# Patient Record
Sex: Male | Born: 1965 | Race: White | Hispanic: No | Marital: Married | Smoking: Former smoker
Health system: Southern US, Community
[De-identification: ages and names within clinical notes are randomized; demographics above are authoritative.]

## PROBLEM LIST (undated history)

## (undated) DIAGNOSIS — F419 Anxiety disorder, unspecified: Secondary | ICD-10-CM

## (undated) DIAGNOSIS — M48062 Spinal stenosis, lumbar region with neurogenic claudication: Secondary | ICD-10-CM

## (undated) DIAGNOSIS — F32A Depression, unspecified: Secondary | ICD-10-CM

## (undated) DIAGNOSIS — F329 Major depressive disorder, single episode, unspecified: Secondary | ICD-10-CM

## (undated) DIAGNOSIS — M5416 Radiculopathy, lumbar region: Secondary | ICD-10-CM

## (undated) DIAGNOSIS — M545 Low back pain, unspecified: Secondary | ICD-10-CM

## (undated) DIAGNOSIS — R55 Syncope and collapse: Secondary | ICD-10-CM

## (undated) DIAGNOSIS — Z950 Presence of cardiac pacemaker: Secondary | ICD-10-CM

## (undated) DIAGNOSIS — M1990 Unspecified osteoarthritis, unspecified site: Secondary | ICD-10-CM

## (undated) DIAGNOSIS — M25512 Pain in left shoulder: Secondary | ICD-10-CM

## (undated) DIAGNOSIS — M25522 Pain in left elbow: Secondary | ICD-10-CM

## (undated) DIAGNOSIS — R001 Bradycardia, unspecified: Secondary | ICD-10-CM

## (undated) DIAGNOSIS — M51369 Other intervertebral disc degeneration, lumbar region without mention of lumbar back pain or lower extremity pain: Secondary | ICD-10-CM

## (undated) DIAGNOSIS — M25521 Pain in right elbow: Secondary | ICD-10-CM

## (undated) DIAGNOSIS — M5136 Other intervertebral disc degeneration, lumbar region: Secondary | ICD-10-CM

## (undated) HISTORY — PX: TONSILLECTOMY: SUR1361

## (undated) HISTORY — DX: Other intervertebral disc degeneration, lumbar region without mention of lumbar back pain or lower extremity pain: M51.369

## (undated) HISTORY — PX: COLONOSCOPY: SHX174

## (undated) HISTORY — PX: HAND SURGERY: SHX662

## (undated) HISTORY — DX: Syncope and collapse: R55

## (undated) HISTORY — DX: Pain in right elbow: M25.521

## (undated) HISTORY — DX: Radiculopathy, lumbar region: M54.16

## (undated) HISTORY — DX: Pain in left shoulder: M25.512

## (undated) HISTORY — DX: Pain in right elbow: M25.522

## (undated) HISTORY — DX: Low back pain, unspecified: M54.50

## (undated) HISTORY — DX: Other intervertebral disc degeneration, lumbar region: M51.36

---
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
## 1997-09-24 HISTORY — PX: SHOULDER SURGERY: SHX246

## 1998-01-29 ENCOUNTER — Emergency Department (HOSPITAL_COMMUNITY): Admission: EM | Admit: 1998-01-29 | Discharge: 1998-01-29 | Payer: Self-pay | Admitting: *Deleted

## 2003-01-28 ENCOUNTER — Emergency Department (HOSPITAL_COMMUNITY): Admission: EM | Admit: 2003-01-28 | Discharge: 2003-01-29 | Payer: Self-pay | Admitting: Emergency Medicine

## 2003-01-29 ENCOUNTER — Encounter: Payer: Self-pay | Admitting: Emergency Medicine

## 2003-02-06 ENCOUNTER — Emergency Department (HOSPITAL_COMMUNITY): Admission: EM | Admit: 2003-02-06 | Discharge: 2003-02-06 | Payer: Self-pay

## 2007-09-25 DIAGNOSIS — R55 Syncope and collapse: Secondary | ICD-10-CM

## 2007-09-25 HISTORY — DX: Syncope and collapse: R55

## 2007-10-17 ENCOUNTER — Encounter: Payer: Self-pay | Admitting: Emergency Medicine

## 2007-10-17 ENCOUNTER — Inpatient Hospital Stay (HOSPITAL_COMMUNITY): Admission: EM | Admit: 2007-10-17 | Discharge: 2007-10-22 | Payer: Self-pay | Admitting: Cardiology

## 2007-10-18 ENCOUNTER — Encounter (INDEPENDENT_AMBULATORY_CARE_PROVIDER_SITE_OTHER): Payer: Self-pay | Admitting: Cardiology

## 2007-10-20 ENCOUNTER — Encounter (INDEPENDENT_AMBULATORY_CARE_PROVIDER_SITE_OTHER): Payer: Self-pay | Admitting: Cardiology

## 2007-10-20 HISTORY — PX: CARDIAC CATHETERIZATION: SHX172

## 2007-10-21 HISTORY — PX: PACEMAKER INSERTION: SHX728

## 2008-02-04 ENCOUNTER — Emergency Department (HOSPITAL_COMMUNITY): Admission: EM | Admit: 2008-02-04 | Discharge: 2008-02-04 | Payer: Self-pay | Admitting: Emergency Medicine

## 2010-07-14 ENCOUNTER — Encounter: Admission: RE | Admit: 2010-07-14 | Discharge: 2010-07-14 | Payer: Self-pay | Admitting: Orthopedic Surgery

## 2011-02-06 NOTE — Cardiovascular Report (Signed)
Theodore, [name] NO.:  [id number]   MEDICAL RECORD NO.:  [id number]          PATIENT TYPE:  INP   LOCATION:  4738                         FACILITY:  [facility]   PHYSICIAN:  Madaline Savage, M.D.DATE OF BIRTH:  06/17/1966   DATE OF PROCEDURE:  10/20/2007  DATE OF DISCHARGE:                            CARDIAC CATHETERIZATION   PROCEDURES PERFORMED:  1. Selective coronary angiography by Judkins technique.  2. Retrograde left heart catheterization.  3. Left ventricular angiography.   COMPLICATIONS:  None.   ENTRY SITE:  Right femoral artery.   DYE USED:  Omnipaque.   PATIENT PROFILE:  [name] is a 45 year old gentleman whose mother has  had coronary artery bypass grafting.  The patient himself has had seven  to nine episodes of syncope over the last 8-10 years.  He entered the  [facility] Emergency Room with bradycardia at a rate  of 30 a minute, but we have no records of that.  He has had multiple  other syncopal episodes associated with a chest discomfort and now two  to three days after his initial presentation with syncope, he still  complains of a fleeting sharp pain.  The patient does appear anxious.  He has a history of tobacco use.  He is a Corporate investment banker.  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   RESULTS:  PRESSURES:  Left ventricular pressure was 120/2, end-diastolic  pressure 18.  Central aortic pressure 110/74 with a mean of 90.  There  was a 10 mm gradient across the aortic valve by pullback technique which  still is within normal range.   ANGIOGRAPHIC RESULTS:  The patient's coronary arteries were  angiographically patent throughout.  No lesions noted.  Left ventricular  ejection fraction was 60%.   FINAL IMPRESSION:  1. Multiple syncopal episodes.  2. Chest pain associated with syncopal episodes.  3. Family history of coronary artery disease.  4. Normal left ventricular systolic function.  5. Normal coronary  arteries.   PLAN:  The patient will be further assessed for his syncopal episode in  terms of deciding whether he needs a pacemaker.           ______________________________  Madaline Savage, M.D.     WHG/MEDQ  D:  10/20/2007  T:  10/20/2007  Job:  [id number]

## 2011-02-06 NOTE — Discharge Summary (Signed)
NAMEGUSTAVUS, HASKIN NO.:  [id number]   MEDICAL RECORD NO.:  [id number]          PATIENT TYPE:  INP   LOCATION:  4738                         FACILITY:  [facility]   PHYSICIAN:  Madaline Savage, M.D.DATE OF BIRTH:  11-27-1965   DATE OF ADMISSION:  10/17/2007  DATE OF DISCHARGE:  10/22/2007                               DISCHARGE SUMMARY   DISCHARGE DIAGNOSES:  1. Syncope secondary to bradycardia.      a.     History of frequent episodes of syncope beginning at 45       years of age, usually associated with some type of vagal issue but       he does completely lose consciousness.  2. Permanent pacemaker placement by Dr. Lenise Herald, 10/21/2007, a      Medtronic Adapta, serial number [id number].  3. Normal coronary arteries, normal LV function by cardiac      catheterization by Dr. Madaline Savage.  4. Tobacco abuse.  5. Family history of coronary artery disease.   DISCHARGE CONDITION:  Improved.   PROCEDURE:  Combined left heart cath by Dr. Elsie Lincoln, 10/20/2007, with  normal cords, normal LV function, no MR, 10/21/2007, permanent  transvenous pacemaker placed by Dr. Lenise Herald, DDDR mode.   DISCHARGE MEDICATIONS:  1. Chantix, take as directed. Starter pack was given and prescription      for starter pack.  2. Xanax 0.25 mg, 1 tablet every 8 hours if needed for anxiety related      to no tobacco.   DISCHARGE INSTRUCTIONS:  1. No work until he is seen by Dr. Elsie Lincoln.  2. Increase activity slowly Follow pacemaker discharge instruction      sheets on arm movements, showers, etc.  3. See Dr. Juleen China in [place] for pacer sight check on 10/29/2007      at 10:30 a.m.  4. Follow up with Dr. Elsie Lincoln in [place], 11/10/2007 at 11:45 a.m.   HISTORY OF PRESENT ILLNESS:  This is a 45 year old white male received  from [facility] on 10/17/2007 after experiencing 2 episodes of  syncope on that day. Initially, he was sitting at his computer and  suddenly felt like he was going to pass out. Everything started getting  black. He felt a pain and tightness in his epigastric region. He laid  down on the floor then proceeded to get up and walk outside. He leaned  on the rail of the porch and then passed out. His wife was with him. He  became pale and diaphoretic. She called 911 and then while in the  ambulance en route to [facility], he again had a syncopal episode. Heart  rate had dropped to 34. There were several more episodes during the  evening of admission when he felt like he might pass out but he never  lost consciousness at those times. He has had no chest pain at rest or  exertion, recent upper respiratory infection with tightness in his chest  on deep inspiration. He had been using Mucinex but stopped that 2 days  prior to admission. He denied any tachycardia.  The patient has had previous episodes of syncope, the first when he was  45 years old, had severe chest pain, thought it was a muscle spasm but  did experience the syncope with pain and another episode when he was  hearing about a hernia procedure, he again had syncope. Also, his wife  says he does snore and has episodes of apnea. In the ER he had no chest  discomfort, no weakness.   PAST MEDICAL HISTORY:  Syncope as described, history of tobacco use.   FAMILY HISTORY:  Mother had coronary disease in her early 45's, had  bypass, also diabetic. Sister is diabetic.   SOCIAL HISTORY:  Married with 2 children, smokes 1/2 pack to 1 pack per  day, no alcohol use. He was recently laid off from a job in  [employer].   PHYSICAL EXAM AT DISCHARGE:  Blood pressure ranged  108/64 to 120/64,  pulses in the 60's, respirations 20, temperature 97.9, saturation room  air 95% at discharge.  Chest x-ray prior to procedure no acute disease and no change in his  chest x-ray. Followup post pacemaker on 10/22/2007, no pneumothorax.  Pacing leads were in appropriate places.    LABORATORY DATA:  Hemoglobin 15.6, hematocrit 45.3, WBC 8.3, platelets  133, MCV 90. Chemistries- sodium 139, potassium 3.9, chloride 109, C02,  25, BUN 12, creatinine 1.03 glucose 108. Coag's- pro time 14, INR 1.1,  PTT 39. Liver enzymes were normal. Cardiac enzymes: CK's ranged 49-56,  MB's 0.7 to 0.8, troponin 0.02 to 0.01, all negative for MI. Total  cholesterol 135, LDL 92, HDL 23, total triglycerides 148.   EKG is __________ sinus rhythm, normal EKG. On the rhythm strip heart  rate was in the 50's. Followup on the 24th, sinus rhythm, sinus  arrhythmia, heart rate was in the 70's. I do have rhythm strips on the  26th with a 2D echo. He was having junctional escape rhythm. Heart rate  drops down to 36 and up to 4.8 second pause just applying pressure to  the chest. His syncopal episodes are brought on by pain of any type,  lifting, laughter, applying Doppler ultrasound to chest wall and  descriptions of surgical procedures. He had been scheduled for a tilt  study. It was discontinued and pacemaker was applied.   Post pacemaker EKG,  sinus rhythm and no acute changes.   He does have atrial pacing and ventricular sensing on followup EKG.   HOSPITAL COURSE:  The patient was admitted by Dr. Elsie Lincoln on call on  10/17/2007 secondary to patient's syncope and bradycardia. He was  admitted to monitored floor. Cardiac enzymes were negative. The patient  underwent cardiac cath on the 26th with normal coronary arteries. He  then, on the 26th late in the day, just applying the probe to the  patient's chest, he went into a junctional rhythm and it was felt he  should undergo permanent pacemaker at that point, especially with  multiple year history of syncopal episodes. He did have the pacemaker  placed, did well and by 10/22/2007 chest x-ray showed no pneumothorax.  He was stable and ambulating the fall without difficulty. He was  discharged home and would follow up as an outpatient.       Darcella Gasman. Annie Paras, N.P.    ______________________________  Madaline Savage, M.D.   LRI/MEDQ  D:  10/22/2007  T:  10/23/2007  Job:  [id number]   cc:   Madaline Savage, M.D.  [facility]

## 2011-02-06 NOTE — Op Note (Signed)
NAMEFERLANDO, Theodore [name] NO.:  [id number]   MEDICAL RECORD NO.:  [id number]          PATIENT TYPE:  INP   LOCATION:  4738                         FACILITY:  [facility]   PHYSICIAN:  Darlin Priestly, MD  DATE OF BIRTH:  08-Apr-1966   DATE OF PROCEDURE:  10/17/2007  DATE OF DISCHARGE:                               OPERATIVE REPORT   PROCEDURE:  Insertion of a Medtronic Adapta-L generator, model ADDRL1,  serial number [id number], with active atrial and ventricular leads.   ATTENDING:  Darlin Priestly, M.D.   COMPLICATIONS:  None.   INDICATIONS:  Mr. Mahmood is a 45 year old male patient of Dr. Lavonne Chick and Dr. Greta Doom with a history of recurrent syncopal  episodes over the last 8-10 years.  He presented to [facility]  with bradycardia rates in the 30s.  He was subsequently transferred to  [facility] and underwent cardiac catheterization revealing  noncritical CAD with normal EF.  While here on telemetry, he did develop  a 4.8 second pause during a routine transthoracic echocardiogram.  He is  now referred for pacer implant secondary to recurrent syncopal episodes  with sinus node dysfunction.   DESCRIPTION OF OPERATION:  After giving informed written consent, the  patient was brought to the cardiac cath lab where the left chest was  shaved, prepped and draped in a sterile fashion.  A line was  established.  1% lidocaine was used to anesthetize the left mid  subclavicular area.  Next, a 3 cm horizontal mid infraclavicular  incision was carried out and hemostasis obtained with electrocautery.  Blunt dissection was used to carry this down to the left pectoral fascia  and a 3 by 4 cm pocket was then created over the left pectoral fascia.  Again, hemostasis was obtained with electrocautery.  The left subclavian  vein was then easily entered x2 with two retained guidewires.  Four silk  sutures were then placed at the base of the retained  guidewires  anchoring this to the pectoral fascia.  Over the first retained  guidewire, a 7 French dilating sheath was then easily passed in the left  subclavian vein and the dilator and guide were removed.  Through this, a  58 cm active Medtronic lead, model number 5076, serial number  [id number], was then placed in the right atrium and the peel away sheath  was removed.  Over the second retained guidewire, 52 cm active Medtronic  lead model 5076, serial [id number], was then positioned in the right  atrium and the peel away sheath was removed.  A preformed J-stylet was  then positioned in the ventricular lead and the ventricular lead allowed  to prolapse in the tricuspid valve.  This lead was then positioned in  the RV apex.  We were able to sense R waves at 13 mV and capture 2  volts.  The screw was then extended and thresholds determined.  R-waves  measured 17.9 volts, impedance 980 ohms, threshold was 0.6 volts at 0.5  milliseconds.  Current was 0.5 mA.  10 volts were negative for  diaphragmatic stimulation.  The preformed atrial stylet was then  positioned in the atrial lead and the atrial lead was then positioned in  the right atrial appendage.  We were able to sense P-waves in the 5 mV  range and capture 2 volts.  The screw was then extended and, again,  thresholds were determined.  P-waves were measured at 5.2 mV.  Impedance  893 ohms.  Threshold in the atrium was 0.7 volts at 0.5 milliseconds.  Current was 1.1 mA.  Again, 10 volts were negative for diaphragmatic  stimulation.  These leads were sutured in place with two silk sutures  per lead to anchor these to the pectoral fascia.  The pocket was then  copiously irrigated with 1% Kanamycin solution.  Again, hemostasis was  confirmed.  The leads were then connected in a serial fashion to a  Medtronic Adapta-L generator, model number ADDRL1, serial number  [id number].  Head screws were tightened and pacing was confirmed.  The   generator leads were then delivered to the pocket.  A single silk suture  was placed in the superior aspect of the pocket.  The header was then  secured with a silk suture.  The subcutaneous layer was closed using 2-0  Vicryl.  The skin was then closed using 4-0 Vicryl.  Steri-Strips were  applied.  The patient was transferred to the recovery room in stable  condition.   CONCLUSIONS:  Successful implant of a Medtronic Adapta L, model number  ADDRL1, serial number [id number], with active atrial and ventricular  leads.      Darlin Priestly, MD  Electronically Signed     RHM/MEDQ  D:  10/21/2007  T:  10/21/2007  Job:  [id number]   cc:   Madaline Savage, M.D.

## 2011-06-15 LAB — PROTIME-INR
INR: 1.1
INR: 1.1
Prothrombin Time: 14
Prothrombin Time: 14.2

## 2011-06-15 LAB — DIFFERENTIAL
Basophils Absolute: 0.1
Basophils Relative: 1
Eosinophils Absolute: 0.1
Eosinophils Relative: 2
Lymphocytes Relative: 29
Lymphs Abs: 2.3
Monocytes Absolute: 0.6
Monocytes Relative: 7
Neutro Abs: 4.9
Neutrophils Relative %: 61

## 2011-06-15 LAB — CBC
HCT: 45.3
HCT: 46.4
HCT: 46.9
Hemoglobin: 15.6
Hemoglobin: 15.9
Hemoglobin: 15.9
MCHC: 33.9
MCHC: 34.3
MCHC: 34.4
MCV: 88.3
MCV: 90
MCV: 90.4
Platelets: 133 — ABNORMAL LOW
Platelets: 161
Platelets: 165
RBC: 5.04
RBC: 5.18
RBC: 5.25
RDW: 12.5
RDW: 12.6
RDW: 12.6
WBC: 11.4 — ABNORMAL HIGH
WBC: 8.1
WBC: 8.3

## 2011-06-15 LAB — TSH
TSH: 2.379
TSH: 3.172

## 2011-06-15 LAB — APTT
aPTT: 34
aPTT: 39 — ABNORMAL HIGH

## 2011-06-15 LAB — COMPREHENSIVE METABOLIC PANEL
ALT: 24
AST: 20
Albumin: 3.6
Alkaline Phosphatase: 62
BUN: 13
CO2: 27
Calcium: 8.8
Chloride: 105
Creatinine, Ser: 1.21
GFR calc Af Amer: >60
GFR calc non Af Amer: >60
Glucose, Bld: 243 — ABNORMAL HIGH
Potassium: 3.7
Sodium: 138
Total Bilirubin: 0.4
Total Protein: 6.5

## 2011-06-15 LAB — BASIC METABOLIC PANEL
BUN: 12
BUN: 12
CO2: 25
CO2: 27
Calcium: 8.5
Calcium: 8.9
Chloride: 106
Chloride: 109
Creatinine, Ser: 0.96
Creatinine, Ser: 1.03
GFR calc Af Amer: >60
GFR calc Af Amer: >60
GFR calc non Af Amer: >60
GFR calc non Af Amer: >60
Glucose, Bld: 108 — ABNORMAL HIGH
Glucose, Bld: 120 — ABNORMAL HIGH
Potassium: 3.9
Potassium: 4.1
Sodium: 139
Sodium: 141

## 2011-06-15 LAB — CARDIAC PANEL(CRET KIN+CKTOT+MB+TROPI)
CK, MB: 0.7
CK, MB: 0.7
CK, MB: 0.8
Relative Index: INVALID
Relative Index: INVALID
Relative Index: INVALID
Total CK: 49
Total CK: 54
Total CK: 56
Troponin I: 0.01
Troponin I: 0.02
Troponin I: 0.02

## 2011-06-15 LAB — LIPID PANEL
Cholesterol: 145
HDL: 23 — ABNORMAL LOW
LDL Cholesterol: 92
Total CHOL/HDL Ratio: 6.3
Triglycerides: 148
VLDL: 30

## 2011-06-15 LAB — D-DIMER, QUANTITATIVE: D-Dimer, Quant: 0.25

## 2011-06-15 LAB — POCT CARDIAC MARKERS
CKMB, poc: <1 — ABNORMAL LOW
CKMB, poc: <1 — ABNORMAL LOW
Myoglobin, poc: 37.8
Myoglobin, poc: 82.8
Operator id: 132501
Operator id: 286941
Troponin i, poc: 0.07 — ABNORMAL HIGH
Troponin i, poc: <0.05

## 2011-06-15 LAB — HEMOGLOBIN A1C
Hgb A1c MFr Bld: 5.7
Hgb A1c MFr Bld: 5.7
Mean Plasma Glucose: 126
Mean Plasma Glucose: 126

## 2011-10-20 ENCOUNTER — Encounter (HOSPITAL_COMMUNITY): Payer: Self-pay

## 2011-10-20 ENCOUNTER — Inpatient Hospital Stay (HOSPITAL_COMMUNITY)
Admission: EM | Admit: 2011-10-20 | Discharge: 2011-10-22 | DRG: 294 | Disposition: A | Discharge status: Home / Self Care | Payer: BC Managed Care – PPO | Attending: Internal Medicine | Admitting: Internal Medicine

## 2011-10-20 ENCOUNTER — Emergency Department (HOSPITAL_COMMUNITY): Payer: BC Managed Care – PPO

## 2011-10-20 DIAGNOSIS — Z79899 Other long term (current) drug therapy: Secondary | ICD-10-CM

## 2011-10-20 DIAGNOSIS — IMO0001 Reserved for inherently not codable concepts without codable children: Principal | ICD-10-CM | POA: Diagnosis present

## 2011-10-20 DIAGNOSIS — R739 Hyperglycemia, unspecified: Secondary | ICD-10-CM

## 2011-10-20 DIAGNOSIS — F341 Dysthymic disorder: Secondary | ICD-10-CM | POA: Diagnosis present

## 2011-10-20 DIAGNOSIS — R001 Bradycardia, unspecified: Secondary | ICD-10-CM | POA: Diagnosis not present

## 2011-10-20 DIAGNOSIS — E781 Pure hyperglyceridemia: Secondary | ICD-10-CM | POA: Diagnosis present

## 2011-10-20 DIAGNOSIS — E1165 Type 2 diabetes mellitus with hyperglycemia: Secondary | ICD-10-CM | POA: Diagnosis present

## 2011-10-20 DIAGNOSIS — IMO0002 Reserved for concepts with insufficient information to code with codable children: Secondary | ICD-10-CM

## 2011-10-20 DIAGNOSIS — F418 Other specified anxiety disorders: Secondary | ICD-10-CM | POA: Diagnosis present

## 2011-10-20 DIAGNOSIS — Z950 Presence of cardiac pacemaker: Secondary | ICD-10-CM

## 2011-10-20 HISTORY — DX: Presence of cardiac pacemaker: Z95.0

## 2011-10-20 HISTORY — DX: Bradycardia, unspecified: R00.1

## 2011-10-20 HISTORY — DX: Major depressive disorder, single episode, unspecified: F32.9

## 2011-10-20 HISTORY — DX: Anxiety disorder, unspecified: F41.9

## 2011-10-20 HISTORY — DX: Depression, unspecified: F32.A

## 2011-10-20 LAB — CBC
HCT: 46.2 % (ref 39.0–52.0)
Hemoglobin: 16.9 g/dL (ref 13.0–17.0)
MCH: 30.7 pg (ref 26.0–34.0)
MCHC: 36.6 g/dL — ABNORMAL HIGH (ref 30.0–36.0)
MCV: 83.8 fL (ref 78.0–100.0)
Platelets: 148 10*3/uL — ABNORMAL LOW (ref 150–400)
RBC: 5.51 MIL/uL (ref 4.22–5.81)
RDW: 11.5 % (ref 11.5–15.5)
WBC: 10.6 10*3/uL — ABNORMAL HIGH (ref 4.0–10.5)

## 2011-10-20 LAB — BLOOD GAS, VENOUS
Acid-Base Excess: 0.2 mmol/L (ref 0.0–2.0)
Bicarbonate: 24.6 mEq/L — ABNORMAL HIGH (ref 20.0–24.0)
FIO2: 21 %
O2 Saturation: 75.1 %
Patient temperature: 37
TCO2: 20.6 mmol/L (ref 0–100)
pCO2, Ven: 41.5 mmHg — ABNORMAL LOW (ref 45.0–50.0)
pH, Ven: 7.389 — ABNORMAL HIGH (ref 7.250–7.300)
pO2, Ven: 39.5 mmHg (ref 30.0–45.0)

## 2011-10-20 LAB — GLUCOSE, CAPILLARY
Glucose-Capillary: 222 mg/dL — ABNORMAL HIGH (ref 70–99)
Glucose-Capillary: 227 mg/dL — ABNORMAL HIGH (ref 70–99)
Glucose-Capillary: 268 mg/dL — ABNORMAL HIGH (ref 70–99)
Glucose-Capillary: 279 mg/dL — ABNORMAL HIGH (ref 70–99)
Glucose-Capillary: 381 mg/dL — ABNORMAL HIGH (ref 70–99)
Glucose-Capillary: 454 mg/dL — ABNORMAL HIGH (ref 70–99)
Glucose-Capillary: >600 mg/dL (ref 70–99)

## 2011-10-20 LAB — URINALYSIS, ROUTINE W REFLEX MICROSCOPIC
Bilirubin Urine: NEGATIVE
Glucose, UA: >1000 mg/dL — AB
Hgb urine dipstick: NEGATIVE
Ketones, ur: 15 mg/dL — AB
Leukocytes, UA: NEGATIVE
Nitrite: NEGATIVE
Protein, ur: NEGATIVE mg/dL
Specific Gravity, Urine: <1.005 — ABNORMAL LOW (ref 1.005–1.030)
Urobilinogen, UA: 0.2 mg/dL (ref 0.0–1.0)
pH: 6 (ref 5.0–8.0)

## 2011-10-20 LAB — DIFFERENTIAL
Basophils Absolute: 0 10*3/uL (ref 0.0–0.1)
Basophils Relative: 0 % (ref 0–1)
Eosinophils Absolute: 0.2 10*3/uL (ref 0.0–0.7)
Eosinophils Relative: 2 % (ref 0–5)
Lymphocytes Relative: 20 % (ref 12–46)
Lymphs Abs: 2.1 10*3/uL (ref 0.7–4.0)
Monocytes Absolute: 0.6 10*3/uL (ref 0.1–1.0)
Monocytes Relative: 6 % (ref 3–12)
Neutro Abs: 7.7 10*3/uL (ref 1.7–7.7)
Neutrophils Relative %: 72 % (ref 43–77)

## 2011-10-20 LAB — URINE MICROSCOPIC-ADD ON

## 2011-10-20 MED ORDER — ACETAMINOPHEN 650 MG RE SUPP: 650.0000 mg | Freq: Four times a day (QID) | RECTAL | Status: DC | PRN

## 2011-10-20 MED ORDER — ENOXAPARIN SODIUM 40 MG/0.4ML ~~LOC~~ SOLN
40.0000 mg | SUBCUTANEOUS | Status: DC
  Administered 2011-10-20 – 2011-10-21 (×2): 40 mg via SUBCUTANEOUS
  Filled 2011-10-20 (×2): qty 0.4

## 2011-10-20 MED ORDER — SODIUM CHLORIDE 0.9 % IV SOLN
Freq: Once | INTRAVENOUS | Status: AC
  Administered 2011-10-20: 20:00:00 via INTRAVENOUS

## 2011-10-20 MED ORDER — PROPRANOLOL HCL ER 60 MG PO CP24
120.0000 mg | ORAL_CAPSULE | Freq: Every day | ORAL | Status: DC
  Administered 2011-10-21 – 2011-10-22 (×2): 120 mg via ORAL
  Filled 2011-10-20 (×2): qty 1

## 2011-10-20 MED ORDER — DEXTROSE 50 % IV SOLN: 25.0000 mL | INTRAVENOUS | Status: DC | PRN

## 2011-10-20 MED ORDER — SODIUM CHLORIDE 0.9 % IV SOLN
INTRAVENOUS | Status: DC
  Administered 2011-10-20: 18:00:00 via INTRAVENOUS
  Filled 2011-10-20: qty 1

## 2011-10-20 MED ORDER — POLYETHYLENE GLYCOL 3350 17 G PO PACK: 17.0000 g | PACK | Freq: Every day | ORAL | Status: DC | PRN

## 2011-10-20 MED ORDER — BISACODYL 10 MG RE SUPP: 10.0000 mg | Freq: Every day | RECTAL | Status: DC | PRN

## 2011-10-20 MED ORDER — SODIUM CHLORIDE 0.9 % IV SOLN
INTRAVENOUS | Status: DC
  Filled 2011-10-20: qty 1

## 2011-10-20 MED ORDER — ALPRAZOLAM 0.5 MG PO TABS: 0.5000 mg | ORAL_TABLET | Freq: Two times a day (BID) | ORAL | Status: DC | PRN

## 2011-10-20 MED ORDER — ASPIRIN EC 81 MG PO TBEC
81.0000 mg | DELAYED_RELEASE_TABLET | Freq: Every day | ORAL | Status: DC
  Administered 2011-10-20 – 2011-10-22 (×3): 81 mg via ORAL
  Filled 2011-10-20 (×3): qty 1

## 2011-10-20 MED ORDER — MAGNESIUM CITRATE PO SOLN: 1.0000 | Freq: Once | ORAL | Status: AC | PRN

## 2011-10-20 MED ORDER — TRAZODONE HCL 50 MG PO TABS: 25.0000 mg | ORAL_TABLET | Freq: Every evening | ORAL | Status: DC | PRN

## 2011-10-20 MED ORDER — ACETAMINOPHEN 325 MG PO TABS
650.0000 mg | ORAL_TABLET | Freq: Four times a day (QID) | ORAL | Status: DC | PRN
  Administered 2011-10-21: 650 mg via ORAL
  Filled 2011-10-20: qty 2

## 2011-10-20 MED ORDER — ONDANSETRON HCL 4 MG PO TABS: 4.0000 mg | ORAL_TABLET | Freq: Four times a day (QID) | ORAL | Status: DC | PRN

## 2011-10-20 MED ORDER — DEXTROSE-NACL 5-0.45 % IV SOLN
INTRAVENOUS | Status: DC
  Administered 2011-10-20: 23:00:00 via INTRAVENOUS

## 2011-10-20 MED ORDER — OXYCODONE HCL 5 MG PO TABS
5.0000 mg | ORAL_TABLET | ORAL | Status: DC | PRN
  Administered 2011-10-21: 5 mg via ORAL
  Filled 2011-10-20: qty 1

## 2011-10-20 MED ORDER — ONDANSETRON HCL 4 MG/2ML IJ SOLN: 4.0000 mg | Freq: Four times a day (QID) | INTRAMUSCULAR | Status: DC | PRN

## 2011-10-20 MED ORDER — SODIUM CHLORIDE 0.9 % IV BOLUS (SEPSIS)
2000.0000 mL | Freq: Once | INTRAVENOUS | Status: AC
  Administered 2011-10-20: 1000 mL via INTRAVENOUS

## 2011-10-20 MED ORDER — HYDROMORPHONE HCL PF 1 MG/ML IJ SOLN: 0.5000 mg | INTRAMUSCULAR | Status: DC | PRN

## 2011-10-20 MED ORDER — SODIUM CHLORIDE 0.9 % IV SOLN: INTRAVENOUS | Status: DC

## 2011-10-20 MED ORDER — ONDANSETRON HCL 4 MG/2ML IJ SOLN
4.0000 mg | Freq: Once | INTRAMUSCULAR | Status: AC
  Administered 2011-10-20: 4 mg via INTRAVENOUS
  Filled 2011-10-20: qty 2

## 2011-10-20 MED ORDER — INSULIN REGULAR HUMAN 100 UNIT/ML IJ SOLN
INTRAMUSCULAR | Status: AC
  Filled 2011-10-20: qty 3

## 2011-10-20 MED ORDER — ESCITALOPRAM OXALATE 10 MG PO TABS
10.0000 mg | ORAL_TABLET | Freq: Every day | ORAL | Status: DC
  Administered 2011-10-21 – 2011-10-22 (×2): 10 mg via ORAL
  Filled 2011-10-20 (×2): qty 1

## 2011-10-20 NOTE — ED Notes (Signed)
Pt presents with blurred vision, hyperglycemia, oliguria, increased thirst, and decreased apatite. Pt denies being diabetic. Pt also reports having fruity odor to breath.

## 2011-10-20 NOTE — ED Provider Notes (Signed)
Scribed for Dayton Bailiff, MD, the patient was seen in room APA04/APA04 . This chart was scribed by Ellie Lunch.   CSN: [id number]  Arrival date & time 10/20/11  1613   First MD Initiated Contact with Patient 10/20/11 1637      Chief Complaint  Patient presents with  . Blurred Vision    Increased thurst, increased urination  . Hyperglycemia    (Consider location/radiation/quality/duration/timing/severity/associated sxs/prior treatment) HPI Theodore [name] is a 46 y.o. male who presents to the Emergency Department complaining of 1 week of blurred vision with associated dry mouth, decreased appetite, oliguria, increased thirst. This problem is new. Symptoms have been constant since onset.  Pt denies vomiting. Has had slight nausea. No h/o diabetes. Family h/o of DM. There are no other associated symptoms and no other alleviating or aggravating factors.    PCP Dr. Derinda Sis  Past Medical History  Diagnosis Date  . Pacemaker   . Anxiety   . Depression   . Bradycardia     Past Surgical History  Procedure Date  . Pacemaker insertion   . Shoulder surgery   . Hand surgery     No family history on file.  History  Substance Use Topics  . Smoking status: Never Smoker   . Smokeless tobacco: Not on file  . Alcohol Use: No     Review of Systems  Constitutional: Positive for appetite change. Negative for fever, activity change and fatigue.  HENT: Negative for congestion, sore throat, rhinorrhea, neck pain and neck stiffness.   Eyes: Positive for visual disturbance.  Respiratory: Negative for cough and wheezing.   Cardiovascular: Negative for chest pain and palpitations.  Gastrointestinal: Negative for nausea, vomiting and abdominal pain.  Genitourinary: Positive for frequency. Negative for dysuria, urgency and flank pain.  Skin: Negative for rash.  Neurological: Negative for dizziness, weakness, light-headedness, numbness and headaches.  All other systems reviewed and are  negative.    Allergies  Keflex  Home Medications   Current Outpatient Rx  Name Route Sig Dispense Refill  . ALPRAZOLAM 0.5 MG PO TABS Oral Take 0.5 mg by mouth every 6 (six) hours as needed. anxiety    . ESCITALOPRAM OXALATE 10 MG PO TABS Oral Take 10 mg by mouth daily.    Marland Kitchen PROPRANOLOL HCL ER 120 MG PO CP24 Oral Take 120 mg by mouth daily.      BP 113/99  Pulse 79  Temp(Src) 97.5 F (36.4 C) (Oral)  Resp 20  Ht 5\' 9"  (1.753 m)  Wt 188 lb (85.276 kg)  BMI 27.76 kg/m2  SpO2 97%  Physical Exam  Nursing note and vitals reviewed. Constitutional: He is oriented to person, place, and time. He appears well-developed and well-nourished. No distress.  HENT:  Head: Normocephalic and atraumatic.       Mm dry  Eyes: Conjunctivae and EOM are normal.  Neck: Normal range of motion. Neck supple.  Cardiovascular: Normal rate, regular rhythm and normal heart sounds.  Exam reveals no gallop and no friction rub.   No murmur heard. Pulmonary/Chest: Effort normal and breath sounds normal. No respiratory distress. He has no wheezes.  Abdominal: Soft. There is no tenderness.  Musculoskeletal: Normal range of motion. He exhibits no edema.       Pedal pulse intact  Neurological: He is alert and oriented to person, place, and time.  Skin: Skin is warm and dry.  Psychiatric: He has a normal mood and affect.    ED Course  Procedures (including critical  care time)  CRITICAL CARE Performed by: Dayton Bailiff   Total critical care time: 30 min  Critical care time was exclusive of separately billable procedures and treating other patients.  Critical care was necessary to treat or prevent imminent or life-threatening deterioration.  Critical care was time spent personally by me on the following activities: development of treatment plan with patient and/or surrogate as well as nursing, discussions with consultants, evaluation of patient's response to treatment, examination of patient, obtaining  history from patient or surrogate, ordering and performing treatments and interventions, ordering and review of laboratory studies, ordering and review of radiographic studies, pulse oximetry and re-evaluation of patient's condition.  DIAGNOSTIC STUDIES: Oxygen Saturation is 97% on room air, normal by my interpretation.    COORDINATION OF CARE:   Labs Reviewed  GLUCOSE, CAPILLARY - Abnormal; Notable for the following:    Glucose-Capillary >600 (*)    All other components within normal limits  CBC - Abnormal; Notable for the following:    WBC 10.6 (*)    MCHC 36.6 (*)    Platelets 148 (*)    All other components within normal limits  COMPREHENSIVE METABOLIC PANEL - Abnormal; Notable for the following:    Sodium 127 (*)    Chloride 89 (*)    Alkaline Phosphatase 164 (*)    GFR calc non Af Amer 84 (*)    All other components within normal limits  URINALYSIS, ROUTINE W REFLEX MICROSCOPIC - Abnormal; Notable for the following:    Specific Gravity, Urine <1.005 (*)    Glucose, UA >1000 (*)    Ketones, ur 15 (*)    All other components within normal limits  BLOOD GAS, VENOUS - Abnormal; Notable for the following:    pH, Ven 7.389 (*)    pCO2, Ven 41.5 (*)    Bicarbonate 24.6 (*)    All other components within normal limits  DIFFERENTIAL  URINE MICROSCOPIC-ADD ON   Dg Chest 2 View  10/20/2011  *RADIOLOGY REPORT*  Clinical Data: Hyperglycemia  CHEST - 2 VIEW  Comparison: Chest radiograph 02/04/2008  Findings: Left-sided pacemaker with two continuous leads overlies stable cardiac silhouette.  No effusion, infiltrate, or pneumothorax .  IMPRESSION: No acute cardiopulmonary process.  Original Report Authenticated By: Genevive Bi, M.D.    ED MEDICATIONS Medications  ondansetron [facility]) injection 4 mg   sodium chloride 0.9 % bolus 2,000 mL   insulin regular (NOVOLIN R,HUMULIN R) 1 Units/mL in sodium chloride 0.9 % 100 mL infusion (not administered)   6:18 PM EDP consult with  hospitalist Dr. Gwyndolyn Saxon. Agrees to admit.   1. Hyperglycemia   2. Diabetes mellitus       MDM  New-onset diabetes without evidence of DKA. Son have ketones in his urine an elevated glucose of approximately 700. Displacement glucose stabilize her with insulin drip. No hyponatremia on correction for hyperglycemia. Meter of his workup was relatively unremarkable. Discussed with the hospitalist who accepted the patient for admission.  I personally performed the services described in this documentation, which was scribed in my presence. The recorded information has been reviewed and considered.         Dayton Bailiff, MD 10/20/11 Rickey Primus

## 2011-10-20 NOTE — H&P (Signed)
PCP:   Karleen Hampshire, MD,. [facility]  Cardiologist: Dr. Salena Saner. with [facility]  Chief Complaint:  Feeling sick for one week  HPI: Theodore [name] is an 46 y.o. Caucasian male.   In fair physical health until noted excessive thirst for the past few months, and severe polyuria polydipsia and blurred vision for the past one week. Eventually patient came to the emergency room for the latter symptoms and his blood sugar was found to be greater than 700.  Denies any fevers cough or sore throat; denies any skin rash or infection; denies numbness or burning of the feet;  Mother and sister are diabetic mother also had heart disease.  The past 3-4 years patient has suffered with anxiety and depression related to the economy and job-related issues.  Patient is status post a pacemaker as of recurrent episodes of stress induced autonomic bradycardia,syncope and collapse.   Rewiew of Systems:  The patient denies anorexia, fever, weight loss,, vision loss, decreased hearing, hoarseness, chest pain, syncope, dyspnea on exertion, peripheral edema, balance deficits, hemoptysis, abdominal pain, melena, hematochezia, severe indigestion/heartburn, hematuria, incontinence, genital sores, muscle weakness, suspicious skin lesions, transient blindness, difficulty walking, depression, unusual weight change, abnormal bleeding, enlarged lymph nodes, angioedema, and breast masses.   Past Medical History  Diagnosis Date  . Pacemaker   . Anxiety   . Depression   . Bradycardia     Past Surgical History  Procedure Date  . Pacemaker insertion   . Shoulder surgery   . Hand surgery     Medications:  HOME MEDS: Prior to Admission medications   Medication Sig Start Date End Date Taking? Authorizing Provider  ALPRAZolam Prudy Feeler) 0.5 MG tablet Take 0.5 mg by mouth every 6 (six) hours as needed. anxiety   Yes Historical Provider, MD  escitalopram (LEXAPRO) 10 MG tablet Take 10 mg by mouth  daily.   Yes Historical Provider, MD  propranolol (INDERAL LA) 120 MG 24 hr capsule Take 120 mg by mouth daily.   Yes Historical Provider, MD     Allergies:  Allergies  Allergen Reactions  . Keflex Hives    Social History:   reports that he has never smoked. He does not have any smokeless tobacco history on file. He reports that he does not drink alcohol or use illicit drugs.  Family History: Family History  Problem Relation Age of Onset  . Diabetes Sister   . Diabetes Mother   . Coronary artery disease Mother      Physical Exam: Filed Vitals:   10/20/11 1712 10/20/11 1833 10/20/11 1919 10/20/11 2024  BP:  88/67 102/67 114/68  Pulse:  65 74 73  Temp:    98.2 F (36.8 C)  TempSrc:    Oral  Resp:  14 18 20   Height:      Weight:      SpO2: 96% 92% 94% 97%   Blood pressure 114/68, pulse 73, temperature 98.2 F (36.8 C), temperature source Oral, resp. rate 20, height 5\' 9"  (1.753 m), weight 85.276 kg (188 lb), SpO2 97.00%.  GEN:  Pleasant somewhat anxious, somewhat obese Caucasian man,  lying in the stretcher.; cooperative with exam PSYCH:  alert and oriented x4;; affect is appropriate HEENT: Mucous membranes pink and dry and anicteric; PERRLA; EOM intact; no cervical lymphadenopathy nor thyromegaly or carotid bruit; no JVD; neck thick due to sagging skin. Breasts:: Not examined CHEST WALL: No tenderness; left upper chest t pacemaker CHEST: Normal respiration, clear to auscultation bilaterally HEART:  Regular rate and rhythm; no murmurs rubs or gallops BACK: No kyphosis or scoliosis; no CVA tenderness ABDOMEN: Obese, soft non-tender; no masses, no organomegaly, normal abdominal bowel sounds; ; no intertriginous candida. Rectal Exam: Not done EXTREMITIES: No bone or joint deformity;no edema; no ulcerations. Genitalia: not examined PULSES: 2+ and symmetric SKIN: Normal hydration no rash or ulceration CNS: Cranial nerves 2-12 grossly intact no focal neurologic deficit     Labs & Imaging Results for orders placed during the hospital encounter of 10/20/11 (from the past 48 hour(s))  GLUCOSE, CAPILLARY     Status: Abnormal   Collection Time   10/20/11  4:19 PM      Component Value Range Comment   Glucose-Capillary >600 (*) 70 - 99 (mg/dL)   CBC     Status: Abnormal   Collection Time   10/20/11  4:53 PM      Component Value Range Comment   WBC 10.6 (*) 4.0 - 10.5 (K/uL)    RBC 5.51  4.22 - 5.81 (MIL/uL)    Hemoglobin 16.9  13.0 - 17.0 (g/dL)    HCT 16.1  09.6 - 04.5 (%)    MCV 83.8  78.0 - 100.0 (fL)    MCH 30.7  26.0 - 34.0 (pg)    MCHC 36.6 (*) 30.0 - 36.0 (g/dL)    RDW 40.9  81.1 - 91.4 (%)    Platelets 148 (*) 150 - 400 (K/uL)   DIFFERENTIAL     Status: Normal   Collection Time   10/20/11  4:53 PM      Component Value Range Comment   Neutrophils Relative 72  43 - 77 (%)    Neutro Abs 7.7  1.7 - 7.7 (K/uL)    Lymphocytes Relative 20  12 - 46 (%)    Lymphs Abs 2.1  0.7 - 4.0 (K/uL)    Monocytes Relative 6  3 - 12 (%)    Monocytes Absolute 0.6  0.1 - 1.0 (K/uL)    Eosinophils Relative 2  0 - 5 (%)    Eosinophils Absolute 0.2  0.0 - 0.7 (K/uL)    Basophils Relative 0  0 - 1 (%)    Basophils Absolute 0.0  0.0 - 0.1 (K/uL)   COMPREHENSIVE METABOLIC PANEL     Status: Abnormal   Collection Time   10/20/11  4:53 PM      Component Value Range Comment   Sodium 127 (*) 135 - 145 (mEq/L)    Potassium 4.6  3.5 - 5.1 (mEq/L)    Chloride 89 (*) 96 - 112 (mEq/L)    CO2 24  19 - 32 (mEq/L)    Glucose, Bld 704 (*) 70 - 99 (mg/dL) CORRECTED ON 78/29 AT 1959: PREVIOUSLY REPORTED AS REPEATED TO VERIFY CRITICAL RESULT CALLED TO, READ BACK BY AND VERIFIED WITH: WHITFIELD, D. AT 17:34PM ON 10/20/11 BY PRUITT, C.   BUN 22  6 - 23 (mg/dL)    Creatinine, Ser 5.62  0.50 - 1.35 (mg/dL)    Calcium 9.9  8.4 - 10.5 (mg/dL)    Total Protein 7.8  6.0 - 8.3 (g/dL)    Albumin 4.1  3.5 - 5.2 (g/dL)    AST 12  0 - 37 (U/L)    ALT 14  0 - 53 (U/L)    Alkaline Phosphatase 164  (*) 39 - 117 (U/L)    Total Bilirubin 0.6  0.3 - 1.2 (mg/dL)    GFR calc non Af Amer 84 (*) >90 (mL/min)  GFR calc Af Amer >90  >90 (mL/min)   BLOOD GAS, VENOUS     Status: Abnormal   Collection Time   10/20/11  5:08 PM      Component Value Range Comment   FIO2 21.00      pH, Ven 7.389 (*) 7.250 - 7.300     pCO2, Ven 41.5 (*) 45.0 - 50.0 (mmHg)    pO2, Ven 39.5  30.0 - 45.0 (mmHg)    Bicarbonate 24.6 (*) 20.0 - 24.0 (mEq/L)    TCO2 20.6  0 - 100 (mmol/L)    Acid-Base Excess 0.2  0.0 - 2.0 (mmol/L)    O2 Saturation 75.1      Patient temperature 37.0      Collection site RIGHT AC      Drawn by COLLECTED BY KRISTY BELTON      Sample type VENOUS     URINALYSIS, ROUTINE W REFLEX MICROSCOPIC     Status: Abnormal   Collection Time   10/20/11  5:30 PM      Component Value Range Comment   Color, Urine YELLOW  YELLOW     APPearance CLEAR  CLEAR     Specific Gravity, Urine <1.005 (*) 1.005 - 1.030     pH 6.0  5.0 - 8.0     Glucose, UA >1000 (*) NEGATIVE (mg/dL)    Hgb urine dipstick NEGATIVE  NEGATIVE     Bilirubin Urine NEGATIVE  NEGATIVE     Ketones, ur 15 (*) NEGATIVE (mg/dL)    Protein, ur NEGATIVE  NEGATIVE (mg/dL)    Urobilinogen, UA 0.2  0.0 - 1.0 (mg/dL)    Nitrite NEGATIVE  NEGATIVE     Leukocytes, UA NEGATIVE  NEGATIVE    URINE MICROSCOPIC-ADD ON     Status: Normal   Collection Time   10/20/11  5:30 PM      Component Value Range Comment   WBC, UA 0-2  <3 (WBC/hpf)   GLUCOSE, CAPILLARY     Status: Abnormal   Collection Time   10/20/11  6:27 PM      Component Value Range Comment   Glucose-Capillary 454 (*) 70 - 99 (mg/dL)    Comment 1 Documented in Chart      Comment 2 Notify RN     GLUCOSE, CAPILLARY     Status: Abnormal   Collection Time   10/20/11  7:32 PM      Component Value Range Comment   Glucose-Capillary 381 (*) 70 - 99 (mg/dL)    Dg Chest 2 View  1/61/0960  *RADIOLOGY REPORT*  Clinical Data: Hyperglycemia  CHEST - 2 VIEW  Comparison: Chest radiograph  02/04/2008  Findings: Left-sided pacemaker with two continuous leads overlies stable cardiac silhouette.  No effusion, infiltrate, or pneumothorax .  IMPRESSION: No acute cardiopulmonary process.  Original Report Authenticated By: Genevive Bi, M.D.      Assessment Present on Admission:  .Type II or unspecified type diabetes mellitus with unspecified complication, uncontrolled .Depression with anxiety   PLAN: This gentleman to the step down unit for glucose normalization on the glucose stabilizer protocol; Vigorous hydration; check electrolytes in the morning and replete as needed; Will need to be started on insulin for a few months and may need to get an endocrinology referral;  Initial discussed some of diabetic diabetic home management, and self maintenance. Continue his home medications but counsell on benzodiazepine use  Other plans as per orders.    Elhadji Pecore 10/20/2011, 8:38 PM

## 2011-10-20 NOTE — ED Notes (Signed)
Attempted to call report. Was advised nurse receiving pt will call this nurse back for report. 

## 2011-10-21 LAB — GLUCOSE, CAPILLARY
Glucose-Capillary: 100 mg/dL — ABNORMAL HIGH (ref 70–99)
Glucose-Capillary: 105 mg/dL — ABNORMAL HIGH (ref 70–99)
Glucose-Capillary: 143 mg/dL — ABNORMAL HIGH (ref 70–99)
Glucose-Capillary: 144 mg/dL — ABNORMAL HIGH (ref 70–99)
Glucose-Capillary: 240 mg/dL — ABNORMAL HIGH (ref 70–99)
Glucose-Capillary: 290 mg/dL — ABNORMAL HIGH (ref 70–99)
Glucose-Capillary: 336 mg/dL — ABNORMAL HIGH (ref 70–99)
Glucose-Capillary: 91 mg/dL (ref 70–99)

## 2011-10-21 LAB — BASIC METABOLIC PANEL
BUN: 16 mg/dL (ref 6–23)
CO2: 24 mEq/L (ref 19–32)
Calcium: 8.5 mg/dL (ref 8.4–10.5)
Chloride: 103 mEq/L (ref 96–112)
Creatinine, Ser: 1.01 mg/dL (ref 0.50–1.35)
GFR calc Af Amer: >90 mL/min (ref >90)
GFR calc non Af Amer: 88 mL/min — ABNORMAL LOW (ref >90)
Glucose, Bld: 113 mg/dL — ABNORMAL HIGH (ref 70–99)
Potassium: 3.5 mEq/L (ref 3.5–5.1)
Sodium: 138 mEq/L (ref 135–145)

## 2011-10-21 LAB — COMPREHENSIVE METABOLIC PANEL
ALT: 14 U/L (ref 0–53)
AST: 12 U/L (ref 0–37)
Albumin: 4.1 g/dL (ref 3.5–5.2)
Alkaline Phosphatase: 164 U/L — ABNORMAL HIGH (ref 39–117)
BUN: 22 mg/dL (ref 6–23)
CO2: 24 mEq/L (ref 19–32)
Calcium: 9.9 mg/dL (ref 8.4–10.5)
Chloride: 89 mEq/L — ABNORMAL LOW (ref 96–112)
Creatinine, Ser: 1.05 mg/dL (ref 0.50–1.35)
GFR calc Af Amer: >90 mL/min (ref >90)
GFR calc non Af Amer: 84 mL/min — ABNORMAL LOW (ref >90)
Glucose, Bld: 704 mg/dL (ref 70–99)
Potassium: 4.6 mEq/L (ref 3.5–5.1)
Sodium: 127 mEq/L — ABNORMAL LOW (ref 135–145)
Total Bilirubin: 0.6 mg/dL (ref 0.3–1.2)
Total Protein: 7.8 g/dL (ref 6.0–8.3)

## 2011-10-21 LAB — CBC
HCT: 39.7 % (ref 39.0–52.0)
Hemoglobin: 14.5 g/dL (ref 13.0–17.0)
MCH: 30.9 pg (ref 26.0–34.0)
MCHC: 36.5 g/dL — ABNORMAL HIGH (ref 30.0–36.0)
MCV: 84.5 fL (ref 78.0–100.0)
Platelets: 131 10*3/uL — ABNORMAL LOW (ref 150–400)
RBC: 4.7 MIL/uL (ref 4.22–5.81)
RDW: 11.8 % (ref 11.5–15.5)
WBC: 8.4 10*3/uL (ref 4.0–10.5)

## 2011-10-21 LAB — MRSA PCR SCREENING: MRSA by PCR: POSITIVE — AB

## 2011-10-21 LAB — TSH: TSH: 4.891 u[IU]/mL — ABNORMAL HIGH (ref 0.350–4.500)

## 2011-10-21 LAB — MAGNESIUM: Magnesium: 1.9 mg/dL (ref 1.5–2.5)

## 2011-10-21 LAB — PHOSPHORUS: Phosphorus: 4.1 mg/dL (ref 2.3–4.6)

## 2011-10-21 MED ORDER — POTASSIUM CHLORIDE IN NACL 20-0.9 MEQ/L-% IV SOLN
INTRAVENOUS | Status: DC
  Administered 2011-10-21: 05:00:00 via INTRAVENOUS

## 2011-10-21 MED ORDER — METFORMIN HCL 500 MG PO TABS
500.0000 mg | ORAL_TABLET | Freq: Two times a day (BID) | ORAL | Status: DC
  Administered 2011-10-21 – 2011-10-22 (×3): 500 mg via ORAL
  Filled 2011-10-21 (×3): qty 1

## 2011-10-21 MED ORDER — INSULIN ASPART 100 UNIT/ML ~~LOC~~ SOLN
0.0000 [IU] | Freq: Three times a day (TID) | SUBCUTANEOUS | Status: DC
  Administered 2011-10-21: 2 [IU] via SUBCUTANEOUS
  Administered 2011-10-21: 8 [IU] via SUBCUTANEOUS
  Administered 2011-10-21: 11 [IU] via SUBCUTANEOUS
  Administered 2011-10-22: 8 [IU] via SUBCUTANEOUS
  Filled 2011-10-21: qty 3

## 2011-10-21 MED ORDER — CHLORHEXIDINE GLUCONATE CLOTH 2 % EX PADS
6.0000 | MEDICATED_PAD | Freq: Every day | CUTANEOUS | Status: DC
  Administered 2011-10-21 – 2011-10-22 (×2): 6 via TOPICAL

## 2011-10-21 MED ORDER — MUPIROCIN 2 % EX OINT
1.0000 "application " | TOPICAL_OINTMENT | Freq: Two times a day (BID) | CUTANEOUS | Status: DC
  Administered 2011-10-21 – 2011-10-22 (×3): 1 via NASAL
  Filled 2011-10-21: qty 22

## 2011-10-21 MED ORDER — INSULIN ASPART 100 UNIT/ML ~~LOC~~ SOLN
0.0000 [IU] | Freq: Every day | SUBCUTANEOUS | Status: DC
  Administered 2011-10-21: 2 [IU] via SUBCUTANEOUS

## 2011-10-21 NOTE — Progress Notes (Signed)
Subjective: This man was diagnosed with uncontrolled newly diagnosed diabetes. In fact he tells me that approximately 2 years ago he was diagnosed with diabetes, started on metformin and then he was told that he no longer had diabetes. He feels better since he has been on IV fluids and his sugars are in better control now. He is off the glucose stabilizer. He is on sliding scale of insulin. He is on carb modified diet.           Physical Exam: Blood pressure 104/55, pulse 69, temperature 97.7 F (36.5 C), temperature source Oral, resp. rate 17, height 5\' 9"  (1.753 m), weight 83.9 kg (184 lb 15.5 oz), SpO2 94.00%. Looks systemically well. Heart sounds are present and normal. Lung fields are clear there is alert and orientated without any focal neurological signs.   Investigations:  Recent Results (from the past 240 hour(s))  MRSA PCR SCREENING     Status: Abnormal   Collection Time   10/21/11 12:30 AM      Component Value Range Status Comment   MRSA by PCR POSITIVE (*) NEGATIVE  Final      Basic Metabolic Panel:  Basename 10/21/11 0448 10/21/11 0447 10/20/11 1653  NA -- 138 127*  K -- 3.5 4.6  CL -- 103 89*  CO2 -- 24 24  GLUCOSE -- 113* 704*  BUN -- 16 22  CREATININE -- 1.01 1.05  CALCIUM -- 8.5 9.9  MG -- 1.9 --  PHOS 4.1 -- --   Liver Function Tests:  [facility] 10/20/11 1653  AST 12  ALT 14  ALKPHOS 164*  BILITOT 0.6  PROT 7.8  ALBUMIN 4.1     CBC:  Basename 10/21/11 0447 10/20/11 1653  WBC 8.4 10.6*  NEUTROABS -- 7.7  HGB 14.5 16.9  HCT 39.7 46.2  MCV 84.5 83.8  PLT 131* 148*    Dg Chest 2 View  10/20/2011  *RADIOLOGY REPORT*  Clinical Data: Hyperglycemia  CHEST - 2 VIEW  Comparison: Chest radiograph 02/04/2008  Findings: Left-sided pacemaker with two continuous leads overlies stable cardiac silhouette.  No effusion, infiltrate, or pneumothorax .  IMPRESSION: No acute cardiopulmonary process.  Original Report Authenticated By: Genevive Bi, M.D.        Medications: I have reviewed the patient's current medications.  Impression: 1. Newly diagnosed uncontrolled type 2 diabetes mellitus. 2. Sinus node disease status post pacemaker. 3. Depression and anxiety.     Plan: 1. Start metformin. 2. Move to regular floor. 3. Hopefully discharge home tomorrow. I discussed with him strategies for nutrition and exercise.     LOS: 1 day   Wilson Singer Pager [phone]  10/21/2011, 8:48 AM

## 2011-10-21 NOTE — Progress Notes (Signed)
Talked to pt and his wife about diabetes.  Pt was provided with new onset diabetic information by previous nurse.  Instructed pt to watch videos on diabetes on pt education channel.  Pt reports that several of his family has diabetes and he is familiar with it and how to care for self.  Instructed pt and wife to call staff with any questions that may arise.  Also talked with pt and his wife about carbohydrate counting and normal blood glucose levels.  Both verbalize understanding and state that they will call staff with questions if needed.

## 2011-10-21 NOTE — Progress Notes (Signed)
Report given to Jean Rosenthal, LPN on Dept 213.  Pt will be transferred to room 311 via wheelchair.  Family with pt in room at bedside.  Pt is A&O, VSS.

## 2011-10-22 LAB — COMPREHENSIVE METABOLIC PANEL
ALT: 11 U/L (ref 0–53)
AST: 12 U/L (ref 0–37)
Albumin: 3.1 g/dL — ABNORMAL LOW (ref 3.5–5.2)
Alkaline Phosphatase: 76 U/L (ref 39–117)
BUN: 13 mg/dL (ref 6–23)
CO2: 23 mEq/L (ref 19–32)
Calcium: 8.6 mg/dL (ref 8.4–10.5)
Chloride: 100 mEq/L (ref 96–112)
Creatinine, Ser: 0.91 mg/dL (ref 0.50–1.35)
GFR calc Af Amer: >90 mL/min (ref >90)
GFR calc non Af Amer: >90 mL/min (ref >90)
Glucose, Bld: 258 mg/dL — ABNORMAL HIGH (ref 70–99)
Potassium: 3.7 mEq/L (ref 3.5–5.1)
Sodium: 132 mEq/L — ABNORMAL LOW (ref 135–145)
Total Bilirubin: 0.5 mg/dL (ref 0.3–1.2)
Total Protein: 6.1 g/dL (ref 6.0–8.3)

## 2011-10-22 LAB — LIPID PANEL
Cholesterol: 138 mg/dL (ref 0–200)
HDL: 26 mg/dL — ABNORMAL LOW (ref >39)
LDL Cholesterol: 40 mg/dL (ref 0–99)
Total CHOL/HDL Ratio: 5.3 RATIO
Triglycerides: 359 mg/dL — ABNORMAL HIGH (ref <150)
VLDL: 72 mg/dL — ABNORMAL HIGH (ref 0–40)

## 2011-10-22 LAB — CBC
HCT: 41.7 % (ref 39.0–52.0)
Hemoglobin: 15.2 g/dL (ref 13.0–17.0)
MCH: 31.1 pg (ref 26.0–34.0)
MCHC: 36.5 g/dL — ABNORMAL HIGH (ref 30.0–36.0)
MCV: 85.3 fL (ref 78.0–100.0)
Platelets: 116 10*3/uL — ABNORMAL LOW (ref 150–400)
RBC: 4.89 MIL/uL (ref 4.22–5.81)
RDW: 11.9 % (ref 11.5–15.5)
WBC: 7 10*3/uL (ref 4.0–10.5)

## 2011-10-22 LAB — GLUCOSE, CAPILLARY
Glucose-Capillary: 260 mg/dL — ABNORMAL HIGH (ref 70–99)
Glucose-Capillary: 289 mg/dL — ABNORMAL HIGH (ref 70–99)

## 2011-10-22 LAB — HEMOGLOBIN A1C
Hgb A1c MFr Bld: 9.7 % — ABNORMAL HIGH (ref <5.7)
Mean Plasma Glucose: 232 mg/dL — ABNORMAL HIGH (ref <117)

## 2011-10-22 LAB — TSH: TSH: 3.125 u[IU]/mL (ref 0.350–4.500)

## 2011-10-22 MED ORDER — FREESTYLE SYSTEM KIT: 1.0000 | PACK | Status: DC | PRN

## 2011-10-22 MED ORDER — GLUCOSE BLOOD VI STRP: ORAL_STRIP | Status: AC

## 2011-10-22 MED ORDER — METFORMIN HCL 1000 MG PO TABS: 1000.0000 mg | ORAL_TABLET | Freq: Two times a day (BID) | ORAL | Status: DC

## 2011-10-22 NOTE — Progress Notes (Signed)
10/22/11 1013 patient being discharged home with wife. Reviewed discharge instructions with patient and wife at bedside, given copy of instructions, med list, prescriptions, carenotes, and f/u appointment information. Diabetes and MRSA carenotes given and reviewed via teach back method. Patient able to state 3 signs of hyperglycemia, 3 signs of hypoglycemia, when and how to check blood sugars, when to call MD, when to take metformin. Verbalized understanding of diabetes education. IV site d/c'd and within normal limits per lisa walker, RN. Denies pain or discomfort at this time. Pt in stable condition awaiting discharge.

## 2011-10-22 NOTE — Progress Notes (Signed)
10/22/11 1024 patient left floor in stable condition via ambulation at patient request accompanied by nurse. Discharged home.

## 2011-10-22 NOTE — Progress Notes (Signed)
[facility] [address] [place] [address]  October 22, 2011  Patient: Theodore [name]  Date of Birth: Mar 17, 1966  Date of Visit: 10/20/2011    To Whom It May Concern:  Theodore [name] was seen and treated in our hospital on 10/20/2011 and was discharged on 10/22/2011.Marland Kitchen Theodore [name]  can return to work on 10/24/2011.  Sincerely,

## 2011-10-22 NOTE — Discharge Summary (Signed)
Physician Discharge Summary  Patient ID: Theodore [name] MRN: [id number] DOB/AGE: 04/02/1966 46 y.o.  Admit date: 10/20/2011 Discharge date: 10/22/2011    Discharge Diagnoses:  1. Uncontrolled type 2 diabetes mellitus, newly diagnosed. 2. Sinoatrial bradycardia, status post pacemaker. 3. Depression and anxiety. 4. Hypertriglyceridemia.   Medication List  As of 10/22/2011  8:59 AM   TAKE these medications         ALPRAZolam 0.5 MG tablet   Commonly known as: XANAX   Take 0.5 mg by mouth every 6 (six) hours as needed. anxiety      escitalopram 10 MG tablet   Commonly known as: LEXAPRO   Take 10 mg by mouth daily.      glucose blood test strip   Use as instructed      glucose monitoring kit monitoring kit   1 each by Does not apply route as needed for other.      metFORMIN 1000 MG tablet   Commonly known as: GLUCOPHAGE   Take 1 tablet (1,000 mg total) by mouth 2 (two) times daily with a meal.      propranolol 120 MG 24 hr capsule   Commonly known as: INDERAL LA   Take 120 mg by mouth daily.            Discharged Condition: Improved and stable.    Consults: None.  Significant Diagnostic Studies: Dg Chest 2 View  10/20/2011  *RADIOLOGY REPORT*  Clinical Data: Hyperglycemia  CHEST - 2 VIEW  Comparison: Chest radiograph 02/04/2008  Findings: Left-sided pacemaker with two continuous leads overlies stable cardiac silhouette.  No effusion, infiltrate, or pneumothorax .  IMPRESSION: No acute cardiopulmonary process.  Original Report Authenticated By: Genevive Bi, M.D.    Lab Results: Basic Metabolic Panel:  Basename 10/22/11 0506 10/21/11 0448 10/21/11 0447  NA 132* -- 138  K 3.7 -- 3.5  CL 100 -- 103  CO2 23 -- 24  GLUCOSE 258* -- 113*  BUN 13 -- 16  CREATININE 0.91 -- 1.01  CALCIUM 8.6 -- 8.5  MG -- -- 1.9  PHOS -- 4.1 --   hemoglobin A1c 9.7%. Liver Function Tests:  [facility] 10/22/11 0506 10/20/11 1653  AST 12 12  ALT 11 14  ALKPHOS 76 164*    BILITOT 0.5 0.6  PROT 6.1 7.8  ALBUMIN 3.1* 4.1     CBC:  Basename 10/22/11 0506 10/21/11 0447 10/20/11 1653  WBC 7.0 8.4 --  NEUTROABS -- -- 7.7  HGB 15.2 14.5 --  HCT 41.7 39.7 --  MCV 85.3 84.5 --  PLT 116* 131* --    Recent Results (from the past 240 hour(s))  MRSA PCR SCREENING     Status: Abnormal   Collection Time   10/21/11 12:30 AM      Component Value Range Status Comment   MRSA by PCR POSITIVE (*) NEGATIVE  Final      Hospital Course: This 46 year old man was admitted with symptoms of polydipsia, polyuria and weight loss. He was found to have a lot glucose on arrival of 704. Fortunately, he was not in diabetic ketoacidosis. He was treated appropriately with intravenous fluids, intravenous insulin. He cleaned improved his sugars to a level that is manageable with oral hypoglycemic agents. I've instructed him on strategies for diet and also exercise. He feels better today.  Discharge Exam: Blood pressure 110/65, pulse 73, temperature 97.6 F (36.4 C), temperature source Oral, resp. rate 18, height 5\' 9"  (1.753 m), weight 83.5 kg (184 lb 1.4  oz), SpO2 95.00%. He looks systemically well. Heart sounds are present and normal. Lung fields are clear. He is alert and oriented without any focal signs.  Disposition: Home. He will need close followup regarding his diabetes. Please note his hemoglobin A1c was 9.7%.  Discharge Orders    Future Orders Please Complete By Expires   Diet - low sodium heart healthy      Increase activity slowly         Follow-up Information    Follow up with Kirk Ruths, MD. Schedule an appointment as soon as possible for a visit in 1 week.   Contact information:   [address] [place] [address] [place] [address] [phone]          SignedWilson Singer Pager [phone]  10/22/2011, 8:59 AM

## 2011-11-22 ENCOUNTER — Other Ambulatory Visit: Payer: Self-pay | Admitting: Internal Medicine

## 2012-04-02 ENCOUNTER — Encounter (HOSPITAL_COMMUNITY): Payer: Self-pay | Admitting: *Deleted

## 2012-04-02 ENCOUNTER — Emergency Department (HOSPITAL_COMMUNITY)
Admission: EM | Admit: 2012-04-02 | Discharge: 2012-04-02 | Disposition: A | Discharge status: Home / Self Care | Payer: BC Managed Care – PPO | Attending: Emergency Medicine | Admitting: Emergency Medicine

## 2012-04-02 DIAGNOSIS — E119 Type 2 diabetes mellitus without complications: Secondary | ICD-10-CM | POA: Insufficient documentation

## 2012-04-02 DIAGNOSIS — E860 Dehydration: Secondary | ICD-10-CM

## 2012-04-02 DIAGNOSIS — F341 Dysthymic disorder: Secondary | ICD-10-CM | POA: Insufficient documentation

## 2012-04-02 LAB — CBC WITH DIFFERENTIAL/PLATELET
Basophils Absolute: 0 10*3/uL (ref 0.0–0.1)
Basophils Relative: 0 % (ref 0–1)
Eosinophils Absolute: 0.1 10*3/uL (ref 0.0–0.7)
Eosinophils Relative: 1 % (ref 0–5)
HCT: 48 % (ref 39.0–52.0)
Hemoglobin: 17.2 g/dL — ABNORMAL HIGH (ref 13.0–17.0)
Lymphocytes Relative: 13 % (ref 12–46)
Lymphs Abs: 2.2 10*3/uL (ref 0.7–4.0)
MCH: 31.6 pg (ref 26.0–34.0)
MCHC: 35.8 g/dL (ref 30.0–36.0)
MCV: 88.2 fL (ref 78.0–100.0)
Monocytes Absolute: 1.5 10*3/uL — ABNORMAL HIGH (ref 0.1–1.0)
Monocytes Relative: 9 % (ref 3–12)
Neutro Abs: 13.6 10*3/uL — ABNORMAL HIGH (ref 1.7–7.7)
Neutrophils Relative %: 78 % — ABNORMAL HIGH (ref 43–77)
Platelets: 182 10*3/uL (ref 150–400)
RBC: 5.44 MIL/uL (ref 4.22–5.81)
RDW: 12.8 % (ref 11.5–15.5)
WBC: 17.5 10*3/uL — ABNORMAL HIGH (ref 4.0–10.5)

## 2012-04-02 LAB — URINALYSIS, ROUTINE W REFLEX MICROSCOPIC
Glucose, UA: NEGATIVE mg/dL
Hgb urine dipstick: NEGATIVE
Leukocytes, UA: NEGATIVE
Nitrite: NEGATIVE
Protein, ur: 30 mg/dL — AB
Specific Gravity, Urine: >1.03 — ABNORMAL HIGH (ref 1.005–1.030)
Urobilinogen, UA: 1 mg/dL (ref 0.0–1.0)
pH: 5.5 (ref 5.0–8.0)

## 2012-04-02 LAB — URINE MICROSCOPIC-ADD ON

## 2012-04-02 LAB — COMPREHENSIVE METABOLIC PANEL
ALT: 23 U/L (ref 0–53)
AST: 26 U/L (ref 0–37)
Albumin: 4.9 g/dL (ref 3.5–5.2)
Alkaline Phosphatase: 74 U/L (ref 39–117)
BUN: 30 mg/dL — ABNORMAL HIGH (ref 6–23)
CO2: 26 mEq/L (ref 19–32)
Calcium: 10.5 mg/dL (ref 8.4–10.5)
Chloride: 102 mEq/L (ref 96–112)
Creatinine, Ser: 1.7 mg/dL — ABNORMAL HIGH (ref 0.50–1.35)
GFR calc Af Amer: 54 mL/min — ABNORMAL LOW (ref >90)
GFR calc non Af Amer: 47 mL/min — ABNORMAL LOW (ref >90)
Glucose, Bld: 98 mg/dL (ref 70–99)
Potassium: 4.7 mEq/L (ref 3.5–5.1)
Sodium: 140 mEq/L (ref 135–145)
Total Bilirubin: 0.7 mg/dL (ref 0.3–1.2)
Total Protein: 7.8 g/dL (ref 6.0–8.3)

## 2012-04-02 LAB — GLUCOSE, CAPILLARY: Glucose-Capillary: 98 mg/dL (ref 70–99)

## 2012-04-02 MED ORDER — SODIUM CHLORIDE 0.9 % IV BOLUS (SEPSIS)
1000.0000 mL | Freq: Once | INTRAVENOUS | Status: AC
  Administered 2012-04-02: 1000 mL via INTRAVENOUS

## 2012-04-02 NOTE — ED Notes (Signed)
Patient states he is feeling somewhat better, decreased amount of cramping in his legs.  Discussed keeping hydrated while working in hot conditions with patient.

## 2012-04-02 NOTE — ED Provider Notes (Cosign Needed)
History     CSN: [id number]  Arrival date & time 04/02/12  1757   First MD Initiated Contact with Patient 04/02/12 1810      Chief Complaint  Patient presents with  . Extremity Pain    (Consider location/radiation/quality/duration/timing/severity/associated sxs/prior treatment) Patient is a 46 y.o. male presenting with extremity pain. The history is provided by the patient (the pt comlains of leg pain after working all day outside). No language interpreter was used.  Extremity Pain This is a new problem. The current episode started 3 to 5 hours ago. The problem occurs constantly. The problem has not changed since onset.Pertinent negatives include no chest pain, no abdominal pain and no headaches. Nothing aggravates the symptoms. Nothing relieves the symptoms.    Past Medical History  Diagnosis Date  . Pacemaker   . Anxiety   . Depression   . Bradycardia   . Diabetes mellitus     Past Surgical History  Procedure Date  . Pacemaker insertion   . Shoulder surgery   . Hand surgery     Family History  Problem Relation Age of Onset  . Diabetes Sister   . Diabetes Mother   . Coronary artery disease Mother     History  Substance Use Topics  . Smoking status: Never Smoker   . Smokeless tobacco: Not on file  . Alcohol Use: No      Review of Systems  Constitutional: Negative for fatigue.  HENT: Negative for congestion, sinus pressure and ear discharge.   Eyes: Negative for discharge.  Respiratory: Negative for cough.   Cardiovascular: Negative for chest pain.  Gastrointestinal: Negative for abdominal pain and diarrhea.  Genitourinary: Negative for frequency and hematuria.  Musculoskeletal: Negative for back pain.       Bilateral leg pain  Skin: Negative for rash.  Neurological: Negative for seizures and headaches.  Hematological: Negative.   Psychiatric/Behavioral: Negative for hallucinations.    Allergies  Cephalexin  Home Medications   Current Outpatient  Rx  Name Route Sig Dispense Refill  . ALPRAZOLAM 0.5 MG PO TABS Oral Take 0.5 mg by mouth every 6 (six) hours as needed. anxiety    . ESCITALOPRAM OXALATE 10 MG PO TABS Oral Take 10 mg by mouth daily.    Marland Kitchen GLUCOSE BLOOD VI STRP  Use as instructed 100 each 0  . METFORMIN HCL 1000 MG PO TABS Oral Take 1,000 mg by mouth every evening.    Marland Kitchen NAPROXEN 500 MG PO TABS Oral Take 500 mg by mouth 2 (two) times daily as needed.    Marland Kitchen PROPRANOLOL HCL ER 120 MG PO CP24 Oral Take 120 mg by mouth every morning.       BP 155/87  Pulse 74  Temp 97.5 F (36.4 C) (Oral)  Resp 20  Ht 5\' 9"  (1.753 m)  Wt 186 lb (84.369 kg)  BMI 27.47 kg/m2  SpO2 96%  Physical Exam  Constitutional: He is oriented to person, place, and time. He appears well-developed.  HENT:  Head: Normocephalic and atraumatic.  Eyes: Conjunctivae and EOM are normal. No scleral icterus.  Neck: Neck supple. No thyromegaly present.  Cardiovascular: Normal rate and regular rhythm.  Exam reveals no gallop and no friction rub.   No murmur heard. Pulmonary/Chest: No stridor. He has no wheezes. He has no rales. He exhibits no tenderness.  Abdominal: He exhibits no distension. There is no tenderness. There is no rebound.  Musculoskeletal: Normal range of motion. He exhibits no edema.  Lymphadenopathy:  He has no cervical adenopathy.  Neurological: He is oriented to person, place, and time. Coordination normal.  Skin: No rash noted. No erythema.  Psychiatric: He has a normal mood and affect. His behavior is normal.    ED Course  Procedures (including critical care time)  Labs Reviewed  CBC WITH DIFFERENTIAL - Abnormal; Notable for the following:    WBC 17.5 (*)     Hemoglobin 17.2 (*)     Neutrophils Relative 78 (*)     Neutro Abs 13.6 (*)     Monocytes Absolute 1.5 (*)     All other components within normal limits  COMPREHENSIVE METABOLIC PANEL - Abnormal; Notable for the following:    BUN 30 (*)     Creatinine, Ser 1.70 (*)      GFR calc non Af Amer 47 (*)     GFR calc Af Amer 54 (*)     All other components within normal limits  URINALYSIS, ROUTINE W REFLEX MICROSCOPIC - Abnormal; Notable for the following:    Specific Gravity, Urine >1.030 (*)     Bilirubin Urine SMALL (*)     Ketones, ur TRACE (*)     Protein, ur 30 (*)     All other components within normal limits  URINE MICROSCOPIC-ADD ON - Abnormal; Notable for the following:    Bacteria, UA FEW (*)     Casts HYALINE CASTS (*)     Crystals CA OXALATE CRYSTALS (*)     All other components within normal limits  GLUCOSE, CAPILLARY   No results found.   1. Dehydration    Pt improved with tx  MDM          Benny Lennert, MD 04/02/12 2038

## 2012-04-02 NOTE — ED Notes (Signed)
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 Denies any n/v,  Thinks he is dehydrated.

## 2012-04-02 NOTE — ED Notes (Signed)
Patient unable to provide urine sample at present.

## 2012-04-02 NOTE — ED Notes (Signed)
Cramping of legs , arms , thinks he is dehydrated.  Has been working outside all day.  No vomiting or diarrhea.

## 2012-12-14 ENCOUNTER — Encounter (HOSPITAL_COMMUNITY): Payer: Self-pay

## 2012-12-14 ENCOUNTER — Emergency Department (HOSPITAL_COMMUNITY)
Admission: EM | Admit: 2012-12-14 | Discharge: 2012-12-14 | Disposition: A | Discharge status: Home / Self Care | Payer: BC Managed Care – PPO | Attending: Emergency Medicine | Admitting: Emergency Medicine

## 2012-12-14 ENCOUNTER — Emergency Department (HOSPITAL_COMMUNITY): Payer: BC Managed Care – PPO

## 2012-12-14 DIAGNOSIS — R112 Nausea with vomiting, unspecified: Secondary | ICD-10-CM | POA: Insufficient documentation

## 2012-12-14 DIAGNOSIS — Z8679 Personal history of other diseases of the circulatory system: Secondary | ICD-10-CM | POA: Insufficient documentation

## 2012-12-14 DIAGNOSIS — R739 Hyperglycemia, unspecified: Secondary | ICD-10-CM

## 2012-12-14 DIAGNOSIS — F329 Major depressive disorder, single episode, unspecified: Secondary | ICD-10-CM | POA: Insufficient documentation

## 2012-12-14 DIAGNOSIS — F411 Generalized anxiety disorder: Secondary | ICD-10-CM | POA: Insufficient documentation

## 2012-12-14 DIAGNOSIS — E1169 Type 2 diabetes mellitus with other specified complication: Secondary | ICD-10-CM | POA: Insufficient documentation

## 2012-12-14 DIAGNOSIS — Z950 Presence of cardiac pacemaker: Secondary | ICD-10-CM | POA: Insufficient documentation

## 2012-12-14 DIAGNOSIS — Z79899 Other long term (current) drug therapy: Secondary | ICD-10-CM | POA: Insufficient documentation

## 2012-12-14 DIAGNOSIS — N23 Unspecified renal colic: Secondary | ICD-10-CM

## 2012-12-14 DIAGNOSIS — F3289 Other specified depressive episodes: Secondary | ICD-10-CM | POA: Insufficient documentation

## 2012-12-14 LAB — CBC WITH DIFFERENTIAL/PLATELET
Basophils Absolute: 0 10*3/uL (ref 0.0–0.1)
Basophils Relative: 1 % (ref 0–1)
Eosinophils Absolute: 0.2 10*3/uL (ref 0.0–0.7)
Eosinophils Relative: 2 % (ref 0–5)
HCT: 44.2 % (ref 39.0–52.0)
Hemoglobin: 15.4 g/dL (ref 13.0–17.0)
Lymphocytes Relative: 18 % (ref 12–46)
Lymphs Abs: 1.6 10*3/uL (ref 0.7–4.0)
MCH: 30.7 pg (ref 26.0–34.0)
MCHC: 34.8 g/dL (ref 30.0–36.0)
MCV: 88 fL (ref 78.0–100.0)
Monocytes Absolute: 0.8 10*3/uL (ref 0.1–1.0)
Monocytes Relative: 9 % (ref 3–12)
Neutro Abs: 5.9 10*3/uL (ref 1.7–7.7)
Neutrophils Relative %: 70 % (ref 43–77)
Platelets: 154 10*3/uL (ref 150–400)
RBC: 5.02 MIL/uL (ref 4.22–5.81)
RDW: 12.9 % (ref 11.5–15.5)
WBC: 8.5 10*3/uL (ref 4.0–10.5)

## 2012-12-14 LAB — COMPREHENSIVE METABOLIC PANEL
ALT: 18 U/L (ref 0–53)
AST: 18 U/L (ref 0–37)
Albumin: 3.8 g/dL (ref 3.5–5.2)
Alkaline Phosphatase: 69 U/L (ref 39–117)
BUN: 17 mg/dL (ref 6–23)
CO2: 25 mEq/L (ref 19–32)
Calcium: 9.1 mg/dL (ref 8.4–10.5)
Chloride: 102 mEq/L (ref 96–112)
Creatinine, Ser: 1.3 mg/dL (ref 0.50–1.35)
GFR calc Af Amer: 74 mL/min — ABNORMAL LOW (ref >90)
GFR calc non Af Amer: 64 mL/min — ABNORMAL LOW (ref >90)
Glucose, Bld: 145 mg/dL — ABNORMAL HIGH (ref 70–99)
Potassium: 4.3 mEq/L (ref 3.5–5.1)
Sodium: 138 mEq/L (ref 135–145)
Total Bilirubin: 0.5 mg/dL (ref 0.3–1.2)
Total Protein: 6.7 g/dL (ref 6.0–8.3)

## 2012-12-14 LAB — URINALYSIS, ROUTINE W REFLEX MICROSCOPIC
Bilirubin Urine: NEGATIVE
Glucose, UA: NEGATIVE mg/dL
Hgb urine dipstick: NEGATIVE
Ketones, ur: NEGATIVE mg/dL
Leukocytes, UA: NEGATIVE
Nitrite: NEGATIVE
Protein, ur: NEGATIVE mg/dL
Specific Gravity, Urine: >1.03 — ABNORMAL HIGH (ref 1.005–1.030)
Urobilinogen, UA: 0.2 mg/dL (ref 0.0–1.0)
pH: 5.5 (ref 5.0–8.0)

## 2012-12-14 LAB — LIPASE, BLOOD: Lipase: 30 U/L (ref 11–59)

## 2012-12-14 MED ORDER — ONDANSETRON 8 MG PO TBDP: 8.0000 mg | ORAL_TABLET | Freq: Three times a day (TID) | ORAL | Status: DC | PRN

## 2012-12-14 MED ORDER — OXYCODONE-ACETAMINOPHEN 5-325 MG PO TABS: 1.0000 | ORAL_TABLET | ORAL | Status: DC | PRN

## 2012-12-14 MED ORDER — SODIUM CHLORIDE 0.9 % IV SOLN
INTRAVENOUS | Status: DC
  Administered 2012-12-14: 10:00:00 via INTRAVENOUS

## 2012-12-14 MED ORDER — KETOROLAC TROMETHAMINE 30 MG/ML IJ SOLN
30.0000 mg | Freq: Once | INTRAMUSCULAR | Status: AC
  Administered 2012-12-14: 30 mg via INTRAVENOUS
  Filled 2012-12-14: qty 1

## 2012-12-14 MED ORDER — HYDROMORPHONE HCL PF 1 MG/ML IJ SOLN
1.0000 mg | Freq: Once | INTRAMUSCULAR | Status: AC
  Administered 2012-12-14: 1 mg via INTRAVENOUS
  Filled 2012-12-14: qty 1

## 2012-12-14 MED ORDER — ONDANSETRON HCL 4 MG/2ML IJ SOLN
4.0000 mg | Freq: Once | INTRAMUSCULAR | Status: AC
  Administered 2012-12-14: 4 mg via INTRAVENOUS
  Filled 2012-12-14: qty 2

## 2012-12-14 NOTE — ED Provider Notes (Addendum)
History  This chart was scribed for Hilario Quarry, MD, by Candelaria Stagers, ED Scribe. This patient was seen in room APA08/APA08 and the patient's care was started at 9:08 AM   CSN: [id number]  Arrival date & time 12/14/12  4098   First MD Initiated Contact with Patient 12/14/12 [phone]      Chief Complaint  Patient presents with  . Flank Pain    The history is provided by the patient. No language interpreter was used.   Theodore [name] is a 47 y.o. male who presents to the Emergency Department complaining of sudden onset of right flank pain that woke him from sleep around 6 am this morning.  He is also experiencing associated nausea and vomiting.  Pt reports at worst the pain is 10/10 and now is 5/10.  Pt has h/o diabetes and did not check his blood sugar levels today.  He denies fever, diarrhea, hematuria, increased urination, or dysuria.  He has no h/o UTI, kidney stones, abdominal surgeries, or prostate problems.  Past Medical History  Diagnosis Date  . Pacemaker   . Anxiety   . Depression   . Bradycardia   . Diabetes mellitus     Past Surgical History  Procedure Laterality Date  . Pacemaker insertion    . Shoulder surgery    . Hand surgery      Family History  Problem Relation Age of Onset  . Diabetes Sister   . Diabetes Mother   . Coronary artery disease Mother     History  Substance Use Topics  . Smoking status: Never Smoker   . Smokeless tobacco: Not on file  . Alcohol Use: No      Review of Systems  Constitutional: Negative for fever and chills.  Respiratory: Negative for cough and shortness of breath.   Cardiovascular: Negative for chest pain and leg swelling.  Gastrointestinal: Positive for vomiting. Negative for diarrhea.  Genitourinary: Negative for dysuria and hematuria.    Allergies  Cephalexin  Home Medications   Current Outpatient Rx  Name  Route  Sig  Dispense  Refill  . ALPRAZolam (XANAX) 0.5 MG tablet   Oral   Take 0.5 mg by mouth  every 6 (six) hours as needed. anxiety         . escitalopram (LEXAPRO) 10 MG tablet   Oral   Take 10 mg by mouth daily.         Marland Kitchen EXPIRED: metFORMIN (GLUCOPHAGE) 1000 MG tablet   Oral   Take 1,000 mg by mouth every evening.         . naproxen (NAPROSYN) 500 MG tablet   Oral   Take 500 mg by mouth 2 (two) times daily as needed.         . propranolol (INDERAL LA) 120 MG 24 hr capsule   Oral   Take 120 mg by mouth every morning.            BP 126/75  Pulse 77  Temp(Src) 97.6 F (36.4 C) (Oral)  Resp 20  Ht 5\' 9"  (1.753 m)  Wt 190 lb (86.183 kg)  BMI 28.05 kg/m2  SpO2 95%  Physical Exam  Nursing note and vitals reviewed. Constitutional: He is oriented to person, place, and time. He appears well-developed and well-nourished. No distress.  HENT:  Head: Normocephalic and atraumatic.  Eyes: Conjunctivae are normal. Right eye exhibits no discharge. Left eye exhibits no discharge.  Neck: Normal range of motion.  Cardiovascular: Intact distal pulses.  Pulmonary/Chest: Effort normal. No respiratory distress.  Abdominal: Soft. Bowel sounds are normal. There is no tenderness.  Musculoskeletal: Normal range of motion.  Neurological: He is alert and oriented to person, place, and time.  Skin: Skin is warm and dry. He is not diaphoretic.  Psychiatric: He has a normal mood and affect. His behavior is normal.    ED Course  Procedures   DIAGNOSTIC STUDIES: Oxygen Saturation is 95% on room air, normal by my interpretation.    COORDINATION OF CARE:  9:15 AM Discussed course of care with pt which includes lab work and pain medication.  Pt understands and agrees.    Labs Reviewed  URINALYSIS, ROUTINE W REFLEX MICROSCOPIC - Abnormal; Notable for the following:    Specific Gravity, Urine >1.030 (*)    All other components within normal limits  COMPREHENSIVE METABOLIC PANEL - Abnormal; Notable for the following:    Glucose, Bld 145 (*)    GFR calc non Af Amer 64 (*)     GFR calc Af Amer 74 (*)    All other components within normal limits  CBC WITH DIFFERENTIAL  LIPASE, BLOOD   Ct Abdomen Pelvis Wo Contrast  12/14/2012  *RADIOLOGY REPORT*  Clinical Data: Right flank pain with  CT ABDOMEN AND PELVIS WITHOUT CONTRAST  Technique:  Multidetector CT imaging of the abdomen and pelvis was performed following the standard protocol without intravenous contrast.  Comparison: None  Findings: Lung bases are unremarkable.  Mild degenerative changes thoracolumbar spine.  Unenhanced liver is unremarkable.  No intrahepatic biliary ductal dilatation.  No calcified gallstones are noted within gallbladder. The pancreas, spleen and adrenals are unremarkable.  The small hiatal hernia.  The unenhanced kidneys shows no nephrolithiasis.  Mild right hydronephrosis and mild right hydroureter.  Mild right periureteral stranding.  In axial image 83 there is 3 mm calcified calculus in distal right ureter about to 1 cm from right UVJ.  Prostate gland and  seminal vesicles are unremarkable.  No aortic aneurysm.  No small bowel obstruction.  No ascites or free air.  No adenopathy.  There is no pericecal inflammation.  Normal appendix is clearly visualized in axial image 72.  No distal colonic obstruction.  The urinary bladder is partially collapsed limiting its assessment.  IMPRESSION:  1.  No nephrolithiasis.  Mild right hydronephrosis and right hydroureter.  Mild right periureteral stranding. 2.  There is 3 mm calcified calculus in distal right ureter about 1 cm from right UVJ. 3.  Normal appendix.  No pericecal inflammation.   Original Report Authenticated By: Natasha Mead, M.D.      No diagnosis found.    MDM   Results for orders placed during the hospital encounter of 12/14/12  URINALYSIS, ROUTINE W REFLEX MICROSCOPIC      Result Value Range   Color, Urine YELLOW  YELLOW   APPearance CLEAR  CLEAR   Specific Gravity, Urine >1.030 (*) 1.005 - 1.030   pH 5.5  5.0 - 8.0   Glucose, UA  NEGATIVE  NEGATIVE mg/dL   Hgb urine dipstick NEGATIVE  NEGATIVE   Bilirubin Urine NEGATIVE  NEGATIVE   Ketones, ur NEGATIVE  NEGATIVE mg/dL   Protein, ur NEGATIVE  NEGATIVE mg/dL   Urobilinogen, UA 0.2  0.0 - 1.0 mg/dL   Nitrite NEGATIVE  NEGATIVE   Leukocytes, UA NEGATIVE  NEGATIVE  CBC WITH DIFFERENTIAL      Result Value Range   WBC 8.5  4.0 - 10.5 K/uL   RBC 5.02  4.22 -  5.81 MIL/uL   Hemoglobin 15.4  13.0 - 17.0 g/dL   HCT 16.1  09.6 - 04.5 %   MCV 88.0  78.0 - 100.0 fL   MCH 30.7  26.0 - 34.0 pg   MCHC 34.8  30.0 - 36.0 g/dL   RDW 40.9  81.1 - 91.4 %   Platelets 154  150 - 400 K/uL   Neutrophils Relative 70  43 - 77 %   Neutro Abs 5.9  1.7 - 7.7 K/uL   Lymphocytes Relative 18  12 - 46 %   Lymphs Abs 1.6  0.7 - 4.0 K/uL   Monocytes Relative 9  3 - 12 %   Monocytes Absolute 0.8  0.1 - 1.0 K/uL   Eosinophils Relative 2  0 - 5 %   Eosinophils Absolute 0.2  0.0 - 0.7 K/uL   Basophils Relative 1  0 - 1 %   Basophils Absolute 0.0  0.0 - 0.1 K/uL  COMPREHENSIVE METABOLIC PANEL      Result Value Range   Sodium 138  135 - 145 mEq/L   Potassium 4.3  3.5 - 5.1 mEq/L   Chloride 102  96 - 112 mEq/L   CO2 25  19 - 32 mEq/L   Glucose, Bld 145 (*) 70 - 99 mg/dL   BUN 17  6 - 23 mg/dL   Creatinine, Ser 7.82  0.50 - 1.35 mg/dL   Calcium 9.1  8.4 - 95.6 mg/dL   Total Protein 6.7  6.0 - 8.3 g/dL   Albumin 3.8  3.5 - 5.2 g/dL   AST 18  0 - 37 U/L   ALT 18  0 - 53 U/L   Alkaline Phosphatase 69  39 - 117 U/L   Total Bilirubin 0.5  0.3 - 1.2 mg/dL   GFR calc non Af Amer 64 (*) >90 mL/min   GFR calc Af Amer 74 (*) >90 mL/min  LIPASE, BLOOD      Result Value Range   Lipase 30  11 - 59 U/L   No information on file. Patient with 3 mm distal ureteral calculus with pain control.  Here plan percocet and f/u with urology.  Patient educated and discharge instructions given and voices understanding.     1- ureteral  Calculus with ureteral colic 2- diabetes with hyperglycemia  I  personally performed the services described in this documentation, which was scribed in my presence. The recorded information has been reviewed and considered.  Hilario Quarry, MD 12/14/12 2130  Hilario Quarry, MD 12/14/12 1435

## 2012-12-14 NOTE — ED Notes (Addendum)
Pt reports woke up at 6 am with r flank pain and vomiting.  Says couldn't get comfortable.  Denies any urinary symptoms.  Denies history of kidney stones.  Denies injury

## 2012-12-14 NOTE — ED Notes (Signed)
Discharge instructions reviewed with pt, questions answered. Pt verbalized understanding.  

## 2012-12-14 NOTE — ED Notes (Signed)
Patient c/o increased pain in right flank again. Dr Rosalia Hammers made aware, verbal order given.

## 2013-01-07 ENCOUNTER — Encounter: Payer: Self-pay | Admitting: *Deleted

## 2013-01-12 ENCOUNTER — Encounter: Payer: Self-pay | Admitting: *Deleted

## 2013-01-28 ENCOUNTER — Encounter: Payer: Self-pay | Admitting: Cardiovascular Disease

## 2013-01-31 ENCOUNTER — Emergency Department (HOSPITAL_COMMUNITY)
Admission: EM | Admit: 2013-01-31 | Discharge: 2013-01-31 | Disposition: A | Discharge status: Home / Self Care | Payer: BC Managed Care – PPO | Attending: Emergency Medicine | Admitting: Emergency Medicine

## 2013-01-31 ENCOUNTER — Encounter (HOSPITAL_COMMUNITY): Payer: Self-pay | Admitting: *Deleted

## 2013-01-31 DIAGNOSIS — E119 Type 2 diabetes mellitus without complications: Secondary | ICD-10-CM | POA: Insufficient documentation

## 2013-01-31 DIAGNOSIS — Z8679 Personal history of other diseases of the circulatory system: Secondary | ICD-10-CM | POA: Insufficient documentation

## 2013-01-31 DIAGNOSIS — F329 Major depressive disorder, single episode, unspecified: Secondary | ICD-10-CM | POA: Insufficient documentation

## 2013-01-31 DIAGNOSIS — Z9861 Coronary angioplasty status: Secondary | ICD-10-CM | POA: Insufficient documentation

## 2013-01-31 DIAGNOSIS — Z87442 Personal history of urinary calculi: Secondary | ICD-10-CM | POA: Insufficient documentation

## 2013-01-31 DIAGNOSIS — N23 Unspecified renal colic: Secondary | ICD-10-CM | POA: Insufficient documentation

## 2013-01-31 DIAGNOSIS — Z79899 Other long term (current) drug therapy: Secondary | ICD-10-CM | POA: Insufficient documentation

## 2013-01-31 DIAGNOSIS — R3915 Urgency of urination: Secondary | ICD-10-CM | POA: Insufficient documentation

## 2013-01-31 DIAGNOSIS — Z950 Presence of cardiac pacemaker: Secondary | ICD-10-CM | POA: Insufficient documentation

## 2013-01-31 DIAGNOSIS — Z794 Long term (current) use of insulin: Secondary | ICD-10-CM | POA: Insufficient documentation

## 2013-01-31 DIAGNOSIS — F411 Generalized anxiety disorder: Secondary | ICD-10-CM | POA: Insufficient documentation

## 2013-01-31 DIAGNOSIS — Z87891 Personal history of nicotine dependence: Secondary | ICD-10-CM | POA: Insufficient documentation

## 2013-01-31 DIAGNOSIS — R112 Nausea with vomiting, unspecified: Secondary | ICD-10-CM | POA: Insufficient documentation

## 2013-01-31 DIAGNOSIS — I10 Essential (primary) hypertension: Secondary | ICD-10-CM | POA: Insufficient documentation

## 2013-01-31 DIAGNOSIS — F3289 Other specified depressive episodes: Secondary | ICD-10-CM | POA: Insufficient documentation

## 2013-01-31 LAB — URINE MICROSCOPIC-ADD ON

## 2013-01-31 LAB — URINALYSIS, ROUTINE W REFLEX MICROSCOPIC
Bilirubin Urine: NEGATIVE
Glucose, UA: NEGATIVE mg/dL
Leukocytes, UA: NEGATIVE
Nitrite: NEGATIVE
Protein, ur: NEGATIVE mg/dL
Specific Gravity, Urine: >1.03 — ABNORMAL HIGH (ref 1.005–1.030)
Urobilinogen, UA: 1 mg/dL (ref 0.0–1.0)
pH: 6 (ref 5.0–8.0)

## 2013-01-31 MED ORDER — KETOROLAC TROMETHAMINE 60 MG/2ML IM SOLN
60.0000 mg | Freq: Once | INTRAMUSCULAR | Status: AC
  Administered 2013-01-31: 60 mg via INTRAMUSCULAR
  Filled 2013-01-31: qty 2

## 2013-01-31 MED ORDER — OXYCODONE-ACETAMINOPHEN 5-325 MG PO TABS: 1.0000 | ORAL_TABLET | ORAL | Status: DC | PRN

## 2013-01-31 NOTE — ED Notes (Signed)
Pain to right flank, started an hour PTA. Nausea earlier but denies so now.

## 2013-01-31 NOTE — ED Provider Notes (Signed)
History  This chart was scribed for Theodore Crease, MD, by Jiles Prows, ED Scribe. The patient was seen in room APA08/APA08 and the patient's care was started at 3:12 PM.    CSN: [id number]  Arrival date & time 01/31/13  1459   Chief Complaint  Patient presents with  . Flank Pain    The history is provided by medical records and the patient. No language interpreter was used.    HPI Comments: Theodore [name] is a 47 y.o. male with DM, HTN, and a pacemaker who presents to the Emergency Department complaining of sudden moderate non-radiating right flank pain that began about an hour and a half ago. Pt reports the pain feels similar to kidney stone treated a month and a half ago at [facility]. He reports urinary urgency, nausea, and vomiting.  Pt denies hematuria, dysuria, headache, diaphoresis, fever, chills, diarrhea, weakness, cough, SOB and any other pain. Pt used hydrocodone about 45 minutes ago and feels some relief currently. Patient is a former smoker.  Past Medical History  Diagnosis Date  . Pacemaker   . Anxiety   . Depression   . Bradycardia   . Diabetes mellitus   . Syncope 09/2007    2D Echo    Past Surgical History  Procedure Laterality Date  . Pacemaker insertion  10/21/2007    Dr Jenne Campus  . Shoulder surgery  1999  . Hand surgery    . Cardiac catheterization Right 10/20/2007    Family History  Problem Relation Age of Onset  . Diabetes Sister   . Diabetes Mother   . Coronary artery disease Mother   . Hypertension Mother     History  Substance Use Topics  . Smoking status: Former Smoker    Quit date: 10/17/2007  . Smokeless tobacco: Not on file  . Alcohol Use: No      Review of Systems  Constitutional: Negative for fever and diaphoresis.  HENT: Negative for ear pain, congestion and rhinorrhea.   Respiratory: Negative for chest tightness and shortness of breath.   Endocrine: Negative for polyuria.  Genitourinary: Positive for urgency and  flank pain. Negative for dysuria, frequency and hematuria.  Neurological: Negative for dizziness and headaches.  All other systems reviewed and are negative.   Allergies  Cephalexin  Home Medications   Current Outpatient Rx  Name  Route  Sig  Dispense  Refill  . acetaminophen (TYLENOL) 500 MG tablet   Oral   Take 1,000 mg by mouth every 6 (six) hours as needed for pain.         Marland Kitchen ALPRAZolam (XANAX) 0.5 MG tablet   Oral   Take 0.5 mg by mouth every 6 (six) hours as needed. anxiety         . escitalopram (LEXAPRO) 10 MG tablet   Oral   Take 10 mg by mouth daily.         . fenofibrate (TRICOR) 145 MG tablet   Oral   Take 145 mg by mouth daily.         Marland Kitchen glimepiride (AMARYL) 2 MG tablet   Oral   Take 2 mg by mouth daily before breakfast.         . insulin glargine (LANTUS) 100 UNIT/ML injection   Subcutaneous   Inject 10 Units into the skin at bedtime.         Marland Kitchen lisinopril (PRINIVIL,ZESTRIL) 2.5 MG tablet   Oral   Take 2.5 mg by mouth daily.         Marland Kitchen  metFORMIN (GLUCOPHAGE) 1000 MG tablet   Oral   Take 1,000 mg by mouth 2 (two) times daily with a meal.         . ondansetron (ZOFRAN ODT) 8 MG disintegrating tablet   Oral   Take 1 tablet (8 mg total) by mouth every 8 (eight) hours as needed for nausea.   20 tablet   0   . oxyCODONE-acetaminophen (PERCOCET/ROXICET) 5-325 MG per tablet   Oral   Take 1 tablet by mouth every 4 (four) hours as needed for pain.   20 tablet   0   . pantoprazole (PROTONIX) 20 MG tablet   Oral   Take 20 mg by mouth daily.         . propranolol (INDERAL LA) 120 MG 24 hr capsule   Oral   Take 120 mg by mouth every morning.            BP 116/90  Pulse 68  Temp(Src) 96.8 F (36 C) (Oral)  Resp 22  Ht 5\' 9"  (1.753 m)  Wt 190 lb (86.183 kg)  BMI 28.05 kg/m2  SpO2 99%  Physical Exam  Constitutional: He is oriented to person, place, and time. He appears well-developed and well-nourished. No distress.  HENT:   Head: Normocephalic and atraumatic.  Right Ear: Hearing normal.  Left Ear: Hearing normal.  Nose: Nose normal.  Mouth/Throat: Oropharynx is clear and moist and mucous membranes are normal.  Eyes: Conjunctivae and EOM are normal. Pupils are equal, round, and reactive to light.  Neck: Normal range of motion. Neck supple.  Cardiovascular: Regular rhythm, S1 normal and S2 normal.  Exam reveals no gallop and no friction rub.   No murmur heard. Pulmonary/Chest: Effort normal and breath sounds normal. No respiratory distress. He exhibits no tenderness.  Abdominal: Soft. Normal appearance and bowel sounds are normal. There is no hepatosplenomegaly. There is no tenderness. There is no rebound, no guarding, no tenderness at McBurney's point and negative Murphy's sign. No hernia.  Musculoskeletal: Normal range of motion.  Neurological: He is alert and oriented to person, place, and time. He has normal strength. No cranial nerve deficit or sensory deficit. Coordination normal. GCS eye subscore is 4. GCS verbal subscore is 5. GCS motor subscore is 6.  Skin: Skin is warm, dry and intact. No rash noted. No cyanosis.  Psychiatric: He has a normal mood and affect. His speech is normal and behavior is normal. Thought content normal.    ED Course  Procedures (including critical care time) DIAGNOSTIC STUDIES: Oxygen Saturation is 99% on RA, normal by my interpretation.    COORDINATION OF CARE: 3:16 PM - Discussed ED treatment with pt at bedside including urinalysis and pt agrees.    Labs Reviewed  URINALYSIS, ROUTINE W REFLEX MICROSCOPIC - Abnormal; Notable for the following:    Specific Gravity, Urine >1.030 (*)    Hgb urine dipstick MODERATE (*)    Ketones, ur TRACE (*)    All other components within normal limits  URINE MICROSCOPIC-ADD ON - Abnormal; Notable for the following:    Crystals CA OXALATE CRYSTALS (*)    All other components within normal limits   No results found.   Diagnosis:  Renal colic    MDM  This presents to the ER with complaints of right flank pain that started suddenly an hour and half before coming to the ER. Patient has had one previous kidney stone in the symptoms currently to seem like recurrent renal colic. Patient's urinalysis does  show microscopic hematuria without signs of infection. Patient is currently well controlled after he took the oxycodone at home. We'll continue oxycodone as needed. Patient to be given an injection of Toradol IM here before discharge to help prevent recurrent pain.     I personally performed the services described in this documentation, which was scribed in my presence. The recorded information has been reviewed and is accurate.   Theodore Crease, MD 01/31/13 [phone]

## 2013-02-19 ENCOUNTER — Other Ambulatory Visit (HOSPITAL_COMMUNITY): Payer: Self-pay | Admitting: Urology

## 2013-02-19 DIAGNOSIS — N23 Unspecified renal colic: Secondary | ICD-10-CM

## 2013-02-24 ENCOUNTER — Ambulatory Visit (HOSPITAL_COMMUNITY): Payer: BC Managed Care – PPO

## 2013-05-29 ENCOUNTER — Other Ambulatory Visit: Payer: Self-pay | Admitting: Cardiovascular Disease

## 2013-05-29 DIAGNOSIS — I498 Other specified cardiac arrhythmias: Secondary | ICD-10-CM

## 2013-05-29 LAB — PACEMAKER DEVICE OBSERVATION

## 2013-06-09 ENCOUNTER — Encounter: Payer: Self-pay | Admitting: *Deleted

## 2013-06-09 LAB — REMOTE PACEMAKER DEVICE
AL AMPLITUDE: 2.8 mv
AL IMPEDENCE PM: 460 Ohm
AL THRESHOLD: 0.5 V
ATRIAL PACING PM: 17.6
BAMS-0001: 165 {beats}/min
BATTERY VOLTAGE: 2.79 V
RV LEAD AMPLITUDE: 16 mv
RV LEAD IMPEDENCE PM: 569 Ohm
RV LEAD THRESHOLD: 0.625 V
VENTRICULAR PACING PM: 0.1

## 2013-06-22 ENCOUNTER — Telehealth: Payer: Self-pay | Admitting: Cardiovascular Disease

## 2013-06-22 NOTE — Telephone Encounter (Signed)
Call not generated from triage.  Forwarded to B. Lassiter, CMA and Tollie Pizza, CMA w/ devices.

## 2013-06-22 NOTE — Telephone Encounter (Signed)
Per Mrs. Faucett, someone called and left a message w/his mother and told him to make an appt. ASAP.  He has had no recent testing other than a remote pacemaker check.  Which Levora Angel has called and told him everything looked stable.  He does need to make an appt 12/14 for recheck of pacer and DR. C.  Voiced understanding.

## 2013-06-22 NOTE — Telephone Encounter (Signed)
Patient was called and told he needs to schedule an appointment ASAP with the physician.  Patient would like to know "what is going on).

## 2013-10-01 ENCOUNTER — Encounter: Payer: Self-pay | Admitting: *Deleted

## 2014-02-22 ENCOUNTER — Encounter: Payer: Self-pay | Admitting: Cardiology

## 2014-03-31 ENCOUNTER — Telehealth: Payer: Self-pay | Admitting: Cardiology

## 2014-03-31 NOTE — Telephone Encounter (Signed)
Spoke with pt mom and informed her that pt needs to manual transmission so monitor can receive updates.

## 2014-04-01 ENCOUNTER — Telehealth: Payer: Self-pay | Admitting: Cardiovascular Disease

## 2014-04-01 NOTE — Telephone Encounter (Signed)
Spoke with patient's mom and gave her the fee for PPM remote checks and also informed her that her [name] needs to be seen in office before sending remotes since his last visit on file was 05/29/13.  She was advised to have him call and make an appointment with Dr. Royann Shiversroitoru.

## 2014-04-01 NOTE — Telephone Encounter (Signed)
Pt says he no longer has insurance. He wants to know how much it will cost to get his pacemaker checked over the phone?

## 2014-04-09 ENCOUNTER — Encounter: Payer: Self-pay | Admitting: Cardiology

## 2014-06-10 ENCOUNTER — Encounter: Payer: Self-pay | Admitting: Cardiovascular Disease

## 2014-06-23 ENCOUNTER — Encounter: Payer: Self-pay | Admitting: *Deleted

## 2014-07-22 ENCOUNTER — Encounter: Payer: Self-pay | Admitting: Cardiovascular Disease

## 2014-07-22 ENCOUNTER — Ambulatory Visit (INDEPENDENT_AMBULATORY_CARE_PROVIDER_SITE_OTHER): Payer: BC Managed Care – PPO | Admitting: Cardiovascular Disease

## 2014-07-22 VITALS — BP 122/84 | HR 79 | Resp 16 | Ht 69.0 in | Wt 176.9 lb

## 2014-07-22 DIAGNOSIS — R000 Tachycardia, unspecified: Secondary | ICD-10-CM

## 2014-07-22 DIAGNOSIS — R001 Bradycardia, unspecified: Secondary | ICD-10-CM

## 2014-07-22 DIAGNOSIS — R55 Syncope and collapse: Secondary | ICD-10-CM

## 2014-07-22 DIAGNOSIS — Z950 Presence of cardiac pacemaker: Secondary | ICD-10-CM

## 2014-07-22 DIAGNOSIS — F418 Other specified anxiety disorders: Secondary | ICD-10-CM

## 2014-07-22 LAB — MDC_IDC_ENUM_SESS_TYPE_INCLINIC
Battery Remaining Longevity: 10.5
Battery Voltage: 2.79 V
Brady Statistic AP VP Percent: 0.1 % — CL
Brady Statistic AP VS Percent: 12.6 %
Brady Statistic AS VP Percent: 0.1 % — CL
Brady Statistic AS VS Percent: 87.3 %
Lead Channel Impedance Value: 487 Ohm
Lead Channel Impedance Value: 631 Ohm
Lead Channel Pacing Threshold Amplitude: 0.5 V
Lead Channel Pacing Threshold Amplitude: 0.5 V
Lead Channel Pacing Threshold Pulse Width: 0.4 ms
Lead Channel Pacing Threshold Pulse Width: 0.4 ms
Lead Channel Sensing Intrinsic Amplitude: 22 mV
Lead Channel Sensing Intrinsic Amplitude: 5.6 mV
Lead Channel Setting Pacing Amplitude: 1.5 V
Lead Channel Setting Pacing Amplitude: 2 V
Lead Channel Setting Pacing Pulse Width: 0.4 ms
Lead Channel Setting Sensing Sensitivity: 5.6 mV

## 2014-07-22 MED ORDER — CITALOPRAM HYDROBROMIDE 20 MG PO TABS: 20.0000 mg | ORAL_TABLET | Freq: Every day | ORAL | Status: DC

## 2014-07-22 MED ORDER — ESCITALOPRAM OXALATE 10 MG PO TABS: 10.0000 mg | ORAL_TABLET | Freq: Every day | ORAL | Status: DC

## 2014-07-22 NOTE — Patient Instructions (Signed)
A prescription has been sent to your pharmacy for Lexapro 10mg  take daily.  Remote monitoring is used to monitor your Pacemaker or ICD from home. This monitoring reduces the number of office visits required to check your device to one time per year. It allows us to monitor the functioning of your device to ensure it is working properly. You are scheduled for a device check from home on Arpil 30,2016. You may send your transmission at any time that day. If you have a wireless device, the transmission will be sent automatically. After your physician reviews your transmission, you will receive a postcard with your next transmission date.   Dr. Royann Shiversroitoru recommends that you schedule a follow-up appointment in: One year.

## 2014-07-23 ENCOUNTER — Encounter: Payer: Self-pay | Admitting: Cardiovascular Disease

## 2014-07-23 DIAGNOSIS — R000 Tachycardia, unspecified: Secondary | ICD-10-CM | POA: Insufficient documentation

## 2014-07-23 DIAGNOSIS — Z950 Presence of cardiac pacemaker: Secondary | ICD-10-CM | POA: Insufficient documentation

## 2014-07-23 DIAGNOSIS — R55 Syncope and collapse: Secondary | ICD-10-CM | POA: Insufficient documentation

## 2014-07-23 NOTE — Assessment & Plan Note (Signed)
This was treated in the past as "supraventricular tachycardia" with beta blockers, but I wonder whether we are not just recording sinus tachycardia. I don't think we need to start specific therapy, especially since he seems to be unaware of the episodes.

## 2014-07-23 NOTE — Assessment & Plan Note (Signed)
I agree with his desire to stop taking benzodiazepines, but anxiety seems to be a very big problem for him. He seemed to do well with Lexapro in the past so I prescribed this for him, but when he went to the pharmacy it costs more than $100 a month. Celexa was offered as an alternative. I really think he needs a primary care physician or psychiatrist specialist to handle this.

## 2014-07-23 NOTE — Assessment & Plan Note (Signed)
Normal device function. Even with the current simple settings the device was successfully prevented syncope. There seems to be no reason to turn on the sudden bradycardia response. Enroll in [facility] and check his device at least every 6 months remotely.

## 2014-07-23 NOTE — Progress Notes (Signed)
Patient ID: Theodore [name], male   DOB: [birth year], 48 y.o.   MRN: [id number]     Reason for office visit Neurocardiogenic syncope, pacemaker follow-up  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 Since pacemaker implantation he has not had any syncopal events. He had the impression that "sudden bradycardia response" was turned on but in fact the device is programmed with a lower rate of 60 bpm without rapid intervention for bradycardia. He has not had syncope or presyncope.  His chart reports a history of 15 episodes of syncope since childhood in a couple of episodes of cough syncope were witnessed during the hospitalization, associated with 6 second pauses on telemetry.  Interrogation of his pacemaker shows normal device function. He never paces the ventricle and has only about 12% atrial pacing. The device has recorded several episodes of supraventricular tachycardia with rates of 100 7980 bpm, most of which probably represent sinus tachycardia (if not all).  Battery longevity is estimated at greater than 10 years. Both leads are Medtronic 5076 leads and have excellent parameters. No device reprogramming was necessary today.  Most of the discussion today revolved around [name]'s problems with anxiety and his long-standing use of alprazolam. He would like to stop taking this medication, but is worried about how he will respond to it. His primary care physician prescribed Paxil, but he had multiple side effects from this medication and stopped it. He said take Lexapro years ago, apparently without side effects. He takes the medication once a day every day for years. He used to take propranolol for tachycardia, but this medication was discontinued sometime within the last couple of years.   He has insulin requiring type 2 diabetes mellitus and systemic hypertension both of which appear to be well treated. He is  only mildly overweight. He had an echocardiogram in 2009 that was a completely normal study. At that time he also had cardiac catheterization that showed normal coronary arteries. 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   Allergies  Allergen Reactions  . Cephalexin Hives    Current Outpatient Prescriptions  Medication Sig Dispense Refill  . ALPRAZolam (XANAX) 0.5 MG tablet Take 0.5 mg by mouth every 6 (six) hours as needed. anxiety      . Insulin Glargine (TOUJEO SOLOSTAR ) Inject 15 Units into the skin at bedtime.      Marland Kitchen. lisinopril (PRINIVIL,ZESTRIL) 2.5 MG tablet Take 2.5 mg by mouth daily.      . metFORMIN (GLUCOPHAGE) 1000 MG tablet Take 1,000 mg by mouth 2 (two) times daily with a meal.      . citalopram (CELEXA) 20 MG tablet Take 1 tablet (20 mg total) by mouth daily.  30 tablet  11   No current facility-administered medications for this visit.    Past Medical History  Diagnosis Date  . Pacemaker   . Anxiety   . Depression   . Bradycardia   . Diabetes mellitus   . Syncope 09/2007    2D Echo    Past Surgical History  Procedure Laterality Date  . Pacemaker insertion  10/21/2007    Dr Theodore [name]  . Shoulder surgery  1999  . Hand surgery    . Cardiac catheterization Right 10/20/2007    Family History  Problem Relation Age of Onset  . Diabetes Sister   . Diabetes Mother   . Coronary artery  disease Mother   . Hypertension Mother     History   Social History  . Marital Status: Married    Spouse Name: N/A    Number of Children: N/A  . Years of Education: N/A   Occupational History  . Not on file.   Social History Main Topics  . Smoking status: Former Smoker    Quit date: 10/17/2007  . Smokeless tobacco: Not on file  . Alcohol Use: No  . Drug Use: No  . Sexual Activity: Not on file   Other Topics Concern  . Not on file   Social History Narrative  . No narrative on file     Review of systems: Anxiety The patient specifically denies any chest pain at rest or with exertion, dyspnea at rest or with exertion, orthopnea, paroxysmal nocturnal dyspnea, syncope, palpitations, focal neurological deficits, intermittent claudication, lower extremity edema, unexplained weight gain, cough, hemoptysis or wheezing.  The patient also denies abdominal pain, nausea, vomiting, dysphagia, diarrhea, constipation, polyuria, polydipsia, dysuria, hematuria, frequency, urgency, abnormal bleeding or bruising, fever, chills, unexpected weight changes, mood swings, change in skin or hair texture, change in voice quality, auditory or visual problems, allergic reactions or rashes, new musculoskeletal complaints other than usual "aches and pains".   PHYSICAL EXAM BP 122/84  Pulse 79  Ht 5\' 9"  (1.753 m)  Wt 176 lb 14.4 oz (80.241 kg)  BMI 26.11 kg/m2  General: Alert, oriented x3, no distress Head: no evidence of trauma, PERRL, EOMI, no exophtalmos or lid lag, no myxedema, no xanthelasma; normal ears, nose and oropharynx Neck: normal jugular venous pulsations and no hepatojugular reflux; brisk carotid pulses without delay and no carotid bruits Chest: clear to auscultation, no signs of consolidation by percussion or palpation, normal fremitus, symmetrical and full respiratory excursions, healthy pacemaker site Cardiovascular: normal position and quality of the apical impulse, regular rhythm, normal first and second heart sounds, no murmurs, rubs or gallops Abdomen: no tenderness or distention, no masses by palpation, no abnormal pulsatility or arterial bruits, normal bowel sounds, no hepatosplenomegaly Extremities: no clubbing, cyanosis or edema; 2+ radial, ulnar and brachial pulses bilaterally; 2+ right femoral, posterior tibial and dorsalis pedis pulses; 2+ left femoral, posterior tibial and dorsalis pedis pulses; no subclavian or femoral bruits Neurological: grossly nonfocal   EKG:  NSR  Lipid Panel     Component Value Date/Time   CHOL 138 10/22/2011 0507   TRIG 359* 10/22/2011 0507   HDL 26* 10/22/2011 0507   CHOLHDL 5.3 10/22/2011 0507   VLDL 72* 10/22/2011 0507   LDLCALC 40 10/22/2011 0507    BMET    Component Value Date/Time   NA 138 12/14/2012 0921   K 4.3 12/14/2012 0921   CL 102 12/14/2012 0921   CO2 25 12/14/2012 0921   GLUCOSE 145* 12/14/2012 0921   BUN 17 12/14/2012 0921   CREATININE 1.30 12/14/2012 0921   CALCIUM 9.1 12/14/2012 0921   GFRNONAA 64* 12/14/2012 0921   GFRAA 74* 12/14/2012 0921     ASSESSMENT AND PLAN Pacemaker Normal device function. Even with the current simple settings the device was successfully prevented syncope. There seems to be no reason to turn on the sudden bradycardia response. Enroll in [facility] and check his device at least every 6 months remotely.  Depression with anxiety I agree with his desire to stop taking benzodiazepines, but anxiety seems to be a very big problem for him. He seemed to do well with Lexapro in the past so I prescribed this for  him, but when he went to the pharmacy it costs more than $100 a month. Celexa was offered as an alternative. I really think he needs a primary care physician or psychiatrist specialist to handle this.  Neurocardiogenic syncope Predominant cardiac inhibitory response, asymptomatic since pacemaker implantation.  Tachycardia This was treated in the past as "supraventricular tachycardia" with beta blockers, but I wonder whether we are not just recording sinus tachycardia. I don't think we need to start specific therapy, especially since he seems to be unaware of the episodes.   Orders Placed This Encounter  Procedures  . EKG 12-Lead   Meds ordered this encounter  Medications  . Insulin Glargine (TOUJEO SOLOSTAR [place])    Sig: Inject 15 Units into the skin at bedtime.  Marland Kitchen. DISCONTD: escitalopram (LEXAPRO) 10 MG tablet    Sig: Take 1 tablet (10 mg total) by mouth daily.    Dispense:  30  tablet    Refill:  6  . citalopram (CELEXA) 20 MG tablet    Sig: Take 1 tablet (20 mg total) by mouth daily.    Dispense:  30 tablet    Refill:  [address]11    Rage Beever  Lige Lakeman, MD, [facility] [phone] office [phone] pager

## 2014-07-23 NOTE — Assessment & Plan Note (Signed)
Predominant cardiac inhibitory response, asymptomatic since pacemaker implantation.

## 2014-08-02 ENCOUNTER — Encounter: Payer: Self-pay | Admitting: Cardiovascular Disease

## 2015-01-24 ENCOUNTER — Telehealth: Payer: Self-pay | Admitting: Cardiology

## 2015-01-24 ENCOUNTER — Encounter: Payer: Self-pay | Admitting: *Deleted

## 2015-01-24 NOTE — Telephone Encounter (Signed)
Attempted to confirm remote transmission with pt. No answer and was unable to leave a message.   

## 2015-02-01 ENCOUNTER — Encounter: Payer: Self-pay | Admitting: Cardiology

## 2015-04-07 ENCOUNTER — Encounter: Payer: Self-pay | Admitting: Cardiovascular Disease

## 2015-04-08 ENCOUNTER — Encounter: Payer: Self-pay | Admitting: Cardiovascular Disease

## 2015-04-21 ENCOUNTER — Ambulatory Visit (INDEPENDENT_AMBULATORY_CARE_PROVIDER_SITE_OTHER): Payer: 59 | Admitting: *Deleted

## 2015-04-21 DIAGNOSIS — R001 Bradycardia, unspecified: Secondary | ICD-10-CM

## 2015-04-21 LAB — CUP PACEART INCLINIC DEVICE CHECK
Battery Impedance: 381 Ohm
Battery Remaining Longevity: 120 mo
Battery Voltage: 2.79 V
Brady Statistic AP VP Percent: 0 %
Brady Statistic AP VS Percent: 7 %
Brady Statistic AS VP Percent: 0 %
Brady Statistic AS VS Percent: 93 %
Date Time Interrogation Session: 20160728160732
Lead Channel Impedance Value: 473 Ohm
Lead Channel Impedance Value: 620 Ohm
Lead Channel Pacing Threshold Amplitude: 0.5 V
Lead Channel Pacing Threshold Amplitude: 0.5 V
Lead Channel Pacing Threshold Pulse Width: 0.4 ms
Lead Channel Pacing Threshold Pulse Width: 0.4 ms
Lead Channel Sensing Intrinsic Amplitude: 22.4 mV
Lead Channel Sensing Intrinsic Amplitude: 4 mV
Lead Channel Setting Pacing Amplitude: 2 V
Lead Channel Setting Pacing Amplitude: 2.5 V
Lead Channel Setting Pacing Pulse Width: 0.4 ms
Lead Channel Setting Sensing Sensitivity: 5.6 mV

## 2015-04-21 NOTE — Progress Notes (Signed)
Pacemaker check in clinic. Normal device function. Thresholds, sensing, impedances consistent with previous measurements. Device programmed to maximize longevity. 7 mode switches--longest was 13 minutes 35 seconds. 24 high ventricular rates noted--longest was 2 minutes 9 seconds. Device programmed at appropriate safety margins. Histogram distribution appropriate for patient activity level. Device programmed to optimize intrinsic conduction. Estimated longevity 10 years. ROV in 6 mths w/MC.

## 2015-05-12 ENCOUNTER — Encounter: Payer: Self-pay | Admitting: Cardiovascular Disease

## 2015-09-23 ENCOUNTER — Encounter: Payer: Self-pay | Admitting: *Deleted

## 2015-10-26 ENCOUNTER — Encounter: Payer: Self-pay | Admitting: *Deleted

## 2015-11-23 ENCOUNTER — Encounter: Payer: Self-pay | Admitting: *Deleted

## 2016-02-14 ENCOUNTER — Encounter: Payer: Self-pay | Admitting: Cardiovascular Disease

## 2016-02-14 ENCOUNTER — Ambulatory Visit (INDEPENDENT_AMBULATORY_CARE_PROVIDER_SITE_OTHER): Payer: BLUE CROSS/BLUE SHIELD | Admitting: Cardiovascular Disease

## 2016-02-14 VITALS — BP 119/74 | HR 79 | Ht 69.0 in | Wt 172.0 lb

## 2016-02-14 DIAGNOSIS — R55 Syncope and collapse: Secondary | ICD-10-CM | POA: Diagnosis not present

## 2016-02-14 DIAGNOSIS — Z794 Long term (current) use of insulin: Secondary | ICD-10-CM | POA: Diagnosis not present

## 2016-02-14 DIAGNOSIS — IMO0001 Reserved for inherently not codable concepts without codable children: Secondary | ICD-10-CM

## 2016-02-14 DIAGNOSIS — E1165 Type 2 diabetes mellitus with hyperglycemia: Secondary | ICD-10-CM | POA: Diagnosis not present

## 2016-02-14 DIAGNOSIS — Z950 Presence of cardiac pacemaker: Secondary | ICD-10-CM | POA: Diagnosis not present

## 2016-02-14 LAB — CUP PACEART INCLINIC DEVICE CHECK
Battery Impedance: 555 Ohm
Battery Remaining Longevity: 90 mo
Battery Voltage: 2.79 V
Brady Statistic AP VP Percent: 0.1 % — CL
Brady Statistic AP VS Percent: 11.7 %
Brady Statistic AS VP Percent: 0.1 % — CL
Brady Statistic AS VS Percent: 88.2 %
Date Time Interrogation Session: 20170523103156
Implantable Lead Implant Date: 20090127
Implantable Lead Implant Date: 20090127
Implantable Lead Location: 753859
Implantable Lead Location: 753860
Implantable Lead Model: 5076
Implantable Lead Model: 5076
Lead Channel Impedance Value: 473 Ohm
Lead Channel Impedance Value: 535 Ohm
Lead Channel Pacing Threshold Amplitude: 0.5 V
Lead Channel Pacing Threshold Amplitude: 0.5 V
Lead Channel Pacing Threshold Pulse Width: 0.4 ms
Lead Channel Pacing Threshold Pulse Width: 0.4 ms
Lead Channel Sensing Intrinsic Amplitude: 22.4 mV
Lead Channel Sensing Intrinsic Amplitude: 5.6 mV
Lead Channel Setting Pacing Amplitude: 2 V
Lead Channel Setting Pacing Amplitude: 2.5 V
Lead Channel Setting Pacing Pulse Width: 0.4 ms
Lead Channel Setting Sensing Sensitivity: 5.6 mV

## 2016-02-14 NOTE — Progress Notes (Signed)
Patient ID: Theodore [name], male   DOB: 03/24/1966, 50 y.o.   MRN: [id number]    Cardiology Office Note    Date:  02/14/2016   ID:  Theodore [name], DOB 03/24/1966, MRN [id number]  PCP:  No PCP Per Patient  Cardiologist:   Theodore FairMihai Careena Degraffenreid, MD   Chief Complaint  Patient presents with  . Follow-up    pacer check    History of Present Illness:  Theodore [name] is a 50 y.o. male with a previous history of numerous episodes of syncope associated with prolonged sinus pauses, which completely resolved following implantation of a dual-chamber permanent pacemaker in 2009. He probably had neurally mediated syncope and continues to have occasional episodes of "anxiety" and tachycardia which might be a form fruste of neurally mediated events that never reach complete syncope. He returns for routine follow-up and has not had much in the way of dizziness, lightheadedness and has not had syncope. Device interrogation shows normal function. His dual-chamber Medtronic Adapta device was implanted in 2009 and still has 7.5 years of estimated generator longevity. He has 12% atrial pacing and does not have ventricular pacing.  Theodore [name] triggers for syncope seem to be consistently related to the observation or discussion of health problems and medical procedures. For example he once passed out watching a person wearing oxygen mask and another time passed out discussing a newly diagnosed lung cancer in his uncle. Interestingly, his son has recently began experiencing numerous episodes of syncope and is seeing Dr. Lewayne BuntingGregg [name], is soon to have an implantable loop recorder.  He has insulin requiring type 2 diabetes mellitus and systemic hypertension both of which appear to be well treated. He is minimally overweight. He had an echocardiogram in 2009 that was a completely normal study. At that time he also had cardiac catheterization that showed normal coronary arteries. He briefly took warfarin in 2009 for upper extremity  basilic vein thrombosis that was probably pacemaker related.  Past Medical History  Diagnosis Date  . Pacemaker     Permanent pacemaker placement by Dr. Molly Maduroobert McQueen--10/21/2007--a Medtronic Adapta--serial number #[id number]  . Anxiety   . Depression   . Bradycardia   . Diabetes mellitus   . Syncope 09/2007    2D Echo--History of frequent episodes of syncope beginning at 50 years of age--usually associated with some type of vagal issue but he does completely lose consciousness    Past Surgical History  Procedure Laterality Date  . Pacemaker insertion  10/21/2007    Dr Jenne CampusMcQueen  . Shoulder surgery  1999  . Hand surgery    . Cardiac catheterization Right 10/20/2007    Current Medications: Outpatient Prescriptions Prior to Visit  Medication Sig Dispense Refill  . citalopram (CELEXA) 20 MG tablet Take 1 tablet (20 mg total) by mouth daily. 30 tablet 11  . fenofibrate 160 MG tablet Take 160 mg by mouth daily.    Marland Kitchen. ALPRAZolam (XANAX) 0.5 MG tablet Take 0.5 mg by mouth every 6 (six) hours as needed. anxiety    . Canagliflozin-Metformin HCl (INVOKAMET) 50-1000 MG TABS Take 1 tablet by mouth. Take 1 tablet by mouth with breakfast and 1/2 tablet by mouth with evening meal    . Insulin Glargine (TOUJEO SOLOSTAR [place]) Inject 15 Units into the skin at bedtime.    Marland Kitchen. lisinopril (PRINIVIL,ZESTRIL) 2.5 MG tablet Take 2.5 mg by mouth daily.     No facility-administered medications prior to visit.     Allergies:   Cephalexin   Social  History   Social History  . Marital Status: Married    Spouse Name: N/A  . Number of Children: N/A  . Years of Education: N/A   Social History Main Topics  . Smoking status: Former Smoker    Quit date: 10/17/2007  . Smokeless tobacco: None  . Alcohol Use: No  . Drug Use: No  . Sexual Activity: Not Asked   Other Topics Concern  . None   Social History Narrative     Family History:  The patient's family history includes Coronary artery disease (age  of onset: 65's) in his mother; Diabetes in his mother and sister; Hypertension in his mother.   ROS:   Please see the history of present illness.    ROS All other systems reviewed and are negative.   PHYSICAL EXAM:   VS:  BP 119/74 mmHg  Pulse 79  Ht 5\' 9"  (1.753 m)  Wt 78.019 kg (172 lb)  BMI 25.39 kg/m2   GEN: Well nourished, well developed, in no acute distress HEENT: normal Neck: no JVD, carotid bruits, or masses Cardiac: RRR; no murmurs, rubs, or gallops,no edema, healthy left subclavian pacemaker site  Respiratory:  clear to auscultation bilaterally, normal work of breathing GI: soft, nontender, nondistended, + BS MS: no deformity or atrophy Skin: warm and dry, no rash Neuro:  Alert and Oriented x 3, Strength and sensation are intact Psych: euthymic mood, full affect  Wt Readings from Last 3 Encounters:  02/14/16 78.019 kg (172 lb)  07/22/14 80.241 kg (176 lb 14.4 oz)  01/31/13 86.183 kg (190 lb)      Studies/Labs Reviewed:   EKG:  EKG is ordered today.  The ekg ordered today demonstrates Atrial paced, ventricular sensed rhythm. QTC 417 ms.  Recent Labs: No results found for requested labs within last 365 days.   Lipid Panel    Component Value Date/Time   CHOL 138 10/22/2011 0507   TRIG 359* 10/22/2011 0507   HDL 26* 10/22/2011 0507   CHOLHDL 5.3 10/22/2011 0507   VLDL 72* 10/22/2011 0507   LDLCALC 40 10/22/2011 0507      ASSESSMENT:    1. Neurocardiogenic syncope   2. Pacemaker   3. Uncontrolled type 2 diabetes mellitus without complication, with long-term current use of insulin (HCC)      PLAN:  In order of problems listed above:  1. No recurrence of syncope since his maker implantation. Occasional episodes of "anxiety" and rapid heartbeats are probably the prodrome of neurally mediated events that never reach fruition. 2. Normal pacemaker function, continue remote downloads every 3 months and yearly office visit 3. I don't have any recent lab  results regarding renal function or adequacy of diabetes control. We'll try to get these from his primary care physician    Medication Adjustments/Labs and Tests Ordered: Current medicines are reviewed at length with the patient today.  Concerns regarding medicines are outlined above.  Medication changes, Labs and Tests ordered today are listed in the Patient Instructions below. Patient Instructions  Dr Royann Shivers recommends that you continue on your current medications as directed. Please refer to the Current Medication list given to you today.  Remote monitoring is used to monitor your Pacemaker of ICD from home. This monitoring reduces the number of office visits required to check your device to one time per year. It allows [place] to keep an eye on the functioning of your device to ensure it is working properly. You are scheduled for a device check from home  on Thursday, August 24th, 2017. You may send your transmission at any time that day. If you have a wireless device, the transmission will be sent automatically. After your physician reviews your transmission, you will receive a postcard with your next transmission date.   Dr Royann Shivers recommends that you schedule a follow-up appointment in 12 months with a pacemaker check. You will receive a reminder letter in the mail two months in advance. If you don't receive a letter, please call our office to schedule the follow-up appointment.  If you need a refill on your cardiac medications before your next appointment, please call your pharmacy.     Signed, Theodore Fair, MD  02/14/2016 5:46 PM    [facility] [address] [place], [place], [place]  [address] Phone: [phone]; Fax: [phone]

## 2016-02-14 NOTE — Patient Instructions (Signed)
Dr Royann Shiversroitoru recommends that you continue on your current medications as directed. Please refer to the Current Medication list given to you today.  Remote monitoring is used to monitor your Pacemaker of ICD from home. This monitoring reduces the number of office visits required to check your device to one time per year. It allows us to keep an eye on the functioning of your device to ensure it is working properly. You are scheduled for a device check from home on Thursday, August 24th, 2017. You may send your transmission at any time that day. If you have a wireless device, the transmission will be sent automatically. After your physician reviews your transmission, you will receive a postcard with your next transmission date.   Dr Royann Shiversroitoru recommends that you schedule a follow-up appointment in 12 months with a pacemaker check. You will receive a reminder letter in the mail two months in advance. If you don't receive a letter, please call our office to schedule the follow-up appointment.  If you need a refill on your cardiac medications before your next appointment, please call your pharmacy.

## 2016-02-21 ENCOUNTER — Telehealth: Payer: Self-pay

## 2016-02-21 NOTE — Telephone Encounter (Signed)
-----   Message from Thurmon FairMihai Croitoru, MD sent at 02/14/2016  5:46 PM EDT ----- No PCP listed. Who is giving him the prescriptions for insulin and other medications?. They must have done labs. Need his lipids, hemoglobin A1c, metabolic panel, please. If these have not been performed in the last 6 months please order them.

## 2016-02-21 NOTE — Telephone Encounter (Signed)
Called patient, [facility]. Called to [facility], they need a records release to send us any records. Patient agreed to come up to the office to fill out a records release form.

## 2016-03-05 ENCOUNTER — Encounter: Payer: Self-pay | Admitting: Cardiovascular Disease

## 2016-03-12 ENCOUNTER — Encounter (HOSPITAL_COMMUNITY): Payer: Self-pay

## 2016-03-12 ENCOUNTER — Emergency Department (HOSPITAL_COMMUNITY)
Admission: EM | Admit: 2016-03-12 | Discharge: 2016-03-13 | Disposition: A | Discharge status: Home / Self Care | Payer: BLUE CROSS/BLUE SHIELD | Attending: Emergency Medicine | Admitting: Emergency Medicine

## 2016-03-12 DIAGNOSIS — Z794 Long term (current) use of insulin: Secondary | ICD-10-CM | POA: Diagnosis not present

## 2016-03-12 DIAGNOSIS — Z79899 Other long term (current) drug therapy: Secondary | ICD-10-CM | POA: Insufficient documentation

## 2016-03-12 DIAGNOSIS — Z87891 Personal history of nicotine dependence: Secondary | ICD-10-CM | POA: Diagnosis not present

## 2016-03-12 DIAGNOSIS — Z950 Presence of cardiac pacemaker: Secondary | ICD-10-CM | POA: Insufficient documentation

## 2016-03-12 DIAGNOSIS — E119 Type 2 diabetes mellitus without complications: Secondary | ICD-10-CM | POA: Diagnosis not present

## 2016-03-12 DIAGNOSIS — R1011 Right upper quadrant pain: Secondary | ICD-10-CM | POA: Insufficient documentation

## 2016-03-12 DIAGNOSIS — R1901 Right upper quadrant abdominal swelling, mass and lump: Secondary | ICD-10-CM

## 2016-03-12 LAB — URINALYSIS, ROUTINE W REFLEX MICROSCOPIC
Bilirubin Urine: NEGATIVE
Glucose, UA: >1000 mg/dL — AB
Hgb urine dipstick: NEGATIVE
Ketones, ur: NEGATIVE mg/dL
Leukocytes, UA: NEGATIVE
Nitrite: NEGATIVE
Protein, ur: NEGATIVE mg/dL
Specific Gravity, Urine: 1.035 — ABNORMAL HIGH (ref 1.005–1.030)
pH: 6 (ref 5.0–8.0)

## 2016-03-12 LAB — COMPREHENSIVE METABOLIC PANEL
ALT: 17 U/L (ref 17–63)
AST: 21 U/L (ref 15–41)
Albumin: 3.7 g/dL (ref 3.5–5.0)
Alkaline Phosphatase: 99 U/L (ref 38–126)
Anion gap: 5 (ref 5–15)
BUN: 15 mg/dL (ref 6–20)
CO2: 24 mmol/L (ref 22–32)
Calcium: 8.7 mg/dL — ABNORMAL LOW (ref 8.9–10.3)
Chloride: 107 mmol/L (ref 101–111)
Creatinine, Ser: 1.62 mg/dL — ABNORMAL HIGH (ref 0.61–1.24)
GFR calc Af Amer: 56 mL/min — ABNORMAL LOW (ref >60)
GFR calc non Af Amer: 48 mL/min — ABNORMAL LOW (ref >60)
Glucose, Bld: 393 mg/dL — ABNORMAL HIGH (ref 65–99)
Potassium: 4 mmol/L (ref 3.5–5.1)
Sodium: 136 mmol/L (ref 135–145)
Total Bilirubin: 0.6 mg/dL (ref 0.3–1.2)
Total Protein: 6 g/dL — ABNORMAL LOW (ref 6.5–8.1)

## 2016-03-12 LAB — CBC
HCT: 43.4 % (ref 39.0–52.0)
Hemoglobin: 14.6 g/dL (ref 13.0–17.0)
MCH: 29.9 pg (ref 26.0–34.0)
MCHC: 33.6 g/dL (ref 30.0–36.0)
MCV: 88.8 fL (ref 78.0–100.0)
Platelets: 167 10*3/uL (ref 150–400)
RBC: 4.89 MIL/uL (ref 4.22–5.81)
RDW: 12.1 % (ref 11.5–15.5)
WBC: 7.4 10*3/uL (ref 4.0–10.5)

## 2016-03-12 LAB — LIPASE, BLOOD: Lipase: 47 U/L (ref 11–51)

## 2016-03-12 LAB — URINE MICROSCOPIC-ADD ON
Bacteria, UA: NONE SEEN
RBC / HPF: NONE SEEN RBC/hpf (ref 0–5)
Squamous Epithelial / LPF: NONE SEEN
Urine-Other: NONE SEEN
WBC, UA: NONE SEEN WBC/hpf (ref 0–5)

## 2016-03-12 NOTE — ED Notes (Signed)
Pt complaining of right side swelling and pain for several months. Pt states some diarrhea, no nausea/vomiting or fevers.

## 2016-03-13 ENCOUNTER — Emergency Department (HOSPITAL_COMMUNITY): Payer: BLUE CROSS/BLUE SHIELD

## 2016-03-13 MED ORDER — HYDROCODONE-ACETAMINOPHEN 5-325 MG PO TABS: 1.0000 | ORAL_TABLET | Freq: Four times a day (QID) | ORAL | Status: DC | PRN

## 2016-03-13 NOTE — ED Notes (Signed)
Patient lying in bed states that abdominal pain has subsided.  Stated that sitting in waiting room in a chair made his abdominal pain hurt more.

## 2016-03-13 NOTE — ED Notes (Signed)
Patient Alert and oriented X4. Stable and ambulatory. Patient verbalized understanding of the discharge instructions.  Patient belongings were taken by the patient.  

## 2016-03-13 NOTE — ED Provider Notes (Signed)
RUQ pain x weeks, getting worse Pending US Likely stones not cystitis  Pending US  Ultrasound negative for gall stones or other abnormalities.   Re-examination: The patient is non-tender to palpation at this time. There is asymmetric, right sided swelling to the lateral abdominal wall when standing, which the patient states is recurrent and associated with his pain episodes. Discussed at length that referral GI would be next step in finding the answers to his recurrent symptoms. All questions answered.   Elpidio AnisShari Shanikqua Zarzycki, PA-C 03/13/16 [id number]  Gilda Creasehristopher J Pollina, MD 03/14/16 [phone]

## 2016-03-13 NOTE — Discharge Instructions (Signed)

## 2016-03-13 NOTE — ED Provider Notes (Signed)
CSN: [id number]     Arrival date & time 03/12/16  2022 History   First MD Initiated Contact with Patient 03/12/16 2328     Chief Complaint  Patient presents with  . Abdominal Pain     (Consider location/radiation/quality/duration/timing/severity/associated sxs/prior Treatment) HPI Comments: Patient presents to the emergency department with chief complaint of right upper quadrant abdominal pain. Patient states that he has been having intermittent symptoms for the past several months. He states that it is gradually worsening. He reports that the pain radiates to his back. He experiences more pain after eating. He denies any fevers or chills. Denies any nausea, or vomiting. States that he has had some intermittent diarrhea. Denies any other associated symptoms. He has not taken anything for his symptoms. There are no other modifying factors.  The history is provided by the patient. No language interpreter was used.    Past Medical History  Diagnosis Date  . Pacemaker     Permanent pacemaker placement by Dr. Molly Maduro McQueen--10/21/2007--a Medtronic Adapta--serial number #[id number]  . Anxiety   . Depression   . Bradycardia   . Diabetes mellitus   . Syncope 09/2007    2D Echo--History of frequent episodes of syncope beginning at 50 years of age--usually associated with some type of vagal issue but he does completely lose consciousness   Past Surgical History  Procedure Laterality Date  . Pacemaker insertion  10/21/2007    Dr Jenne Campus  . Shoulder surgery  1999  . Hand surgery    . Cardiac catheterization Right 10/20/2007   Family History  Problem Relation Age of Onset  . Diabetes Sister   . Diabetes Mother   . Coronary artery disease Mother 91's  . Hypertension Mother    Social History  Substance Use Topics  . Smoking status: Former Smoker    Quit date: 10/17/2007  . Smokeless tobacco: None  . Alcohol Use: No    Review of Systems  Gastrointestinal: Positive for abdominal  pain.  All other systems reviewed and are negative.     Allergies  Cephalexin  Home Medications   Prior to Admission medications   Medication Sig Start Date End Date Taking? Authorizing Provider  citalopram (CELEXA) 20 MG tablet Take 1 tablet (20 mg total) by mouth daily. 07/22/14   Mihai Croitoru, MD  fenofibrate 160 MG tablet Take 160 mg by mouth daily.    Historical Provider, MD  INVOKAMET 150-500 MG TABS Take 1 tablet by mouth daily. 02/08/16   Historical Provider, MD  lisinopril (PRINIVIL,ZESTRIL) 5 MG tablet Take 1 tablet by mouth daily. 02/08/16   Historical Provider, MD  TOUJEO SOLOSTAR 300 UNIT/ML SOPN Inject 25 Units into the skin daily. 01/13/16   Historical Provider, MD   BP 106/80 mmHg  Pulse 69  Temp(Src) 98.7 F (37.1 C) (Oral)  Ht  (1.727 m)  Wt 78.501 kg  BMI 26.32 kg/m2  SpO2 95% Physical Exam  Constitutional: He is oriented to person, place, and time. He appears well-developed and well-nourished.  HENT:  Head: Normocephalic and atraumatic.  Eyes: Conjunctivae and EOM are normal. Pupils are equal, round, and reactive to light. Right eye exhibits no discharge. Left eye exhibits no discharge. No scleral icterus.  Neck: Normal range of motion. Neck supple. No JVD present.  Cardiovascular: Normal rate, regular rhythm and normal heart sounds.  Exam reveals no gallop and no friction rub.   No murmur heard. Pulmonary/Chest: Effort normal and breath sounds normal. No respiratory distress. He has no wheezes.  He has no rales. He exhibits no tenderness.  Abdominal: Soft. He exhibits no distension and no mass. There is tenderness. There is no rebound and no guarding.  Mild tenderness to right upper quadrant, no other focal abdominal tenderness  Musculoskeletal: Normal range of motion. He exhibits no edema or tenderness.  Neurological: He is alert and oriented to person, place, and time.  Skin: Skin is warm and dry.  Psychiatric: He has a normal mood and affect. His  behavior is normal. Judgment and thought content normal.  Nursing note and vitals reviewed.   ED Course  Procedures (including critical care time) Labs Review Labs Reviewed  COMPREHENSIVE METABOLIC PANEL - Abnormal; Notable for the following:    Glucose, Bld 393 (*)    Creatinine, Ser 1.62 (*)    Calcium 8.7 (*)    Total Protein 6.0 (*)    GFR calc non Af Amer 48 (*)    GFR calc Af Amer 56 (*)    All other components within normal limits  URINALYSIS, ROUTINE W REFLEX MICROSCOPIC (NOT AT [facility]) - Abnormal; Notable for the following:    Specific Gravity, Urine 1.035 (*)    Glucose, UA >1000 (*)    All other components within normal limits  LIPASE, BLOOD  CBC  URINE MICROSCOPIC-ADD ON    Imaging Review No results found. I have personally reviewed and evaluated these images and lab results as part of my medical decision-making.   EKG Interpretation None      MDM   Final diagnoses:  None    Patient with intermittent right upper quadrant pain. Pain is worsened after eating. Concern for possible cholelithiasis. Will check right upper quadrant ultrasound.  LFTs and lipase are normal.  Patient signed out to [place], PA-C.  Plan: Follow-up on US.   Roxy Horsemanobert Rosi Secrist, PA-C 03/13/16 0037  Gilda Creasehristopher J Pollina, MD 03/13/16 [id number]

## 2016-05-15 ENCOUNTER — Ambulatory Visit (INDEPENDENT_AMBULATORY_CARE_PROVIDER_SITE_OTHER): Payer: BLUE CROSS/BLUE SHIELD | Admitting: *Deleted

## 2016-05-15 ENCOUNTER — Telehealth: Payer: Self-pay | Admitting: Cardiology

## 2016-05-15 DIAGNOSIS — R001 Bradycardia, unspecified: Secondary | ICD-10-CM

## 2016-05-15 DIAGNOSIS — Z950 Presence of cardiac pacemaker: Secondary | ICD-10-CM

## 2016-05-15 NOTE — Telephone Encounter (Signed)
LMOVM reminding pt to send remote transmission.   

## 2016-05-18 NOTE — Progress Notes (Signed)
Remote pacemaker transmission.   

## 2016-05-23 ENCOUNTER — Encounter: Payer: Self-pay | Admitting: Cardiology

## 2016-05-24 LAB — CUP PACEART REMOTE DEVICE CHECK
Battery Impedance: 657 Ohm
Battery Remaining Longevity: 86 mo
Battery Voltage: 2.79 V
Brady Statistic AP VP Percent: 0 %
Brady Statistic AP VS Percent: 9 %
Brady Statistic AS VP Percent: 0 %
Brady Statistic AS VS Percent: 91 %
Date Time Interrogation Session: 20170823223351
Implantable Lead Implant Date: 20090127
Implantable Lead Implant Date: 20090127
Implantable Lead Location: 753859
Implantable Lead Location: 753860
Implantable Lead Model: 5076
Implantable Lead Model: 5076
Lead Channel Impedance Value: 473 Ohm
Lead Channel Impedance Value: 551 Ohm
Lead Channel Pacing Threshold Amplitude: 0.625 V
Lead Channel Pacing Threshold Amplitude: 0.625 V
Lead Channel Pacing Threshold Pulse Width: 0.4 ms
Lead Channel Pacing Threshold Pulse Width: 0.4 ms
Lead Channel Sensing Intrinsic Amplitude: 16 mV
Lead Channel Sensing Intrinsic Amplitude: 2.8 mV
Lead Channel Setting Pacing Amplitude: 2 V
Lead Channel Setting Pacing Amplitude: 2.5 V
Lead Channel Setting Pacing Pulse Width: 0.4 ms
Lead Channel Setting Sensing Sensitivity: 5.6 mV

## 2016-06-11 ENCOUNTER — Encounter: Payer: Self-pay | Admitting: General Practice

## 2016-06-11 LAB — HM COLONOSCOPY

## 2016-08-14 ENCOUNTER — Telehealth: Payer: Self-pay | Admitting: Cardiology

## 2016-08-14 ENCOUNTER — Encounter: Payer: BLUE CROSS/BLUE SHIELD | Admitting: *Deleted

## 2016-08-14 NOTE — Telephone Encounter (Signed)
LMOVM reminding pt to send remote transmission.   

## 2016-08-15 ENCOUNTER — Encounter: Payer: Self-pay | Admitting: Cardiology

## 2016-10-01 ENCOUNTER — Ambulatory Visit (INDEPENDENT_AMBULATORY_CARE_PROVIDER_SITE_OTHER): Payer: BLUE CROSS/BLUE SHIELD | Admitting: *Deleted

## 2016-10-01 DIAGNOSIS — R001 Bradycardia, unspecified: Secondary | ICD-10-CM

## 2016-10-02 NOTE — Progress Notes (Signed)
Remote pacemaker transmission.   

## 2016-10-03 ENCOUNTER — Encounter: Payer: Self-pay | Admitting: Cardiology

## 2016-10-04 LAB — CUP PACEART REMOTE DEVICE CHECK
Battery Impedance: 733 Ohm
Battery Remaining Longevity: 81 mo
Battery Voltage: 2.78 V
Brady Statistic AP VP Percent: 0 %
Brady Statistic AP VS Percent: 10 %
Brady Statistic AS VP Percent: 0 %
Brady Statistic AS VS Percent: 90 %
Date Time Interrogation Session: 20180106164423
Implantable Lead Implant Date: 20090127
Implantable Lead Implant Date: 20090127
Implantable Lead Location: 753859
Implantable Lead Location: 753860
Implantable Lead Model: 5076
Implantable Lead Model: 5076
Implantable Pulse Generator Implant Date: 20090127
Lead Channel Impedance Value: 480 Ohm
Lead Channel Impedance Value: 602 Ohm
Lead Channel Pacing Threshold Amplitude: 0.5 V
Lead Channel Pacing Threshold Amplitude: 0.625 V
Lead Channel Pacing Threshold Pulse Width: 0.4 ms
Lead Channel Pacing Threshold Pulse Width: 0.4 ms
Lead Channel Setting Pacing Amplitude: 2 V
Lead Channel Setting Pacing Amplitude: 2.5 V
Lead Channel Setting Pacing Pulse Width: 0.4 ms
Lead Channel Setting Sensing Sensitivity: 5.6 mV

## 2016-12-31 ENCOUNTER — Encounter: Payer: BLUE CROSS/BLUE SHIELD | Admitting: *Deleted

## 2016-12-31 ENCOUNTER — Telehealth: Payer: Self-pay | Admitting: Cardiology

## 2016-12-31 NOTE — Telephone Encounter (Signed)
LMOVM reminding pt to send remote transmission.   

## 2017-01-04 ENCOUNTER — Encounter: Payer: Self-pay | Admitting: Cardiology

## 2017-01-07 ENCOUNTER — Ambulatory Visit (INDEPENDENT_AMBULATORY_CARE_PROVIDER_SITE_OTHER): Payer: BLUE CROSS/BLUE SHIELD | Admitting: *Deleted

## 2017-01-07 DIAGNOSIS — R001 Bradycardia, unspecified: Secondary | ICD-10-CM

## 2017-01-08 LAB — CUP PACEART REMOTE DEVICE CHECK
Battery Impedance: 811 Ohm
Battery Remaining Longevity: 77 mo
Battery Voltage: 2.79 V
Brady Statistic AP VP Percent: 0 %
Brady Statistic AP VS Percent: 10 %
Brady Statistic AS VP Percent: 0 %
Brady Statistic AS VS Percent: 90 %
Date Time Interrogation Session: 20180416080158
Implantable Lead Implant Date: 20090127
Implantable Lead Implant Date: 20090127
Implantable Lead Location: 753859
Implantable Lead Location: 753860
Implantable Lead Model: 5076
Implantable Lead Model: 5076
Implantable Pulse Generator Implant Date: 20090127
Lead Channel Impedance Value: 473 Ohm
Lead Channel Impedance Value: 560 Ohm
Lead Channel Pacing Threshold Amplitude: 0.5 V
Lead Channel Pacing Threshold Amplitude: 0.625 V
Lead Channel Pacing Threshold Pulse Width: 0.4 ms
Lead Channel Pacing Threshold Pulse Width: 0.4 ms
Lead Channel Setting Pacing Amplitude: 2 V
Lead Channel Setting Pacing Amplitude: 2.5 V
Lead Channel Setting Pacing Pulse Width: 0.4 ms
Lead Channel Setting Sensing Sensitivity: 5.6 mV

## 2017-01-08 NOTE — Progress Notes (Signed)
Remote pacemaker transmission.   

## 2017-01-09 ENCOUNTER — Encounter: Payer: Self-pay | Admitting: Cardiology

## 2017-04-08 ENCOUNTER — Telehealth: Payer: Self-pay | Admitting: Cardiology

## 2017-04-08 ENCOUNTER — Encounter: Payer: BLUE CROSS/BLUE SHIELD | Admitting: *Deleted

## 2017-04-08 NOTE — Telephone Encounter (Signed)
LMOVM reminding pt to send remote transmission.   

## 2017-04-11 ENCOUNTER — Encounter: Payer: Self-pay | Admitting: Cardiology

## 2017-04-18 ENCOUNTER — Ambulatory Visit (INDEPENDENT_AMBULATORY_CARE_PROVIDER_SITE_OTHER): Payer: BLUE CROSS/BLUE SHIELD | Admitting: *Deleted

## 2017-04-18 DIAGNOSIS — R001 Bradycardia, unspecified: Secondary | ICD-10-CM

## 2017-04-19 ENCOUNTER — Encounter: Payer: Self-pay | Admitting: Cardiology

## 2017-04-19 NOTE — Progress Notes (Signed)
Remote pacemaker transmission.   

## 2017-05-20 LAB — CUP PACEART REMOTE DEVICE CHECK
Battery Impedance: 836 Ohm
Battery Remaining Longevity: 76 mo
Battery Voltage: 2.78 V
Brady Statistic AP VP Percent: 0 %
Brady Statistic AP VS Percent: 9 %
Brady Statistic AS VP Percent: 0 %
Brady Statistic AS VS Percent: 91 %
Date Time Interrogation Session: 20180726185535
Implantable Lead Implant Date: 20090127
Implantable Lead Implant Date: 20090127
Implantable Lead Location: 753859
Implantable Lead Location: 753860
Implantable Lead Model: 5076
Implantable Lead Model: 5076
Implantable Pulse Generator Implant Date: 20090127
Lead Channel Impedance Value: 487 Ohm
Lead Channel Impedance Value: 542 Ohm
Lead Channel Pacing Threshold Amplitude: 0.5 V
Lead Channel Pacing Threshold Amplitude: 0.625 V
Lead Channel Pacing Threshold Pulse Width: 0.4 ms
Lead Channel Pacing Threshold Pulse Width: 0.4 ms
Lead Channel Setting Pacing Amplitude: 2 V
Lead Channel Setting Pacing Amplitude: 2.5 V
Lead Channel Setting Pacing Pulse Width: 0.4 ms
Lead Channel Setting Sensing Sensitivity: 5.6 mV

## 2017-07-18 ENCOUNTER — Telehealth: Payer: Self-pay | Admitting: Cardiology

## 2017-07-18 ENCOUNTER — Encounter: Payer: BLUE CROSS/BLUE SHIELD | Admitting: *Deleted

## 2017-07-18 NOTE — Telephone Encounter (Signed)
LMOVM reminding pt to send remote transmission.   

## 2017-07-19 ENCOUNTER — Encounter: Payer: Self-pay | Admitting: Cardiology

## 2017-08-19 ENCOUNTER — Ambulatory Visit (INDEPENDENT_AMBULATORY_CARE_PROVIDER_SITE_OTHER): Payer: Self-pay | Admitting: *Deleted

## 2017-08-19 DIAGNOSIS — R001 Bradycardia, unspecified: Secondary | ICD-10-CM

## 2017-08-20 NOTE — Progress Notes (Signed)
Remote pacemaker transmission.   

## 2017-08-22 LAB — CUP PACEART REMOTE DEVICE CHECK
Battery Impedance: 942 Ohm
Battery Remaining Longevity: 71 mo
Battery Voltage: 2.78 V
Brady Statistic AP VP Percent: 0 %
Brady Statistic AP VS Percent: 9 %
Brady Statistic AS VP Percent: 0 %
Brady Statistic AS VS Percent: 91 %
Date Time Interrogation Session: 20181123223446
Implantable Lead Implant Date: 20090127
Implantable Lead Implant Date: 20090127
Implantable Lead Location: 753859
Implantable Lead Location: 753860
Implantable Lead Model: 5076
Implantable Lead Model: 5076
Implantable Pulse Generator Implant Date: 20090127
Lead Channel Impedance Value: 473 Ohm
Lead Channel Impedance Value: 580 Ohm
Lead Channel Pacing Threshold Amplitude: 0.5 V
Lead Channel Pacing Threshold Amplitude: 0.625 V
Lead Channel Pacing Threshold Pulse Width: 0.4 ms
Lead Channel Pacing Threshold Pulse Width: 0.4 ms
Lead Channel Sensing Intrinsic Amplitude: 16 mV
Lead Channel Sensing Intrinsic Amplitude: 2.8 mV
Lead Channel Setting Pacing Amplitude: 2 V
Lead Channel Setting Pacing Amplitude: 2.5 V
Lead Channel Setting Pacing Pulse Width: 0.4 ms
Lead Channel Setting Sensing Sensitivity: 5.6 mV

## 2017-08-23 ENCOUNTER — Encounter: Payer: Self-pay | Admitting: Cardiology

## 2017-10-01 ENCOUNTER — Other Ambulatory Visit (HOSPITAL_BASED_OUTPATIENT_CLINIC_OR_DEPARTMENT_OTHER): Payer: Self-pay

## 2017-10-01 DIAGNOSIS — G4730 Sleep apnea, unspecified: Secondary | ICD-10-CM

## 2017-10-01 DIAGNOSIS — R0683 Snoring: Secondary | ICD-10-CM

## 2017-10-20 ENCOUNTER — Ambulatory Visit (HOSPITAL_BASED_OUTPATIENT_CLINIC_OR_DEPARTMENT_OTHER): Payer: BLUE CROSS/BLUE SHIELD | Attending: Specialist | Admitting: Internal Medicine

## 2017-10-20 VITALS — Ht 68.0 in | Wt 175.0 lb

## 2017-10-20 DIAGNOSIS — G4730 Sleep apnea, unspecified: Secondary | ICD-10-CM

## 2017-10-20 DIAGNOSIS — R0683 Snoring: Secondary | ICD-10-CM | POA: Diagnosis present

## 2017-10-26 DIAGNOSIS — G4730 Sleep apnea, unspecified: Secondary | ICD-10-CM | POA: Diagnosis not present

## 2017-10-26 NOTE — Procedures (Signed)
    Patient Name: Theodore [name], Couper Study Date: 10/20/2017 Gender: Male D.O.B: Jul 19, 1966 Age (years): 51 Referring Provider: Nicolasa Duckingichard Pavelock Height (inches): 68 Interpreting Physician: Jetty Duhamellinton Aidynn Polendo MD, ABSM Weight (lbs): 175 RPSGT: Wylie HailDavis, Rico BMI: 27 MRN: [id number] Neck Size: 16.00 <br> <br> CLINICAL INFORMATION Sleep Study Type: NPSG  Indication for sleep study: Snoring  Epworth Sleepiness Score:  8  SLEEP STUDY TECHNIQUE As per the AASM Manual for the Scoring of Sleep and Associated Events v2.3 (April 2016) with a hypopnea requiring 4% desaturations.  The channels recorded and monitored were frontal, central and occipital EEG, electrooculogram (EOG), submentalis EMG (chin), nasal and oral airflow, thoracic and abdominal wall motion, anterior tibialis EMG, snore microphone, electrocardiogram, and pulse oximetry.  MEDICATIONS Medications self-administered by patient taken the night of the study : none reported  SLEEP ARCHITECTURE The study was initiated at 10:10:00 PM and ended at 4:21:56 AM.  Sleep onset time was 15.0 minutes and the sleep efficiency was 90.6%. The total sleep time was 337.0 minutes.  Stage REM latency was 14.0 minutes.  The patient spent 9.79% of the night in stage N1 sleep, 66.62% in stage N2 sleep, 0.59% in stage N3 and 23.00% in REM.  Alpha intrusion was absent.  Supine sleep was 13.75%.  RESPIRATORY PARAMETERS The overall apnea/hypopnea index (AHI) was 3.7 per hour. There were 3 total apneas, including 3 obstructive, 0 central and 0 mixed apneas. There were 18 hypopneas and 7 RERAs.  The AHI during Stage REM sleep was 5.4 per hour.  AHI while supine was 20.7 per hour.  The mean oxygen saturation was 93.41%. The minimum SpO2 during sleep was 87.00%.  loud snoring was noted during this study.  CARDIAC DATA The 2 lead EKG demonstrated sinus rhythm. The mean heart rate was 71.11 beats per minute. Other EKG findings include: None.  LEG  MOVEMENT DATA The total PLMS were 0 with a resulting PLMS index of 0.00. Associated arousal with leg movement index was 0.0 .  IMPRESSIONS - Occasional obstructive sleep apnea events occurred during this study, within normal limits (AHI = 3.7/h). - No significant central sleep apnea occurred during this study (CAI = 0.0/h). - Mild oxygen desaturation was noted during this study (Min O2 = 87.00%, Mean 93.4%). - The patient snored with loud snoring volume. - No cardiac abnormalities were noted during this study. - Clinically significant periodic limb movements did not occur during sleep. No significant associated arousals.  DIAGNOSIS - Primary snoring  RECOMMENDATIONS - Positional therapy avoiding supine position during sleep. - Be careful with alcohol, sedatives and other CNS depressants that may worsen sleep apnea and disrupt normal sleep architecture. - Sleep hygiene should be reviewed to assess factors that may improve sleep quality. - Weight management and regular exercise should be initiated or continued if appropriate.  [Electronically signed] 10/26/2017 04:53 PM  Jetty Duhamellinton Brownie Nehme MD, ABSM Diplomate, American Board of Sleep Medicine   NPI: [id number]                         Jetty Duhamellinton Lakera Viall Diplomate, American Board of Sleep Medicine  ELECTRONICALLY SIGNED ON:  10/26/2017, 4:51 PM [facility] PH: [phone]   FX: [phone] ACCREDITED BY THE AMERICAN ACADEMY OF SLEEP MEDICINE

## 2017-11-18 ENCOUNTER — Telehealth: Payer: Self-pay | Admitting: Cardiology

## 2017-11-18 ENCOUNTER — Encounter: Payer: BLUE CROSS/BLUE SHIELD | Admitting: *Deleted

## 2017-11-18 NOTE — Telephone Encounter (Signed)
Attempted to confirm remote transmission with pt. No answer and was unable to leave a message.   

## 2017-11-20 ENCOUNTER — Encounter: Payer: Self-pay | Admitting: Cardiology

## 2017-11-29 ENCOUNTER — Ambulatory Visit (INDEPENDENT_AMBULATORY_CARE_PROVIDER_SITE_OTHER): Payer: BLUE CROSS/BLUE SHIELD | Admitting: *Deleted

## 2017-11-29 DIAGNOSIS — R001 Bradycardia, unspecified: Secondary | ICD-10-CM

## 2017-12-02 ENCOUNTER — Telehealth: Payer: Self-pay | Admitting: *Deleted

## 2017-12-02 ENCOUNTER — Encounter: Payer: Self-pay | Admitting: Cardiology

## 2017-12-02 NOTE — Telephone Encounter (Signed)
I made Theodore [name] aware of AF in December- he reports that was a month after his mom passed away and he felt "odd" one day but didn't know what it would be. This episode started at 9:23pm and lasted 19 hours- he doesn't think the AF was the cause of his symptom. I made him aware that his risk was very low and that we would likely monitor the AF. He knows that he is due to see Dr. Royann Shiversroitoru and I gave him the number to call and schedule an appointment.

## 2017-12-02 NOTE — Progress Notes (Signed)
Remote pacemaker transmission.   

## 2017-12-02 NOTE — Telephone Encounter (Signed)
Transmission received 11/28/17. New AF noted in December 2018 lasting >19 hrs. CHADSVASC is 1 for history of diabetes. A message was sent to Dr. Royann Shiversroitoru to make him aware. [name] is overdue to see Dr. Salena Saner and a message was sent to his CMA and scheduler to get him an appointment.  Dr. Salena Saner wanted us to make Theodore [name] aware of AF.  LMOM to return call to [facility].

## 2017-12-12 ENCOUNTER — Ambulatory Visit (INDEPENDENT_AMBULATORY_CARE_PROVIDER_SITE_OTHER): Payer: BLUE CROSS/BLUE SHIELD | Admitting: Cardiovascular Disease

## 2017-12-12 ENCOUNTER — Encounter: Payer: Self-pay | Admitting: Cardiovascular Disease

## 2017-12-12 VITALS — BP 132/72 | HR 63 | Ht 68.0 in | Wt 175.4 lb

## 2017-12-12 DIAGNOSIS — E119 Type 2 diabetes mellitus without complications: Secondary | ICD-10-CM

## 2017-12-12 DIAGNOSIS — Z950 Presence of cardiac pacemaker: Secondary | ICD-10-CM | POA: Diagnosis not present

## 2017-12-12 DIAGNOSIS — I480 Paroxysmal atrial fibrillation: Secondary | ICD-10-CM

## 2017-12-12 DIAGNOSIS — R55 Syncope and collapse: Secondary | ICD-10-CM

## 2017-12-12 DIAGNOSIS — R001 Bradycardia, unspecified: Secondary | ICD-10-CM

## 2017-12-12 NOTE — Patient Instructions (Addendum)
Dr Royann Shiversroitoru has recommended making the following medication changes: 1. START Aspirin 81 mg - take 1 tablet by mouth daily  Remote monitoring is used to monitor your Pacemaker or ICD from home. This monitoring reduces the number of office visits required to check your device to one time per year. It allows us to keep an eye on the functioning of your device to ensure it is working properly. You are scheduled for a device check from home on Monday, June 10th, 2019. You may send your transmission at any time that day. If you have a wireless device, the transmission will be sent automatically. After your physician reviews your transmission, you will receive a notification with your next transmission date.  Dr Royann Shiversroitoru recommends that you schedule a follow-up appointment in 12 months with a pacemaker check. You will receive a reminder letter in the mail two months in advance. If you don't receive a letter, please call our office to schedule the follow-up appointment.  If you need a refill on your cardiac medications before your next appointment, please call your pharmacy.

## 2017-12-12 NOTE — Progress Notes (Signed)
Patient ID: Theodore [name], male   DOB: [birth year], 52 y.o.   MRN: [id number]    Cardiology Office Note    Date:  12/12/2017   ID:  BONNIE ROIG, DOB [birth year], MRN [id number]  PCP:  [facility]  Cardiologist:   Sanda Klein, MD   Chief Complaint  Patient presents with  . Follow-up    pt reports 19hr episode on 09/10/17. reports no complaints.    History of Present Illness:  Theodore [name] is a 52 y.o. male with a previous history of numerous episodes of syncope associated with prolonged sinus pauses, which completely resolved following implantation of a dual-chamber permanent pacemaker in 2009. He probably had neurally mediated syncope.  His pacemaker recorded an episode of asymptomatic atrial fibrillation that occurred on December 18 and lasted for over 19 hours.  Was asymptomatic and has not recurred since, although his device has recorded tachycardia with 1: 1 AV conduction, most likely sinus tachycardia.  Around that date, his mother had an unexpected stroke and died not long afterwards.  He has not had episodes of syncope since we last met.  The patient specifically denies any chest pain at rest exertion, dyspnea at rest or with exertion, orthopnea, paroxysmal nocturnal dyspnea, syncope, palpitations, focal neurological deficits, intermittent claudication, lower extremity edema, unexplained weight gain, cough, hemoptysis or wheezing.  Pacemaker interrogation shows normal function.  Dual-chamber Medtronic Adapta device was implanted in 2009 and has 6 years of battery longevity.  He only has 9% atrial pacing and no ventricular pacing.  Lead parameters are excellent.  [name]'s triggers for syncope seem to be consistently related to the observation or discussion of health problems and medical procedures. For example he once passed out watching a person wearing oxygen mask and another time passed out discussing a newly diagnosed lung cancer in his uncle. His son  has syncope.  He has insulin requiring type 2 diabetes mellitus and systemic hypertension both of which appear to be well treated. He is minimally overweight. He had an echocardiogram in 2009 that was a completely normal study. At that time he also had cardiac catheterization that showed normal coronary arteries. 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  Past Medical History:  Diagnosis Date  . Anxiety   . Bradycardia   . Depression   . Diabetes mellitus   . Pacemaker    Permanent pacemaker placement by Dr. Herbie Baltimore McQueen--10/21/2007--a Medtronic Adapta--serial number #[id number]  . Syncope 09/2007   2D Echo--History of frequent episodes of syncope beginning at 52 years of age--usually associated with some type of vagal issue but he does completely lose consciousness    Past Surgical History:  Procedure Laterality Date  . CARDIAC CATHETERIZATION Right 10/20/2007  . HAND SURGERY    . PACEMAKER INSERTION  10/21/2007   Dr Tami Ribas  . SHOULDER SURGERY  1999    Current Medications: Outpatient Medications Prior to Visit  Medication Sig Dispense Refill  . citalopram (CELEXA) 20 MG tablet Take 1 tablet (20 mg total) by mouth daily. 30 tablet 11  . fenofibrate 160 MG tablet Take 160 mg by mouth daily.    Marland Kitchen HYDROcodone-acetaminophen (NORCO/VICODIN) 5-325 MG tablet Take 1-2 tablets by mouth every 6 (six) hours as needed. 15 tablet 0  . INVOKAMET 150-500 MG TABS Take 1 tablet by mouth daily.  2  . lisinopril (PRINIVIL,ZESTRIL) 5 MG tablet Take 1 tablet by mouth daily.  2  .  TOUJEO SOLOSTAR 300 UNIT/ML SOPN Inject 25 Units into the skin daily.  1   No facility-administered medications prior to visit.      Allergies:   Cephalexin   Social History   Socioeconomic History  . Marital status: Married    Spouse name: Not on file  . Number of children: Not on file  . Years of education: Not on file  . Highest education  level: Not on file  Occupational History  . Not on file  Social Needs  . Financial resource strain: Not on file  . Food insecurity:    Worry: Not on file    Inability: Not on file  . Transportation needs:    Medical: Not on file    Non-medical: Not on file  Tobacco Use  . Smoking status: Former Smoker    Last attempt to quit: 10/17/2007    Years since quitting: 10.1  . Smokeless tobacco: Former Network engineer and Sexual Activity  . Alcohol use: No  . Drug use: No  . Sexual activity: Not on file  Lifestyle  . Physical activity:    Days per week: Not on file    Minutes per session: Not on file  . Stress: Not on file  Relationships  . Social connections:    Talks on phone: Not on file    Gets together: Not on file    Attends religious service: Not on file    Active member of club or organization: Not on file    Attends meetings of clubs or organizations: Not on file    Relationship status: Not on file  Other Topics Concern  . Not on file  Social History Narrative  . Not on file     Family History:  The patient's family history includes Coronary artery disease (age of onset: 33's) in his mother; Diabetes in his mother and sister; Hypertension in his mother.   ROS:   Please see the history of present illness.    ROS All other systems reviewed and are negative.   PHYSICAL EXAM:   VS:  BP 132/72   Pulse 63   Ht 5' 8" (1.727 m)   Wt 175 lb 6.4 oz (79.6 kg)   BMI 26.67 kg/m     General: Alert, oriented x3, no distress, appears lean and fit.  Healthy left subclavian pacemaker site. Head: no evidence of trauma, PERRL, EOMI, no exophtalmos or lid lag, no myxedema, no xanthelasma; normal ears, nose and oropharynx Neck: normal jugular venous pulsations and no hepatojugular reflux; brisk carotid pulses without delay and no carotid bruits Chest: clear to auscultation, no signs of consolidation by percussion or palpation, normal fremitus, symmetrical and full respiratory  excursions Cardiovascular: normal position and quality of the apical impulse, regular rhythm, normal first and second heart sounds, no murmurs, rubs or gallops Abdomen: no tenderness or distention, no masses by palpation, no abnormal pulsatility or arterial bruits, normal bowel sounds, no hepatosplenomegaly Extremities: no clubbing, cyanosis or edema; 2+ radial, ulnar and brachial pulses bilaterally; 2+ right femoral, posterior tibial and dorsalis pedis pulses; 2+ left femoral, posterior tibial and dorsalis pedis pulses; no subclavian or femoral bruits Neurological: grossly nonfocal Psych: Normal mood and affect   Wt Readings from Last 3 Encounters:  12/12/17 175 lb 6.4 oz (79.6 kg)  10/20/17 175 lb (79.4 kg)  03/12/16 173 lb 1 oz (78.5 kg)      Studies/Labs Reviewed:   EKG:  EKG is ordered today.  The ekg ordered  today demonstrates both atrial paced and sinus rhythm with native AV conduction otherwise normal tracing. QTC 415 ms.  Recent Labs: No results found for requested labs within last 8760 hours.   Lipid Panel    Component Value Date/Time   CHOL 138 10/22/2011 0507   TRIG 359 (H) 10/22/2011 0507   HDL 26 (L) 10/22/2011 0507   CHOLHDL 5.3 10/22/2011 0507   VLDL 72 (H) 10/22/2011 0507   LDLCALC 40 10/22/2011 0507      ASSESSMENT:    No diagnosis found.   PLAN:  In order of problems listed above:  1. AFib: Only one episode of self terminated atrial fibrillation was recorded last December. CHADSVasc 1 (DM).  No history of systemic embolism.  Discussed stroke risk. Recommend aspirin for the time being.  If he will develop additional risk factors, will then need anticoagulation.  He was under a lot of emotional stress when the arrhythmia occurred in the last December.  At this point do not recommend any antiarrhythmics. 2. Syncope: No recurrence of syncope since his maker implantation. Occasional episodes of "anxiety" and rapid heartbeats are probably the prodrome of  neurally mediated events that never reach fruition. 3. PM: Normal pacemaker function, continue remote downloads every 3 months and yearly office visit 4. DM: Reports that both glycemic control and lipid profile by his primary care physician were in the desirable range.    Medication Adjustments/Labs and Tests Ordered: Current medicines are reviewed at length with the patient today.  Concerns regarding medicines are outlined above.  Medication changes, Labs and Tests ordered today are listed in the Patient Instructions below. There are no Patient Instructions on file for this visit.   Signed, Sanda Klein, MD  12/12/2017 11:15 AM    [facility] [address], [place], [place]  [address] Phone: [phone]; Fax: [phone]

## 2017-12-15 LAB — CUP PACEART REMOTE DEVICE CHECK
Battery Impedance: 993 Ohm
Battery Remaining Longevity: 68 mo
Battery Voltage: 2.78 V
Brady Statistic AP VP Percent: 0 %
Brady Statistic AP VS Percent: 9 %
Brady Statistic AS VP Percent: 0 %
Brady Statistic AS VS Percent: 91 %
Date Time Interrogation Session: 20190307224724
Implantable Lead Implant Date: 20090127
Implantable Lead Implant Date: 20090127
Implantable Lead Location: 753859
Implantable Lead Location: 753860
Implantable Lead Model: 5076
Implantable Lead Model: 5076
Implantable Pulse Generator Implant Date: 20090127
Lead Channel Impedance Value: 480 Ohm
Lead Channel Impedance Value: 529 Ohm
Lead Channel Pacing Threshold Amplitude: 0.5 V
Lead Channel Pacing Threshold Amplitude: 0.625 V
Lead Channel Pacing Threshold Pulse Width: 0.4 ms
Lead Channel Pacing Threshold Pulse Width: 0.4 ms
Lead Channel Setting Pacing Amplitude: 2 V
Lead Channel Setting Pacing Amplitude: 2.5 V
Lead Channel Setting Pacing Pulse Width: 0.4 ms
Lead Channel Setting Sensing Sensitivity: 5.6 mV

## 2018-01-13 LAB — CUP PACEART INCLINIC DEVICE CHECK
Battery Impedance: 941 Ohm
Battery Remaining Longevity: 71 mo
Battery Voltage: 2.78 V
Brady Statistic AP VP Percent: 0 %
Brady Statistic AP VS Percent: 9 %
Brady Statistic AS VP Percent: 0 %
Brady Statistic AS VS Percent: 91 %
Date Time Interrogation Session: 20190321145551
Implantable Lead Implant Date: 20090127
Implantable Lead Implant Date: 20090127
Implantable Lead Location: 753859
Implantable Lead Location: 753860
Implantable Lead Model: 5076
Implantable Lead Model: 5076
Implantable Pulse Generator Implant Date: 20090127
Lead Channel Impedance Value: 487 Ohm
Lead Channel Impedance Value: 563 Ohm
Lead Channel Pacing Threshold Amplitude: 0.625 V
Lead Channel Pacing Threshold Amplitude: 0.75 V
Lead Channel Pacing Threshold Pulse Width: 0.4 ms
Lead Channel Pacing Threshold Pulse Width: 0.4 ms
Lead Channel Setting Pacing Amplitude: 2 V
Lead Channel Setting Pacing Amplitude: 2.5 V
Lead Channel Setting Pacing Pulse Width: 0.4 ms
Lead Channel Setting Sensing Sensitivity: 5.6 mV

## 2018-03-03 ENCOUNTER — Encounter: Payer: BLUE CROSS/BLUE SHIELD | Admitting: *Deleted

## 2018-03-03 ENCOUNTER — Telehealth: Payer: Self-pay

## 2018-03-03 NOTE — Telephone Encounter (Signed)
I spoke with the patient about this.

## 2018-03-05 ENCOUNTER — Encounter: Payer: Self-pay | Admitting: Cardiology

## 2018-03-10 ENCOUNTER — Ambulatory Visit (INDEPENDENT_AMBULATORY_CARE_PROVIDER_SITE_OTHER): Payer: BLUE CROSS/BLUE SHIELD | Admitting: *Deleted

## 2018-03-10 ENCOUNTER — Other Ambulatory Visit: Payer: Self-pay | Admitting: Physician Assistant

## 2018-03-10 DIAGNOSIS — I480 Paroxysmal atrial fibrillation: Secondary | ICD-10-CM | POA: Diagnosis not present

## 2018-03-10 DIAGNOSIS — R634 Abnormal weight loss: Secondary | ICD-10-CM

## 2018-03-10 DIAGNOSIS — R197 Diarrhea, unspecified: Secondary | ICD-10-CM

## 2018-03-10 DIAGNOSIS — R109 Unspecified abdominal pain: Secondary | ICD-10-CM

## 2018-03-10 DIAGNOSIS — R1031 Right lower quadrant pain: Secondary | ICD-10-CM

## 2018-03-10 NOTE — Progress Notes (Signed)
Remote pacemaker transmission.   

## 2018-03-11 ENCOUNTER — Encounter: Payer: Self-pay | Admitting: Cardiology

## 2018-03-12 LAB — CUP PACEART REMOTE DEVICE CHECK
Battery Impedance: 1046 Ohm
Battery Remaining Longevity: 66 mo
Battery Voltage: 2.78 V
Brady Statistic AP VP Percent: 0 %
Brady Statistic AP VS Percent: 8 %
Brady Statistic AS VP Percent: 0 %
Brady Statistic AS VS Percent: 92 %
Date Time Interrogation Session: 20190615104604
Implantable Lead Implant Date: 20090127
Implantable Lead Implant Date: 20090127
Implantable Lead Location: 753859
Implantable Lead Location: 753860
Implantable Lead Model: 5076
Implantable Lead Model: 5076
Implantable Pulse Generator Implant Date: 20090127
Lead Channel Impedance Value: 494 Ohm
Lead Channel Impedance Value: 521 Ohm
Lead Channel Pacing Threshold Amplitude: 0.5 V
Lead Channel Pacing Threshold Amplitude: 0.625 V
Lead Channel Pacing Threshold Pulse Width: 0.4 ms
Lead Channel Pacing Threshold Pulse Width: 0.4 ms
Lead Channel Setting Pacing Amplitude: 2 V
Lead Channel Setting Pacing Amplitude: 2.5 V
Lead Channel Setting Pacing Pulse Width: 0.4 ms
Lead Channel Setting Sensing Sensitivity: 5.6 mV

## 2018-03-18 DIAGNOSIS — G8929 Other chronic pain: Secondary | ICD-10-CM

## 2018-03-18 DIAGNOSIS — M549 Dorsalgia, unspecified: Secondary | ICD-10-CM

## 2018-03-18 HISTORY — DX: Other chronic pain: G89.29

## 2018-03-18 HISTORY — DX: Other chronic pain: M54.9

## 2018-03-25 ENCOUNTER — Encounter: Payer: Self-pay | Admitting: Radiology

## 2018-03-25 ENCOUNTER — Ambulatory Visit
Admission: RE | Admit: 2018-03-25 | Discharge: 2018-03-25 | Disposition: A | Discharge status: Home / Self Care | Payer: BLUE CROSS/BLUE SHIELD | Source: Ambulatory Visit | Attending: Physician Assistant | Admitting: Physician Assistant

## 2018-03-25 DIAGNOSIS — R634 Abnormal weight loss: Secondary | ICD-10-CM

## 2018-03-25 DIAGNOSIS — R197 Diarrhea, unspecified: Secondary | ICD-10-CM

## 2018-03-25 DIAGNOSIS — R1031 Right lower quadrant pain: Secondary | ICD-10-CM

## 2018-03-25 DIAGNOSIS — R109 Unspecified abdominal pain: Secondary | ICD-10-CM

## 2018-03-25 MED ORDER — IOHEXOL 300 MG/ML  SOLN
100.0000 mL | Freq: Once | INTRAMUSCULAR | Status: AC | PRN
  Administered 2018-03-25: 100 mL via INTRAVENOUS

## 2018-04-21 ENCOUNTER — Other Ambulatory Visit: Payer: Self-pay | Admitting: Orthopedic Surgery

## 2018-04-21 DIAGNOSIS — M5136 Other intervertebral disc degeneration, lumbar region: Secondary | ICD-10-CM

## 2018-04-22 ENCOUNTER — Telehealth: Payer: Self-pay | Admitting: Nurse Practitioner

## 2018-04-22 NOTE — Telephone Encounter (Signed)
Phone call to patient to verify medication list and allergies for myelogram procedure. Pt informed he will need to stop Celexa 48hrs prior to myelogram appointment time. Pt verbalized understanding.

## 2018-05-02 ENCOUNTER — Ambulatory Visit
Admission: RE | Admit: 2018-05-02 | Discharge: 2018-05-02 | Disposition: A | Discharge status: Home / Self Care | Payer: BLUE CROSS/BLUE SHIELD | Source: Ambulatory Visit | Attending: Orthopedic Surgery | Admitting: Orthopedic Surgery

## 2018-05-02 DIAGNOSIS — M5136 Other intervertebral disc degeneration, lumbar region: Secondary | ICD-10-CM

## 2018-05-02 MED ORDER — IOPAMIDOL (ISOVUE-M 200) INJECTION 41%
15.0000 mL | Freq: Once | INTRAMUSCULAR | Status: AC
  Administered 2018-05-02: 15 mL via INTRATHECAL

## 2018-05-02 MED ORDER — DIAZEPAM 5 MG PO TABS
10.0000 mg | ORAL_TABLET | Freq: Once | ORAL | Status: AC
  Administered 2018-05-02: 5 mg via ORAL

## 2018-05-02 NOTE — Discharge Instructions (Signed)
Myelogram Discharge Instructions  1. Go home and rest quietly for the next 24 hours.  It is important to lie flat for the next 24 hours.  Get up only to go to the restroom.  You may lie in the bed or on a couch on your back, your stomach, your left side or your right side.  You may have one pillow under your head.  You may have pillows between your knees while you are on your side or under your knees while you are on your back.  2. DO NOT drive today.  Recline the seat as far back as it will go, while still wearing your seat belt, on the way home.  3. You may get up to go to the bathroom as needed.  You may sit up for 10 minutes to eat.  You may resume your normal diet and medications unless otherwise indicated.  Drink lots of extra fluids today and tomorrow.  4. The incidence of headache, nausea, or vomiting is about 5% (one in 20 patients).  If you develop a headache, lie flat and drink plenty of fluids until the headache goes away.  Caffeinated beverages may be helpful.  If you develop severe nausea and vomiting or a headache that does not go away with flat bed rest, call [phone].  5. You may resume normal activities after your 24 hours of bed rest is over; however, do not exert yourself strongly or do any heavy lifting tomorrow. If when you get up you have a headache when standing, go back to bed and force fluids for another 24 hours.  6. Call your physician for a follow-up appointment.  The results of your myelogram will be sent directly to your physician by the following day.  7. If you have any questions or if complications develop after you arrive home, please call [phone].  Discharge instructions have been explained to the patient.  The patient, or the person responsible for the patient, fully understands these instructions.   YOU MAY RESTART YOUR CELEXA TOMORROW 05/03/2018 AT 09:30AM.

## 2018-05-10 DIAGNOSIS — M48062 Spinal stenosis, lumbar region with neurogenic claudication: Secondary | ICD-10-CM

## 2018-05-10 HISTORY — DX: Spinal stenosis, lumbar region with neurogenic claudication: M48.062

## 2018-05-22 ENCOUNTER — Encounter: Payer: Self-pay | Admitting: Dietician

## 2018-05-22 ENCOUNTER — Encounter: Payer: BLUE CROSS/BLUE SHIELD | Attending: Nurse Practitioner | Admitting: Dietician

## 2018-05-22 DIAGNOSIS — IMO0001 Reserved for inherently not codable concepts without codable children: Secondary | ICD-10-CM

## 2018-05-22 DIAGNOSIS — Z713 Dietary counseling and surveillance: Secondary | ICD-10-CM | POA: Insufficient documentation

## 2018-05-22 DIAGNOSIS — E1165 Type 2 diabetes mellitus with hyperglycemia: Secondary | ICD-10-CM

## 2018-05-22 DIAGNOSIS — E119 Type 2 diabetes mellitus without complications: Secondary | ICD-10-CM | POA: Insufficient documentation

## 2018-05-22 NOTE — Progress Notes (Signed)
Diabetes Self-Management Education  Visit Type: First/Initial  Appt. Start Time: 1530 Appt. End Time: 1630  05/22/2018  [name], identified by name and date of birth, is a 52 y.o. male with a diagnosis of Diabetes: Type 2.   ASSESSMENT   Diabetes Self-Management Education - 05/22/18 1540      Visit Information   Visit Type  First/Initial      Initial Visit   Diabetes Type  Type 2    Are you currently following a meal plan?  No    Are you taking your medications as prescribed?  Yes    Date Diagnosed  2010      Health Coping   How would you rate your overall health?  Fair      Psychosocial Assessment   Patient Belief/Attitude about Diabetes  Motivated to manage diabetes    What is the last grade level you completed in school?  GED      Complications   How often do you check your blood sugar?  1-2 times/day    Have you had a dilated eye exam in the past 12 months?  Yes    Have you had a dental exam in the past 12 months?  Yes    Are you checking your feet?  Yes    How many days per week are you checking your feet?  7      Dietary Intake   Breakfast  8 am - 2-3 scrambled eggs, country fried steak with gravy, 3 pieces of bacon, water with sugary-free flavoring     Snack (morning)  usually none    Lunch  11:15 am - hot dog or salad from cafeteria at work    Snack (afternoon)  3:15-5 pm - dry roasted peanuts    Dinner  6 pm - meat, veggie and starch like shrimp and fried rice    Snack (evening)  occassionally ritz crackers or peanuts or doritos or fritos - not as much recently    Beverage(s)  water with sugar-free flavoring, coffee      Exercise   Exercise Type  ADL's    How many days per week to you exercise?  0    How many minutes per day do you exercise?  0    Total minutes per week of exercise  0      Patient Education   Previous Diabetes Education  Yes (please comment)   2010   Nutrition management   Role of diet in the treatment of diabetes and the  relationship between the three main macronutrients and blood glucose level;Food label reading, portion sizes and measuring food.;Reviewed blood glucose goals for pre and post meals and how to evaluate the patients' food intake on their blood glucose level.;Meal timing in regards to the patients' current diabetes medication.    Physical activity and exercise   Role of exercise on diabetes management, blood pressure control and cardiac health.   Patient is having back surgery in Sept. and is hoping to be more active after recovery   Acute complications  Taught treatment of hypoglycemia - the 15 rule.    Chronic complications  Relationship between chronic complications and blood glucose control;Reviewed with patient heart disease, higher risk of, and prevention    Psychosocial adjustment  Worked with patient to identify barriers to care and solutions;Role of stress on diabetes;Identified and addressed patients feelings and concerns about diabetes      Individualized Goals (developed by patient)   Nutrition  Follow meal plan discussed;General guidelines for healthy choices and portions discussed    Medications  take my medication as prescribed    Monitoring   send in my blood glucose log as discussed   starting keeping BG log and bring to next visit   Reducing Risk  treat hypoglycemia with 15 grams of carbs if blood glucose less than 70mg /dL      Outcomes   Expected Outcomes  Demonstrated interest in learning. Expect positive outcomes    Future DMSE  3-4 months    Program Status  Not Completed       Individualized Plan for Diabetes Self-Management Training:   Learning Objective:  Patient will have a greater understanding of diabetes self-management. Patient education plan is to attend individual and/or group sessions per assessed needs and concerns.   Plan:   Follow Diabetes Meal Plan as instructed  Eat 3 meals and 2 snacks, every 3-5 hrs  Limit carbohydrate intake to 40-65 grams  carbohydrate/meal  Limit carbohydrate intake to 15-30 grams carbohydrate/snack  Add lean protein foods to meals/snacks  Monitor glucose levels as instructed by your doctor  Bring glucose log to your next nutrition visit   Expected Outcomes:  Demonstrated interest in learning. Expect positive outcomes  Education material provided: Food label handouts, Yellow Meal Plan Card, My Plate and Snack sheet  If problems or questions, patient to contact team via:  Phone and Email  Future DSME appointment: 3-4 months

## 2018-05-29 NOTE — Pre-Procedure Instructions (Signed)
NICKALIS WARRELL  05/29/2018      [id number] - [place], [place] - [address] [place] [address] Phone: [phone] Fax: [phone]  [id number] - [address], [place] - [address]  [address] Phone: [phone] Fax: [phone]    Your procedure is scheduled on June 05, 2018.  Report to [facility] Admitting at 1200 PM.  Call this number if you have problems the morning of surgery:  [phone]   Remember:  Do not eat or drink after midnight.    Take these medicines the morning of surgery with A SIP OF WATER  Citalopram (celexa) Gabapentin (neurontin)  Follow your surgeon's instructions on when to hold/resume aspirin.  If no instructions were given call the office to determine how they would like to you take aspirin  7 days prior to surgery STOP taking any Indomethacin (indocin),  Aspirin (unless otherwise instructed by your surgeon), Aleve, Naproxen, Ibuprofen, Motrin, Advil, Goody's, BC's, all herbal medications, fish oil, and all vitamins  WHAT DO I DO ABOUT MY DIABETES MEDICATION?  Marland Kitchen Do not take oral diabetes medicines (pills) the morning of surgery-Jardiance, glimepiride (amaryl).  . THE DAY BEFORE SURGERY, take your normal dose (6 units) of Novolog R (regular) insulin at 3:00 PM And 12 units of Tresiba Insulin before bed (1/2 of your normal dose).   . THE MORNING OF SURGERY, take No insulin.  Reviewed and Endorsed by [facility] Patient Education Committee, August 2015    How to Manage Your Diabetes Before and After Surgery  Why is it important to control my blood sugar before and after surgery? . Improving blood sugar levels before and after surgery helps healing and can limit problems. . A way of improving blood sugar control is eating a healthy diet by: o  Eating less sugar and carbohydrates o  Increasing activity/exercise o  Talking with your doctor about reaching your blood sugar  goals . High blood sugars (greater than 180 mg/dL) can raise your risk of infections and slow your recovery, so you will need to focus on controlling your diabetes during the weeks before surgery. . Make sure that the doctor who takes care of your diabetes knows about your planned surgery including the date and location.  How do I manage my blood sugar before surgery? . Check your blood sugar at least 4 times a day, starting 2 days before surgery, to make sure that the level is not too high or low. o Check your blood sugar the morning of your surgery when you wake up and every 2 hours until you get to the Short Stay unit. . If your blood sugar is less than 70 mg/dL, you will need to treat for low blood sugar: o Do not take insulin. o Treat a low blood sugar (less than 70 mg/dL) with  cup of clear juice (cranberry or apple), 4 glucose tablets, OR glucose gel. Recheck blood sugar in 15 minutes after treatment (to make sure it is greater than 70 mg/dL). If your blood sugar is not greater than 70 mg/dL on recheck, call [phone] o  for further instructions. . Report your blood sugar to the short stay nurse when you get to Short Stay.  . If you are admitted to the hospital after surgery: o Your blood sugar will be checked by the staff and you will probably be given insulin after surgery (instead of oral diabetes medicines) to make sure you have good blood sugar  levels. o The goal for blood sugar control after surgery is 80-180 mg/dL.  Contacts, dentures or bridgework may not be worn into surgery.  Leave your suitcase in the car.  After surgery it may be brought to your room.  For patients admitted to the hospital, discharge time will be determined by your treatment team.  Patients discharged the day of surgery will not be allowed to drive home.   [place]- Preparing For Surgery  Before surgery, you can play an important role. Because skin is not sterile, your skin needs to be as free of  germs as possible. You can reduce the number of germs on your skin by washing with CHG (chlorahexidine gluconate) Soap before surgery.  CHG is an antiseptic cleaner which kills germs and bonds with the skin to continue killing germs even after washing.    Oral Hygiene is also important to reduce your risk of infection.  Remember - BRUSH YOUR TEETH THE MORNING OF SURGERY WITH YOUR REGULAR TOOTHPASTE  Please do not use if you have an allergy to CHG or antibacterial soaps. If your skin becomes reddened/irritated stop using the CHG.  Do not shave (including legs and underarms) for at least 48 hours prior to first CHG shower. It is OK to shave your face.  Please follow these instructions carefully.   1. Shower the NIGHT BEFORE SURGERY and the MORNING OF SURGERY with CHG.   2. If you chose to wash your hair, wash your hair first as usual with your normal shampoo.  3. After you shampoo, rinse your hair and body thoroughly to remove the shampoo.  4. Use CHG as you would any other liquid soap. You can apply CHG directly to the skin and wash gently with a scrungie or a clean washcloth.   5. Apply the CHG Soap to your body ONLY FROM THE NECK DOWN.  Do not use on open wounds or open sores. Avoid contact with your eyes, ears, mouth and genitals (private parts). Wash Face and genitals (private parts)  with your normal soap.  6. Wash thoroughly, paying special attention to the area where your surgery will be performed.  7. Thoroughly rinse your body with warm water from the neck down.  8. DO NOT shower/wash with your normal soap after using and rinsing off the CHG Soap.  9. Pat yourself dry with a CLEAN TOWEL.  10. Wear CLEAN PAJAMAS to bed the night before surgery, wear comfortable clothes the morning of surgery  11. Place CLEAN SHEETS on your bed the night of your first shower and DO NOT SLEEP WITH PETS.  Day of Surgery:  Do not apply any deodorants/lotions.  Please wear clean clothes to the  hospital/surgery center.   Remember to brush your teeth WITH YOUR REGULAR TOOTHPASTE.   Do not wear jewelry  Do not wear lotions, powders, or colognes, or deodorant.  Men may shave face and neck.  Do not bring valuables to the hospital.  [facility] is not responsible for any belongings or valuables.  Please read over the following fact sheets that you were given. Pain Booklet, Coughing and Deep Breathing, MRSA Information and Surgical Site Infection Prevention

## 2018-05-30 ENCOUNTER — Encounter (HOSPITAL_COMMUNITY)
Admission: RE | Admit: 2018-05-30 | Discharge: 2018-05-30 | Disposition: A | Discharge status: Home / Self Care | Payer: BLUE CROSS/BLUE SHIELD | Source: Ambulatory Visit | Attending: Orthopedic Surgery | Admitting: Orthopedic Surgery

## 2018-05-30 ENCOUNTER — Encounter (HOSPITAL_COMMUNITY): Payer: Self-pay

## 2018-05-30 ENCOUNTER — Telehealth: Payer: Self-pay | Admitting: Cardiovascular Disease

## 2018-05-30 ENCOUNTER — Other Ambulatory Visit: Payer: Self-pay

## 2018-05-30 DIAGNOSIS — F419 Anxiety disorder, unspecified: Secondary | ICD-10-CM | POA: Insufficient documentation

## 2018-05-30 DIAGNOSIS — Z87891 Personal history of nicotine dependence: Secondary | ICD-10-CM | POA: Diagnosis not present

## 2018-05-30 DIAGNOSIS — I10 Essential (primary) hypertension: Secondary | ICD-10-CM | POA: Diagnosis not present

## 2018-05-30 DIAGNOSIS — Z794 Long term (current) use of insulin: Secondary | ICD-10-CM | POA: Insufficient documentation

## 2018-05-30 DIAGNOSIS — E119 Type 2 diabetes mellitus without complications: Secondary | ICD-10-CM | POA: Insufficient documentation

## 2018-05-30 DIAGNOSIS — Z95810 Presence of automatic (implantable) cardiac defibrillator: Secondary | ICD-10-CM | POA: Insufficient documentation

## 2018-05-30 DIAGNOSIS — F329 Major depressive disorder, single episode, unspecified: Secondary | ICD-10-CM | POA: Diagnosis not present

## 2018-05-30 DIAGNOSIS — Z79899 Other long term (current) drug therapy: Secondary | ICD-10-CM | POA: Diagnosis not present

## 2018-05-30 DIAGNOSIS — Z01812 Encounter for preprocedural laboratory examination: Secondary | ICD-10-CM | POA: Diagnosis not present

## 2018-05-30 DIAGNOSIS — Z7982 Long term (current) use of aspirin: Secondary | ICD-10-CM | POA: Insufficient documentation

## 2018-05-30 HISTORY — DX: Unspecified osteoarthritis, unspecified site: M19.90

## 2018-05-30 LAB — CBC
HCT: 47.1 % (ref 39.0–52.0)
Hemoglobin: 16.2 g/dL (ref 13.0–17.0)
MCH: 31.7 pg (ref 26.0–34.0)
MCHC: 34.4 g/dL (ref 30.0–36.0)
MCV: 92.2 fL (ref 78.0–100.0)
Platelets: 209 10*3/uL (ref 150–400)
RBC: 5.11 MIL/uL (ref 4.22–5.81)
RDW: 12.2 % (ref 11.5–15.5)
WBC: 5 10*3/uL (ref 4.0–10.5)

## 2018-05-30 LAB — BASIC METABOLIC PANEL
Anion gap: 7 (ref 5–15)
BUN: 15 mg/dL (ref 6–20)
CO2: 26 mmol/L (ref 22–32)
Calcium: 8.9 mg/dL (ref 8.9–10.3)
Chloride: 107 mmol/L (ref 98–111)
Creatinine, Ser: 0.93 mg/dL (ref 0.61–1.24)
GFR calc Af Amer: >60 mL/min (ref >60)
GFR calc non Af Amer: >60 mL/min (ref >60)
Glucose, Bld: 148 mg/dL — ABNORMAL HIGH (ref 70–99)
Potassium: 4.3 mmol/L (ref 3.5–5.1)
Sodium: 140 mmol/L (ref 135–145)

## 2018-05-30 LAB — HEMOGLOBIN A1C
Hgb A1c MFr Bld: 9.1 % — ABNORMAL HIGH (ref 4.8–5.6)
Mean Plasma Glucose: 214.47 mg/dL

## 2018-05-30 LAB — SURGICAL PCR SCREEN
MRSA, PCR: NEGATIVE
Staphylococcus aureus: NEGATIVE

## 2018-05-30 NOTE — Progress Notes (Signed)
PCP is [facility] LOV 04/2018  [phone]) They also manage his diabetes. He thinks last A1C was 3 mths ago.  Checks his sugars bid.  Range from 43 - 160 PCP is Dr. Royann Shivers  LOV 11/2017 He states that PCP put him on the asa for preventative medicine.  Will stop it 7 days out. Pacer placed  2009 by Dr. Jenne Campus for bradycardia (had since he was a teen) Was tested for OSA - negative

## 2018-05-30 NOTE — Telephone Encounter (Signed)
Called patient regarding his remote transmission. Patient states that he sent his last remote a few days late and wasn't sure if it was received. I informed patient that his transmission was received on 03/08/18 and his next remote is scheduled for 06/09/18. Patient verbalized understanding.  Patient also states that he's going to have surgery on 9/12. I informed him that the hospital will send a clearance form for his ppm. Again, patient verbalized understanding.

## 2018-05-30 NOTE — Telephone Encounter (Signed)
Patient of Dr C walked in to clinic. He is requesting a follow up call/notification that his remote/online pacemaker check when through.   Will route to device clinic to f/up with patient

## 2018-06-02 NOTE — H&P (Addendum)
Patient ID: Theodore [name] MRN: [id number] DOB/AGE: 1966/06/27 52 y.o.  Admit date: (Not on file)  Admission Diagnoses:  Lumbar spinal stenosis  HPI: The patient is here for his H & P.  He is scheduled for a lumbar decompression @ L4-S1 on 06/02/18 at [facility] by Dr. Shon [name]. Pt has a pacemaker.  Pt has DM with an A1c of 9.1.  He and his PCp continue to work on decreasing.   Past Medical History: Past Medical History:  Diagnosis Date  . Anxiety   . Arthritis   . Bradycardia   . Depression   . Diabetes mellitus    dx 2010  . Pacemaker    Permanent pacemaker placement by Dr. Molly Maduro [name]--10/21/2007--a Medtronic Adapta--serial number #[id number]  . Syncope 09/2007   2D Echo--History of frequent episodes of syncope beginning at 52 years of age--usually associated with some type of vagal issue but he does completely lose consciousness    Surgical History: Past Surgical History:  Procedure Laterality Date  . CARDIAC CATHETERIZATION Right 10/20/2007  . HAND SURGERY    . PACEMAKER INSERTION  10/21/2007   Dr Theodore [name]  . SHOULDER SURGERY  1999  . TONSILLECTOMY      Family History: Family History  Problem Relation Age of Onset  . Diabetes Sister   . Diabetes Mother   . Coronary artery disease Mother 69's  . Hypertension Mother     Social History: Social History   Socioeconomic History  . Marital status: Married    Spouse name: Not on file  . Number of children: Not on file  . Years of education: Not on file  . Highest education level: Not on file  Occupational History  . Not on file  Social Needs  . Financial resource strain: Not on file  . Food insecurity:    Worry: Not on file    Inability: Not on file  . Transportation needs:    Medical: Not on file    Non-medical: Not on file  Tobacco Use  . Smoking status: Former Smoker    Last attempt to quit: 10/17/2007    Years since quitting: 10.6  . Smokeless tobacco: Former Engineer, water and  Sexual Activity  . Alcohol use: No  . Drug use: No  . Sexual activity: Not on file  Lifestyle  . Physical activity:    Days per week: Not on file    Minutes per session: Not on file  . Stress: Not on file  Relationships  . Social connections:    Talks on phone: Not on file    Gets together: Not on file    Attends religious service: Not on file    Active member of club or organization: Not on file    Attends meetings of clubs or organizations: Not on file    Relationship status: Not on file  . Intimate partner violence:    Fear of current or ex partner: Not on file    Emotionally abused: Not on file    Physically abused: Not on file    Forced sexual activity: Not on file  Other Topics Concern  . Not on file  Social History Narrative  . Not on file    Allergies: Cephalexin  Medications: I have reviewed the patient's current medications.  Vital Signs: No data found.  Radiology: No results found.  Labs: No results for input(s): WBC, RBC, HCT, PLT in the last 72 hours. No results for input(s): NA, K,  CL, CO2, BUN, CREATININE, GLUCOSE, CALCIUM in the last 72 hours. No results for input(s): LABPT, INR in the last 72 hours.  Review of Systems: ROS  Physical Exam: There is no height or weight on file to calculate BMI.  Physical Exam  Constitutional: He is oriented to person, place, and time. He appears well-developed and well-nourished.  HENT:  Head: Normocephalic.  Cardiovascular: Normal rate and regular rhythm.  Respiratory: Effort normal and breath sounds normal.  GI: Soft. Bowel sounds are normal.  Neurological: He is alert and oriented to person, place, and time.  Skin: Skin is warm and dry.  Psychiatric: He has a normal mood and affect. His behavior is normal. Judgment and thought content normal.   Gait pattern: Patient ambulates in a forward flexed posture due to severe back buttock and bilateral leg pain.  Assistive devices: No assistive devices for  ambulation  Neuro: 5 out of 5 strength in the lower extremity.  Positive dysesthesias in both lower extremities.  Negative nerve root tension signs in the lower extremity.  Negative Babinski test, no clonus.  Musculoskeletal: Moderate to severe back pain with extension and rotation.  Relief with forward flexion of the back.  Severe buttock and bilateral leg pain which she describes as a vice-like pain.  No significant hip, knee, ankle pain with isolated joint range of motion  CT myelogram completed on 05/02/18: Moderate to severe spinal stenosis at L4-5.  There is a slight 1 mm anterior slip.  There is foraminal and lateral recess stenosis which has progressed since 2011.  Moderate spinal stenosis at L5-S1 there is foraminal stenosis most prominent on the left side.   Assessment and Plan:  Risks and benefits of surgery were discussed with the patient. These include: Infection, bleeding, death, stroke, paralysis, ongoing or worse pain, need for additional surgery, leak of spinal fluid, adjacent segment degeneration requiring additional surgery, post-operative hematoma formation that can result in neurological compromise and the need for urgent/emergent re-operation. Loss in bowel and bladder control. Injury to major vessels that could result in the need for urgent abdominal surgery to stop bleeding. Risk of deep venous thrombosis (DVT) and the need for additional treatment. Recurrent disc herniation resulting in the need for revision surgery, which could include fusion surgery (utilizing instrumentation such as pedicle screws and intervertebral cages). Additional risk: If instrumentation is used to address spinal stenosis there is a risk of migration, or breakage of that hardware that could require additional surgery.  Goal of surgery: Reduce (not eliminate) pain, and improve quality of life.  Theodore [name], PAC for Theodore Lick, MD [facility] [phone]  Patient presents today for 2  level lumbar decompression for spinal stenosis.  There is been no significant change since his last evaluation in my office on 06/02/2018.  Patient still continues to have bilateral dysesthesias consistent with neurogenic claudication secondary to spinal stenosis left side more prominent than the right.  Despite conservative care he continues to deteriorate and we have elected to move forward with a lumbar decompression.  All appropriate risks benefits and alternatives were discussed with the patient and consent was obtained.  All of his and his wife's questions were encouraged.

## 2018-06-02 NOTE — Progress Notes (Signed)
Anesthesia Chart Review:  Case:  [id number] Date/Time:  06/05/18 1345   Procedure:  Lumbar decompression L4-S1 (N/A ) - 3 hrs   Anesthesia type:  General   Pre-op diagnosis:  Lumbar spinal stenosis with neurogenic claudication   Location:  [facility] ROOM 04 / [facility]   Surgeon:  [name], MD      DISCUSSION: Patient is a 52 year old male scheduled for the above procedure. 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  He reported being instructed to hold ASA for 7 days prior to surgery.  Last PPM transmission 03/08/18 (next scheduled for 06/09/18). By 03/08/18 device check, he is not pacemaker dependent, battery status was good. Occasional episodes of very brief atrial tachycardia. No significant events.  PPM perioperative prescription form is still pending from [facility].  DM2 is not optimally controlled as evidence by preoperative A1c of 9.1. This result was called to Jacki Cones at Dr. Shon Baton' office. As long as patient does not have a significantly elevated glucose on the day of surgery then I would anticipate that he can proceed from an anesthesia standpoint, but Dr. Shon Baton may want him to get his diabetes better controlled first. Defer timing of surgery to surgeon.   VS: BP (!) 130/95   Temp 36.7 C (Oral)   Resp 18   Ht 5\' 9"  (1.753 m)   SpO2 99%   BMI 25.90 kg/m   PROVIDERS: [facility] is PCP office. Royann Shivers, Rachelle Hora, MD is cardiologist. Last visit 12/12/17. Normal PPM function. No further PAF episodes. CHADSVasc 1 (DM). Recommended ASA, but no anticoagulation or antiarrhythmics at that time.    LABS: Preoperative labs noted. A1c 9.1. Reports home CBGs range from 43-160.  (all labs  ordered are listed, but only abnormal results are displayed)  Labs Reviewed  BASIC METABOLIC PANEL - Abnormal; Notable for the following components:      Result Value   Glucose, Bld 148 (*)    All other components within normal limits  HEMOGLOBIN A1C - Abnormal; Notable for the following components:   Hgb A1c MFr Bld 9.1 (*)    All other components within normal limits  SURGICAL PCR SCREEN  CBC    SLEEP STUDY:  Split Night Study 10/20/17: IMPRESSIONS - Occasional obstructive sleep apnea events occurred during this  study, within normal limits (AHI = 3.7/h). - No significant central sleep apnea occurred during this study  (CAI = 0.0/h). - Mild oxygen desaturation was noted during this study (Min O2 =  87.00%, Mean 93.4%). - The patient snored with loud snoring volume. - No cardiac abnormalities were noted during this study. - Clinically significant periodic limb movements did not occur  during sleep. No significant associated arousals. DIAGNOSIS - Primary snoring RECOMMENDATIONS - Positional therapy avoiding supine position during sleep. - Be careful with alcohol, sedatives and other CNS depressants  that may worsen sleep apnea and disrupt normal sleep  architecture. - Sleep hygiene should be reviewed to assess factors that may  improve sleep quality. - Weight management and regular exercise should be initiated or  continued if appropriate.   IMAGES: CT Lumbar spine/Myelogram 05/02/18: IMPRESSION: LUMBAR MYELOGRAM IMPRESSION: - Moderate to severe stenosis at L4-5, with LEFT greater than RIGHT L5 nerve root cut off. - Mild  stenosis at L5-S1. - Dynamic instability at L4-5 and L5-S1, up to 3 mm anterolisthesis in flexion standing at the L4-5 level. CT LUMBAR MYELOGRAM IMPRESSION: - Moderate to severe stenosis at L4-5 secondary to annular bulge/central protrusion, posterior element hypertrophy, and 1 mm of slip. LEFT greater than RIGHT L5 nerve root impingement.  LEFT-sided foraminal narrowing affecting the L4 nerve root also noted. Progression of disease from 2011. - Mild stenosis at L5-S1 secondary to posterior element hypertrophy, 1 mm slip, and annular bulge. LEFT greater than RIGHT S1 neural impingement, with additional note of LEFT-sided foraminal narrowing likely affecting the L5 nerve root. - Aortic Atherosclerosis (ICD10-I70.0).   EKG: 12/12/17: Demonstrates "both atrial paced and sinus rhythm with native AV conduction otherwise normal tracing. QTC 415 ms."   CV: Cardiac cath 10/20/07: Impression:  Normal coronaries. Normal left ventricular systolic function.  Echo 10/20/07: SUMMARY - Overall left ventricular systolic function was normal. There was    no diagnostic evidence of left ventricular regional wall    motion abnormalities.   Past Medical History:  Diagnosis Date  . Anxiety   . Arthritis   . Bradycardia   . Depression   . Diabetes mellitus    dx 2010  . Pacemaker    Permanent pacemaker placement by Dr. Molly Maduro McQueen--10/21/2007--a Medtronic Adapta--serial number #[id number]  . Syncope 09/2007   2D Echo--History of frequent episodes of syncope beginning at 52 years of age--usually associated with some type of vagal issue but he does completely lose consciousness    Past Surgical History:  Procedure Laterality Date  . CARDIAC CATHETERIZATION Right 10/20/2007  . HAND SURGERY    . PACEMAKER INSERTION  10/21/2007   Dr Jenne Campus  . SHOULDER SURGERY  1999  . TONSILLECTOMY      MEDICATIONS: . aspirin EC 81 MG tablet  . citalopram (CELEXA) 20 MG tablet  . fenofibrate 160 MG tablet  . gabapentin (NEURONTIN) 300 MG capsule  . glimepiride (AMARYL) 4 MG tablet  . indomethacin (INDOCIN) 25 MG capsule  . insulin degludec (TRESIBA FLEXTOUCH) 100 UNIT/ML SOPN FlexTouch Pen  . JARDIANCE 10 MG TABS tablet  . lisinopril (PRINIVIL,ZESTRIL) 5 MG tablet  . NOVOLIN R 100 UNIT/ML injection   No current  facility-administered medications for this encounter.     Velna Ochs [facility] Short Stay Center/Anesthesiology Phone [phone] 06/02/2018 11:52 AM

## 2018-06-05 ENCOUNTER — Encounter (HOSPITAL_COMMUNITY)
Admission: RE | Disposition: A | Discharge status: Home / Self Care | Payer: Self-pay | Source: Ambulatory Visit | Attending: Orthopedic Surgery

## 2018-06-05 ENCOUNTER — Other Ambulatory Visit: Payer: Self-pay

## 2018-06-05 ENCOUNTER — Ambulatory Visit (HOSPITAL_COMMUNITY): Payer: BLUE CROSS/BLUE SHIELD | Admitting: Physician Assistant

## 2018-06-05 ENCOUNTER — Encounter (HOSPITAL_COMMUNITY): Payer: Self-pay | Admitting: Anesthesiology

## 2018-06-05 ENCOUNTER — Ambulatory Visit (HOSPITAL_COMMUNITY): Payer: BLUE CROSS/BLUE SHIELD | Admitting: Anesthesiology

## 2018-06-05 ENCOUNTER — Ambulatory Visit (HOSPITAL_COMMUNITY): Payer: BLUE CROSS/BLUE SHIELD

## 2018-06-05 ENCOUNTER — Observation Stay (HOSPITAL_COMMUNITY)
Admission: RE | Admit: 2018-06-05 | Discharge: 2018-06-07 | Disposition: A | Discharge status: Home / Self Care | Payer: BLUE CROSS/BLUE SHIELD | Source: Ambulatory Visit | Attending: Orthopedic Surgery | Admitting: Orthopedic Surgery

## 2018-06-05 DIAGNOSIS — R55 Syncope and collapse: Secondary | ICD-10-CM | POA: Diagnosis present

## 2018-06-05 DIAGNOSIS — F418 Other specified anxiety disorders: Secondary | ICD-10-CM | POA: Diagnosis not present

## 2018-06-05 DIAGNOSIS — I480 Paroxysmal atrial fibrillation: Secondary | ICD-10-CM | POA: Diagnosis present

## 2018-06-05 DIAGNOSIS — E1165 Type 2 diabetes mellitus with hyperglycemia: Secondary | ICD-10-CM | POA: Diagnosis not present

## 2018-06-05 DIAGNOSIS — Z87891 Personal history of nicotine dependence: Secondary | ICD-10-CM | POA: Diagnosis not present

## 2018-06-05 DIAGNOSIS — I10 Essential (primary) hypertension: Secondary | ICD-10-CM | POA: Insufficient documentation

## 2018-06-05 DIAGNOSIS — M2578 Osteophyte, vertebrae: Secondary | ICD-10-CM | POA: Diagnosis not present

## 2018-06-05 DIAGNOSIS — G9741 Accidental puncture or laceration of dura during a procedure: Secondary | ICD-10-CM | POA: Diagnosis not present

## 2018-06-05 DIAGNOSIS — M4807 Spinal stenosis, lumbosacral region: Secondary | ICD-10-CM | POA: Diagnosis not present

## 2018-06-05 DIAGNOSIS — Z950 Presence of cardiac pacemaker: Secondary | ICD-10-CM | POA: Diagnosis not present

## 2018-06-05 DIAGNOSIS — R001 Bradycardia, unspecified: Secondary | ICD-10-CM | POA: Diagnosis present

## 2018-06-05 DIAGNOSIS — Y838 Other surgical procedures as the cause of abnormal reaction of the patient, or of later complication, without mention of misadventure at the time of the procedure: Secondary | ICD-10-CM | POA: Diagnosis not present

## 2018-06-05 DIAGNOSIS — Z794 Long term (current) use of insulin: Secondary | ICD-10-CM | POA: Diagnosis not present

## 2018-06-05 DIAGNOSIS — Z9889 Other specified postprocedural states: Secondary | ICD-10-CM

## 2018-06-05 DIAGNOSIS — Z419 Encounter for procedure for purposes other than remedying health state, unspecified: Secondary | ICD-10-CM

## 2018-06-05 DIAGNOSIS — M48062 Spinal stenosis, lumbar region with neurogenic claudication: Secondary | ICD-10-CM | POA: Diagnosis not present

## 2018-06-05 DIAGNOSIS — G903 Multi-system degeneration of the autonomic nervous system: Secondary | ICD-10-CM | POA: Diagnosis present

## 2018-06-05 HISTORY — PX: LUMBAR LAMINECTOMY/DECOMPRESSION MICRODISCECTOMY: SHX5026

## 2018-06-05 LAB — GLUCOSE, CAPILLARY
Glucose-Capillary: 77 mg/dL (ref 70–99)
Glucose-Capillary: 64 mg/dL — ABNORMAL LOW (ref 70–99)
Glucose-Capillary: 130 mg/dL — ABNORMAL HIGH (ref 70–99)
Glucose-Capillary: 89 mg/dL (ref 70–99)
Glucose-Capillary: 106 mg/dL — ABNORMAL HIGH (ref 70–99)
Glucose-Capillary: 273 mg/dL — ABNORMAL HIGH (ref 70–99)

## 2018-06-05 SURGERY — LUMBAR LAMINECTOMY/DECOMPRESSION MICRODISCECTOMY 2 LEVELS
Anesthesia: General

## 2018-06-05 MED ORDER — CITALOPRAM HYDROBROMIDE 20 MG PO TABS
20.0000 mg | ORAL_TABLET | Freq: Every day | ORAL | Status: DC
  Administered 2018-06-06 – 2018-06-07 (×2): 20 mg via ORAL
  Filled 2018-06-05 (×2): qty 1

## 2018-06-05 MED ORDER — OXYCODONE HCL 5 MG PO TABS: 5.0000 mg | ORAL_TABLET | Freq: Once | ORAL | Status: DC | PRN

## 2018-06-05 MED ORDER — EPHEDRINE 5 MG/ML INJ
INTRAVENOUS | Status: AC
  Filled 2018-06-05: qty 10

## 2018-06-05 MED ORDER — DEXTROSE 50 % IV SOLN
12.5000 mL | Freq: Once | INTRAVENOUS | Status: AC
  Administered 2018-06-05: 12.5 mL via INTRAVENOUS
  Filled 2018-06-05: qty 50

## 2018-06-05 MED ORDER — ROCURONIUM BROMIDE 50 MG/5ML IV SOSY
PREFILLED_SYRINGE | INTRAVENOUS | Status: AC
  Filled 2018-06-05: qty 5

## 2018-06-05 MED ORDER — HYDROMORPHONE HCL 1 MG/ML IJ SOLN
INTRAMUSCULAR | Status: AC
  Filled 2018-06-05: qty 1

## 2018-06-05 MED ORDER — MENTHOL 3 MG MT LOZG: 1.0000 | LOZENGE | OROMUCOSAL | Status: DC | PRN

## 2018-06-05 MED ORDER — SODIUM CHLORIDE 0.9% FLUSH
3.0000 mL | Freq: Two times a day (BID) | INTRAVENOUS | Status: DC
  Administered 2018-06-05 – 2018-06-07 (×3): 3 mL via INTRAVENOUS

## 2018-06-05 MED ORDER — CANAGLIFLOZIN 100 MG PO TABS
100.0000 mg | ORAL_TABLET | Freq: Every day | ORAL | Status: DC
  Administered 2018-06-06 – 2018-06-07 (×2): 100 mg via ORAL
  Filled 2018-06-05 (×3): qty 1

## 2018-06-05 MED ORDER — HYDROMORPHONE HCL 1 MG/ML IJ SOLN
0.2500 mg | INTRAMUSCULAR | Status: DC | PRN
  Administered 2018-06-05: 0.25 mg via INTRAVENOUS

## 2018-06-05 MED ORDER — ONDANSETRON HCL 4 MG/2ML IJ SOLN: 4.0000 mg | Freq: Four times a day (QID) | INTRAMUSCULAR | Status: DC | PRN

## 2018-06-05 MED ORDER — ROCURONIUM BROMIDE 10 MG/ML (PF) SYRINGE
PREFILLED_SYRINGE | INTRAVENOUS | Status: DC | PRN
  Administered 2018-06-05: 50 mg via INTRAVENOUS

## 2018-06-05 MED ORDER — LIDOCAINE 2% (20 MG/ML) 5 ML SYRINGE
INTRAMUSCULAR | Status: DC | PRN
  Administered 2018-06-05: 80 mg via INTRAVENOUS

## 2018-06-05 MED ORDER — DEXTROSE 50 % IV SOLN: INTRAVENOUS | Status: DC | PRN

## 2018-06-05 MED ORDER — INSULIN ASPART 100 UNIT/ML ~~LOC~~ SOLN
0.0000 [IU] | Freq: Three times a day (TID) | SUBCUTANEOUS | Status: DC
  Administered 2018-06-06: 2 [IU] via SUBCUTANEOUS
  Administered 2018-06-06: 3 [IU] via SUBCUTANEOUS
  Administered 2018-06-06: 2 [IU] via SUBCUTANEOUS
  Administered 2018-06-07: 3 [IU] via SUBCUTANEOUS

## 2018-06-05 MED ORDER — LIDOCAINE 2% (20 MG/ML) 5 ML SYRINGE
INTRAMUSCULAR | Status: AC
  Filled 2018-06-05: qty 5

## 2018-06-05 MED ORDER — PROPOFOL 10 MG/ML IV BOLUS
INTRAVENOUS | Status: AC
  Filled 2018-06-05: qty 20

## 2018-06-05 MED ORDER — POLYETHYLENE GLYCOL 3350 17 G PO PACK: 17.0000 g | PACK | Freq: Every day | ORAL | Status: DC | PRN

## 2018-06-05 MED ORDER — METHOCARBAMOL 500 MG PO TABS: 500.0000 mg | ORAL_TABLET | Freq: Three times a day (TID) | ORAL | 0 refills | Status: DC

## 2018-06-05 MED ORDER — CLINDAMYCIN PHOSPHATE 900 MG/50ML IV SOLN
900.0000 mg | Freq: Two times a day (BID) | INTRAVENOUS | Status: DC
  Administered 2018-06-05 – 2018-06-07 (×4): 900 mg via INTRAVENOUS
  Filled 2018-06-05 (×4): qty 50

## 2018-06-05 MED ORDER — GLIMEPIRIDE 2 MG PO TABS
4.0000 mg | ORAL_TABLET | Freq: Every day | ORAL | Status: DC
  Administered 2018-06-06 – 2018-06-07 (×2): 4 mg via ORAL
  Filled 2018-06-05 (×2): qty 2

## 2018-06-05 MED ORDER — FENTANYL CITRATE (PF) 100 MCG/2ML IJ SOLN
INTRAMUSCULAR | Status: DC | PRN
  Administered 2018-06-05: 150 ug via INTRAVENOUS
  Administered 2018-06-05 (×2): 50 ug via INTRAVENOUS

## 2018-06-05 MED ORDER — SODIUM CHLORIDE 0.9% FLUSH: 3.0000 mL | INTRAVENOUS | Status: DC | PRN

## 2018-06-05 MED ORDER — METHOCARBAMOL 500 MG PO TABS
500.0000 mg | ORAL_TABLET | Freq: Four times a day (QID) | ORAL | Status: DC | PRN
  Administered 2018-06-05 – 2018-06-07 (×5): 500 mg via ORAL
  Filled 2018-06-05 (×5): qty 1

## 2018-06-05 MED ORDER — CLINDAMYCIN PHOSPHATE 900 MG/50ML IV SOLN
900.0000 mg | INTRAVENOUS | Status: AC
  Administered 2018-06-05: 900 mg via INTRAVENOUS
  Filled 2018-06-05: qty 50

## 2018-06-05 MED ORDER — ACETAMINOPHEN 650 MG RE SUPP: 650.0000 mg | RECTAL | Status: DC | PRN

## 2018-06-05 MED ORDER — DOCUSATE SODIUM 100 MG PO CAPS
100.0000 mg | ORAL_CAPSULE | Freq: Two times a day (BID) | ORAL | Status: DC
  Administered 2018-06-05 – 2018-06-07 (×4): 100 mg via ORAL
  Filled 2018-06-05 (×4): qty 1

## 2018-06-05 MED ORDER — GABAPENTIN 300 MG PO CAPS
300.0000 mg | ORAL_CAPSULE | ORAL | Status: DC
  Administered 2018-06-06 – 2018-06-07 (×2): 300 mg via ORAL
  Filled 2018-06-05 (×2): qty 1

## 2018-06-05 MED ORDER — SODIUM CHLORIDE 0.9 % IV SOLN: 250.0000 mL | INTRAVENOUS | Status: DC

## 2018-06-05 MED ORDER — PHENYLEPHRINE 40 MCG/ML (10ML) SYRINGE FOR IV PUSH (FOR BLOOD PRESSURE SUPPORT)
PREFILLED_SYRINGE | INTRAVENOUS | Status: DC | PRN
  Administered 2018-06-05 (×5): 80 ug via INTRAVENOUS

## 2018-06-05 MED ORDER — SUGAMMADEX SODIUM 200 MG/2ML IV SOLN
INTRAVENOUS | Status: DC | PRN
  Administered 2018-06-05: 200 mg via INTRAVENOUS

## 2018-06-05 MED ORDER — DEXAMETHASONE SODIUM PHOSPHATE 10 MG/ML IJ SOLN
INTRAMUSCULAR | Status: AC
  Filled 2018-06-05: qty 1

## 2018-06-05 MED ORDER — BUPIVACAINE-EPINEPHRINE (PF) 0.25% -1:200000 IJ SOLN
INTRAMUSCULAR | Status: AC
  Filled 2018-06-05: qty 30

## 2018-06-05 MED ORDER — OXYCODONE HCL 5 MG/5ML PO SOLN: 5.0000 mg | Freq: Once | ORAL | Status: DC | PRN

## 2018-06-05 MED ORDER — DIPHENHYDRAMINE HCL 50 MG/ML IJ SOLN
12.5000 mg | INTRAMUSCULAR | Status: DC | PRN
  Administered 2018-06-05: 12.5 mg via INTRAVENOUS

## 2018-06-05 MED ORDER — THROMBIN 20000 UNITS EX SOLR
CUTANEOUS | Status: DC | PRN
  Administered 2018-06-05: 14:00:00 via TOPICAL

## 2018-06-05 MED ORDER — MORPHINE SULFATE (PF) 2 MG/ML IV SOLN: 1.0000 mg | INTRAVENOUS | Status: DC | PRN

## 2018-06-05 MED ORDER — SODIUM CHLORIDE 0.9 % IV SOLN
INTRAVENOUS | Status: DC | PRN
  Administered 2018-06-05: 25 ug/min via INTRAVENOUS

## 2018-06-05 MED ORDER — DEXTROSE 50 % IV SOLN
INTRAVENOUS | Status: DC | PRN
  Administered 2018-06-05: 12.5 mL via INTRAVENOUS

## 2018-06-05 MED ORDER — VANCOMYCIN HCL IN DEXTROSE 1-5 GM/200ML-% IV SOLN
1000.0000 mg | INTRAVENOUS | Status: AC
  Administered 2018-06-05: 1000 mg via INTRAVENOUS
  Filled 2018-06-05 (×2): qty 200

## 2018-06-05 MED ORDER — MIDAZOLAM HCL 2 MG/2ML IJ SOLN
INTRAMUSCULAR | Status: AC
  Filled 2018-06-05: qty 2

## 2018-06-05 MED ORDER — BUPIVACAINE-EPINEPHRINE (PF) 0.25% -1:200000 IJ SOLN
INTRAMUSCULAR | Status: DC | PRN
  Administered 2018-06-05: 10 mL via PERINEURAL

## 2018-06-05 MED ORDER — LACTATED RINGERS IV SOLN
INTRAVENOUS | Status: DC
  Administered 2018-06-05 (×2): via INTRAVENOUS

## 2018-06-05 MED ORDER — LACTATED RINGERS IV SOLN: INTRAVENOUS | Status: DC

## 2018-06-05 MED ORDER — LISINOPRIL 10 MG PO TABS
5.0000 mg | ORAL_TABLET | Freq: Every day | ORAL | Status: DC
  Filled 2018-06-05: qty 1

## 2018-06-05 MED ORDER — INSULIN ASPART 100 UNIT/ML ~~LOC~~ SOLN
0.0000 [IU] | Freq: Every day | SUBCUTANEOUS | Status: DC
  Administered 2018-06-05: 3 [IU] via SUBCUTANEOUS

## 2018-06-05 MED ORDER — EPHEDRINE SULFATE-NACL 50-0.9 MG/10ML-% IV SOSY
PREFILLED_SYRINGE | INTRAVENOUS | Status: DC | PRN
  Administered 2018-06-05: 10 mg via INTRAVENOUS

## 2018-06-05 MED ORDER — MIDAZOLAM HCL 5 MG/5ML IJ SOLN
INTRAMUSCULAR | Status: DC | PRN
  Administered 2018-06-05: 2 mg via INTRAVENOUS

## 2018-06-05 MED ORDER — ONDANSETRON HCL 4 MG/2ML IJ SOLN
INTRAMUSCULAR | Status: DC | PRN
  Administered 2018-06-05: 4 mg via INTRAVENOUS

## 2018-06-05 MED ORDER — THROMBIN (RECOMBINANT) 20000 UNITS EX SOLR
CUTANEOUS | Status: AC
  Filled 2018-06-05: qty 20000

## 2018-06-05 MED ORDER — ACETAMINOPHEN 10 MG/ML IV SOLN
1000.0000 mg | INTRAVENOUS | Status: AC
  Administered 2018-06-05: 1000 mg via INTRAVENOUS
  Filled 2018-06-05 (×2): qty 100

## 2018-06-05 MED ORDER — MAGNESIUM CITRATE PO SOLN: 1.0000 | Freq: Once | ORAL | Status: DC | PRN

## 2018-06-05 MED ORDER — PROMETHAZINE HCL 25 MG/ML IJ SOLN: 6.2500 mg | INTRAMUSCULAR | Status: DC | PRN

## 2018-06-05 MED ORDER — OXYCODONE-ACETAMINOPHEN 10-325 MG PO TABS: 1.0000 | ORAL_TABLET | ORAL | 0 refills | Status: AC | PRN

## 2018-06-05 MED ORDER — INSULIN ASPART 100 UNIT/ML ~~LOC~~ SOLN: 0.0000 [IU] | SUBCUTANEOUS | Status: DC

## 2018-06-05 MED ORDER — OXYCODONE HCL 5 MG PO TABS: 5.0000 mg | ORAL_TABLET | ORAL | Status: DC | PRN

## 2018-06-05 MED ORDER — ONDANSETRON 4 MG PO TBDP: 4.0000 mg | ORAL_TABLET | Freq: Three times a day (TID) | ORAL | 0 refills | Status: DC | PRN

## 2018-06-05 MED ORDER — PHENYLEPHRINE 40 MCG/ML (10ML) SYRINGE FOR IV PUSH (FOR BLOOD PRESSURE SUPPORT)
PREFILLED_SYRINGE | INTRAVENOUS | Status: AC
  Filled 2018-06-05: qty 10

## 2018-06-05 MED ORDER — ONDANSETRON HCL 4 MG/2ML IJ SOLN
INTRAMUSCULAR | Status: AC
  Filled 2018-06-05: qty 2

## 2018-06-05 MED ORDER — METHOCARBAMOL 1000 MG/10ML IJ SOLN
500.0000 mg | Freq: Four times a day (QID) | INTRAVENOUS | Status: DC | PRN
  Filled 2018-06-05: qty 5

## 2018-06-05 MED ORDER — DIPHENHYDRAMINE HCL 50 MG/ML IJ SOLN: 12.5000 mg | Freq: Once | INTRAMUSCULAR | Status: DC

## 2018-06-05 MED ORDER — ACETAMINOPHEN 325 MG PO TABS: 650.0000 mg | ORAL_TABLET | ORAL | Status: DC | PRN

## 2018-06-05 MED ORDER — DIPHENHYDRAMINE HCL 50 MG/ML IJ SOLN
INTRAMUSCULAR | Status: AC
  Administered 2018-06-05: 12.5 mg via INTRAVENOUS
  Filled 2018-06-05: qty 1

## 2018-06-05 MED ORDER — 0.9 % SODIUM CHLORIDE (POUR BTL) OPTIME
TOPICAL | Status: DC | PRN
  Administered 2018-06-05: 1000 mL

## 2018-06-05 MED ORDER — PROPOFOL 10 MG/ML IV BOLUS
INTRAVENOUS | Status: DC | PRN
  Administered 2018-06-05: 200 mg via INTRAVENOUS

## 2018-06-05 MED ORDER — OXYCODONE HCL 5 MG PO TABS
10.0000 mg | ORAL_TABLET | ORAL | Status: DC | PRN
  Administered 2018-06-05 – 2018-06-07 (×10): 10 mg via ORAL
  Filled 2018-06-05 (×10): qty 2

## 2018-06-05 MED ORDER — FENTANYL CITRATE (PF) 250 MCG/5ML IJ SOLN
INTRAMUSCULAR | Status: AC
  Filled 2018-06-05: qty 5

## 2018-06-05 MED ORDER — FENOFIBRATE 160 MG PO TABS
160.0000 mg | ORAL_TABLET | Freq: Every day | ORAL | Status: DC
  Administered 2018-06-05 – 2018-06-07 (×3): 160 mg via ORAL
  Filled 2018-06-05 (×3): qty 1

## 2018-06-05 MED ORDER — DEXTROSE 50 % IV SOLN
INTRAVENOUS | Status: AC
  Administered 2018-06-05: 12.5 mL via INTRAVENOUS
  Filled 2018-06-05: qty 50

## 2018-06-05 MED ORDER — ONDANSETRON HCL 4 MG PO TABS: 4.0000 mg | ORAL_TABLET | Freq: Four times a day (QID) | ORAL | Status: DC | PRN

## 2018-06-05 MED ORDER — PHENOL 1.4 % MT LIQD: 1.0000 | OROMUCOSAL | Status: DC | PRN

## 2018-06-05 SURGICAL SUPPLY — 64 items
BUR EGG ELITE 4.0 (BURR) IMPLANT
BUR MATCHSTICK NEURO 3.0 LAGG (BURR) IMPLANT
CLSR STERI-STRIP ANTIMIC 1/2X4 (GAUZE/BANDAGES/DRESSINGS) ×2 IMPLANT
CORD BI POLAR (MISCELLANEOUS) ×2 IMPLANT
DRAIN CHANNEL 15F RND FF W/TCR (WOUND CARE) IMPLANT
DRAPE MICROSCOPE LEICA 46X105 (MISCELLANEOUS) IMPLANT
DRAPE POUCH INSTRU U-SHP 10X18 (DRAPES) ×2 IMPLANT
DRAPE SURG 17X11 SM STRL (DRAPES) ×2 IMPLANT
DRAPE U-SHAPE 47X51 STRL (DRAPES) ×2 IMPLANT
DRSG OPSITE POSTOP 4X6 (GAUZE/BANDAGES/DRESSINGS) ×2 IMPLANT
DRSG OPSITE POSTOP 4X8 (GAUZE/BANDAGES/DRESSINGS) IMPLANT
DURAPREP 26ML APPLICATOR (WOUND CARE) ×2 IMPLANT
ELECT BLADE 4.0 EZ CLEAN MEGAD (MISCELLANEOUS)
ELECT CAUTERY BLADE 6.4 (BLADE) ×2 IMPLANT
ELECT PENCIL ROCKER SW 15FT (MISCELLANEOUS) ×2 IMPLANT
ELECT REM PT RETURN 9FT ADLT (ELECTROSURGICAL) ×2
ELECTRODE BLDE 4.0 EZ CLN MEGD (MISCELLANEOUS) IMPLANT
ELECTRODE REM PT RTRN 9FT ADLT (ELECTROSURGICAL) ×1 IMPLANT
EVACUATOR SILICONE 100CC (DRAIN) IMPLANT
FLOSEAL 10ML (HEMOSTASIS) IMPLANT
GLOVE BIO SURGEON STRL SZ 6.5 (GLOVE) ×2 IMPLANT
GLOVE BIOGEL PI IND STRL 6.5 (GLOVE) ×1 IMPLANT
GLOVE BIOGEL PI IND STRL 8.5 (GLOVE) ×1 IMPLANT
GLOVE BIOGEL PI INDICATOR 6.5 (GLOVE) ×1
GLOVE BIOGEL PI INDICATOR 8.5 (GLOVE) ×1
GLOVE SS BIOGEL STRL SZ 8.5 (GLOVE) ×1 IMPLANT
GLOVE SUPERSENSE BIOGEL SZ 8.5 (GLOVE) ×1
GOWN STRL REUS W/ TWL LRG LVL3 (GOWN DISPOSABLE) ×1 IMPLANT
GOWN STRL REUS W/TWL 2XL LVL3 (GOWN DISPOSABLE) ×4 IMPLANT
GOWN STRL REUS W/TWL LRG LVL3 (GOWN DISPOSABLE) ×1
GRAFT DURAGEN MATRIX 1WX1L (Tissue) ×2 IMPLANT
KIT BASIN OR (CUSTOM PROCEDURE TRAY) ×2 IMPLANT
NEEDLE 22X1 1/2 (OR ONLY) (NEEDLE) ×2 IMPLANT
NEEDLE SPNL 18GX3.5 QUINCKE PK (NEEDLE) ×4 IMPLANT
NS IRRIG 1000ML POUR BTL (IV SOLUTION) ×2 IMPLANT
PACK LAMINECTOMY ORTHO (CUSTOM PROCEDURE TRAY) ×2 IMPLANT
PACK UNIVERSAL I (CUSTOM PROCEDURE TRAY) ×2 IMPLANT
PATTIES SURGICAL .5 X.5 (GAUZE/BANDAGES/DRESSINGS) ×4 IMPLANT
PATTIES SURGICAL .5 X1 (DISPOSABLE) ×4 IMPLANT
RUBBERBAND STERILE (MISCELLANEOUS) IMPLANT
SPONGE LAP 4X18 RFD (DISPOSABLE) ×2 IMPLANT
SPONGE SURGIFOAM ABS GEL 100 (HEMOSTASIS) IMPLANT
STAPLER VISISTAT 35W (STAPLE) IMPLANT
SURGIFLO W/THROMBIN 8M KIT (HEMOSTASIS) ×2 IMPLANT
SUT BONE WAX W31G (SUTURE) ×2 IMPLANT
SUT MNCRL AB 3-0 PS2 18 (SUTURE) ×2 IMPLANT
SUT MON AB 3-0 SH 27 (SUTURE) ×1
SUT MON AB 3-0 SH27 (SUTURE) ×1 IMPLANT
SUT NURALON 4 0 TR CR/8 (SUTURE) ×2 IMPLANT
SUT VIC AB 1 CT1 18XCR BRD 8 (SUTURE) ×1 IMPLANT
SUT VIC AB 1 CT1 27 (SUTURE)
SUT VIC AB 1 CT1 27XBRD ANTBC (SUTURE) IMPLANT
SUT VIC AB 1 CT1 8-18 (SUTURE) ×1
SUT VIC AB 2-0 CT1 18 (SUTURE) ×2 IMPLANT
SUT VIC AB 2-0 CT1 27 (SUTURE) ×1
SUT VIC AB 2-0 CT1 TAPERPNT 27 (SUTURE) ×1 IMPLANT
SUT VICRYL 0 UR6 27IN ABS (SUTURE) ×2 IMPLANT
SYR BULB IRRIGATION 50ML (SYRINGE) ×2 IMPLANT
SYR CONTROL 10ML LL (SYRINGE) ×2 IMPLANT
TOWEL GREEN STERILE (TOWEL DISPOSABLE) ×2 IMPLANT
TOWEL GREEN STERILE FF (TOWEL DISPOSABLE) ×2 IMPLANT
TRAY FOLEY MTR SLVR 16FR STAT (SET/KITS/TRAYS/PACK) IMPLANT
WATER STERILE IRR 1000ML POUR (IV SOLUTION) ×2 IMPLANT
YANKAUER SUCT BULB TIP NO VENT (SUCTIONS) IMPLANT

## 2018-06-05 NOTE — Op Note (Signed)
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  This was directly repaired with suture and DuraGen patch.  At conclusion of the case there was no further CSF leak with Valsalva to 40 mmHg.  First assistant: Anette Riedelarmen Mayo, PA  Indications: This is a very pleasant 52 year old gentleman who presented to my care with significant back buttock and neuropathic leg pain left worse than the right.  Symptoms consistent with spinal stenosis with neurogenic claudication.  Extent of disease confirmed with CT myelogram.  Attempts to control his pain conservatively had failed and so elected to proceed with surgery.  All appropriate risks benefits and alternatives were explained to the patient and his wife and consent was obtained.  Operative note patient was brought the operating placed by the operating room table.  After successful induction of general anesthesia and endotracheally patient teds SCDs and a Foley were inserted and the patient was turned prone onto the pro axis table.  The back was prepped and draped in a standard fashion.  Patient was then placed into a kyphotic position to facilitate the decompression.  A timeout was then taken to confirm patient procedure and all other important data.  2 needles were then placed in the back and an x-ray was taken to map out the incision site.  The central incision was then infiltrated with quarter percent Marcaine and a midline incision was made.  Sharp dissection was carried out down to the deep tissue.  The deep tissue was sharply incised and I stripped the paraspinal muscles to expose the lamina of L4 and L5 and a portion of that of S1.  There was a significant osteophyte on the right L5-S1 facet complex consistent with what  was seen on the preoperative MRI.  This was taken down with a double-action [employer] Wholesale[employer] Roger.  A Penfield 4 was then placed underneath the lamina of L5 and a second x-ray was taken confirming the L5 lamina.  The spinous process of L5 was removed and block with a Kerrison Roger and the inferior two thirds of the L4 spinous process was removed.  Using a Kerrison rongeur a generous laminotomy of L5 was performed.  This allowed me to dissect through the central raphae of the ligamentum flavum using a Penfield 4 and then use my 2 and 3 mm Kerrison punch to remove the central portion of the ligamentum flavum and exposed the underlying thecal sac.  Palpation into the lateral recess on the left side revealed the medial exostoses from the facet complex that was causing lateral recess stenosis.  Using a neuro patty I developed a plane between the thecal sac and the facet and then use my 2 mm Kerrison punch to perform lateral recess decompression.  I carried this decompression out laterally to the medial wall of the pedicle and I traced the S1 nerve root into the foramen this was done bilaterally.  I then gently developed a plane underneath the remaining portion of the L5 lamina and protected the underlying thecal sac with my neuropathic this allowed me to complete the L5 laminectomy.  I then continued out removing the rest of the lamina so that I had a comprehensive central decompression and lateral recess decompression.  Upon inspection of the CT scan actually at the L4-5 disc level there was a significant osteophyte that was  causing central compression.  As I began to gently dissect with my Nicholos Johns for creating a plane I was able to pass a neuro patty between the lamina of L4 and the thecal sac and resected with my Kerrison Roger.  At this point the thecal sac actually started to reexpand as the decompression was performed.  Unfortunately there was still a spike of bone that caused the pain in the area of the thecal sac to  tear and there was obvious CSF leak.  At this point I continued my decompression superiorly until I could palpate the inferior aspect of the L4 pedicle bilaterally.  This spanned the area of maximum neural compression based on the preoperative CT myelogram.  At this point I had foraminotomies of L4-L5 and S1 bilaterally and medial facetectomies at L4-5 and L5-S1 bilaterally.  At this point satisfied with the decompression I then identified the CSF leak and using the 4-0 Nurolon suture over stitch the leak.  At this point I could identify no further CSF leakage.  I then had the anesthesiologist performed a Valsalva to 40 mmHg and hold it for 10 seconds.  There was no CSF leak during this period.  At this point I then used bipolar cautery and FloSeal to obtain hemostasis by coagulating the epidural veins in the muscle.  Once I had hemostasis I then placed a DuraGen patch over the area where the CSF leak had occurred.  I then closed the wound in a layered fashion with #1 Vicryl suture 2-0 Vicryl suture, and 3-0 Monocryl.  Should be noted that prior to placing the gym patch I did copiously irrigated the wound with normal saline.  Sterile dressings were then placed and the patient was ultimately extubated transfer the PACU.  At the end of the case all needle sponge counts were correct.

## 2018-06-05 NOTE — Anesthesia Procedure Notes (Signed)
Procedure Name: Intubation Date/Time: 06/05/2018 2:44 PM Performed by: Lovie Cholock, Ilena Dieckman K, CRNA Pre-anesthesia Checklist: Patient identified, Emergency Drugs available, Suction available and Patient being monitored Patient Re-evaluated:Patient Re-evaluated prior to induction Oxygen Delivery Method: Circle System Utilized Preoxygenation: Pre-oxygenation with 100% oxygen Induction Type: IV induction Ventilation: Mask ventilation without difficulty, Oral airway inserted - appropriate to patient size and Two handed mask ventilation required Laryngoscope Size: Miller and 3 Grade View: Grade III Tube type: Oral Tube size: 7.5 mm Number of attempts: 1 Airway Equipment and Method: Stylet and Oral airway Placement Confirmation: ETT inserted through vocal cords under direct vision,  positive ETCO2 and breath sounds checked- equal and bilateral Secured at: 21 cm Tube secured with: Tape Dental Injury: Teeth and Oropharynx as per pre-operative assessment

## 2018-06-05 NOTE — Anesthesia Preprocedure Evaluation (Addendum)
Anesthesia Evaluation  Patient identified by MRN, date of birth, ID band Patient awake    Reviewed: Allergy & Precautions, NPO status , Patient's Chart, lab work & pertinent test results  Airway Mallampati: IV  TM Distance: >3 FB Neck ROM: Full    Dental no notable dental hx. (+) Dental Advisory Given, Chipped,    Pulmonary former smoker,    Pulmonary exam normal breath sounds clear to auscultation       Cardiovascular hypertension, Pt. on medications Normal cardiovascular exam+ pacemaker  Rhythm:Regular Rate:Normal  ECG: NSR, rate 63  Sees cardiologist (Dr. C)   Neuro/Psych PSYCHIATRIC DISORDERS Anxiety Depression negative neurological ROS     GI/Hepatic negative GI ROS, Neg liver ROS,   Endo/Other  diabetes, Poorly Controlled, Type 2, Insulin Dependent  Renal/GU negative Renal ROS     Musculoskeletal negative musculoskeletal ROS (+)   Abdominal   Peds  Hematology negative hematology ROS (+)   Anesthesia Other Findings Lumbar spinal stenosis with neurogenic claudication  Reproductive/Obstetrics                          Anesthesia Physical Anesthesia Plan  ASA: III  Anesthesia Plan: General   Post-op Pain Management:    Induction: Intravenous  PONV Risk Score and Plan: 2 and Ondansetron, Dexamethasone, Midazolam and Treatment may vary due to age or medical condition  Airway Management Planned: Oral ETT  Additional Equipment:   Intra-op Plan:   Post-operative Plan: Extubation in OR  Informed Consent: I have reviewed the patients History and Physical, chart, labs and discussed the procedure including the risks, benefits and alternatives for the proposed anesthesia with the patient or authorized representative who has indicated his/her understanding and acceptance.   Dental advisory given  Plan Discussed with: CRNA  Anesthesia Plan Comments:         Anesthesia Quick  Evaluation

## 2018-06-05 NOTE — Transfer of Care (Signed)
Immediate Anesthesia Transfer of Care Note  Patient: Theodore ReachKenneth L Valcarcel  Procedure(s) Performed: Lumbar decompression L4-S1 (N/A )  Patient Location: PACU  Anesthesia Type:General  Level of Consciousness: drowsy  Airway & Oxygen Therapy: Patient Spontanous Breathing and Patient connected to face mask oxygen  Post-op Assessment: Report given to RN and Post -op Vital signs reviewed and stable  Post vital signs: Reviewed and stable  Last Vitals:  Vitals Value Taken Time  BP 103/63 06/05/2018  6:05 PM  Temp    Pulse 102 06/05/2018  6:06 PM  Resp 24 06/05/2018  6:06 PM  SpO2 96 % 06/05/2018  6:06 PM  Vitals shown include unvalidated device data.  Last Pain:  Vitals:   06/05/18 1229  TempSrc:   PainSc: 2       Patients Stated Pain Goal: 2 (06/05/18 1229)  Complications: No apparent anesthesia complications

## 2018-06-05 NOTE — Discharge Instructions (Signed)
Lumbar Diskectomy, Care After °Refer to this sheet in the next few weeks. These instructions provide you with information about caring for yourself after your procedure. Your health care provider may also give you more specific instructions. Your treatment has been planned according to current medical practices, but problems sometimes occur. Call your health care provider if you have any problems or questions after your procedure. °What can I expect after the procedure? °After the procedure, it is common to have: °· Pain. °· Numbness. °· Weakness. ° °Follow these instructions at home: °Medicines °· Take medicines only as directed by your health care provider. °· If you were prescribed an antibiotic medicine, finish all of it even if you start to feel better. °Incision care °· There are many different ways to close and cover an incision, including stitches (sutures), skin glue, and adhesive strips. Follow your health care provider's instructions about: °? Incision care. °? Bandage (dressing) changes and removal. °? Incision closure removal. °· Check your incision area every day for signs of infection. If you cannot see your incision, have someone check it for you. Watch for: °? Redness, swelling, or pain. °? Fluid, blood, or pus. °Activity °· Avoid sitting for longer than 20 minutes at a time or as directed by your health care provider. °· Do not climb stairs more than once each day until your health care provider approves. °· Do not bend at your waist. To pick things up, bend your knees. °· Do not lift anything that is heavier than 10 lb (4.5 kg) or as directed by your health care provider. °· Do not drive a car until your health care provider approves. °· Ask your health care provider when you may return to your normal activities, such as playing sports and going back to work. °· Work with your physical therapist to learn safe movement and exercises to help healing. Do these exercises as directed. °· Take short  walks often. °General instructions °· Do not use any tobacco products, including cigarettes, chewing tobacco, or electronic cigarettes. If you need help quitting, ask your health care provider. °· Follow your health care provider’s instructions about bathing. Do not take baths, shower, swim, or use a hot tub until your health care provider approves. °· Wear your back brace as directed by your health care provider. °· To prevent constipation: °? Drink enough fluid to keep your urine clear or pale yellow. °? Eat plenty of fruits, vegetables, and whole grains. °· Keep all follow-up visits as directed by your health care provider. This is important. This includes any follow-up visits with your physical therapist. °Contact a health care provider if: °· You have a fever. °· You have redness, swelling, or pain in your incision area. °· Your pain is not controlled with medicine. °· You have pain, numbness, or weakness that lasts longer than three weeks after surgery. °· You become constipated. °Get help right away if: °· You have fluid, blood, or pus coming from your incision. °· You have increasing pain, numbness, or weakness. °· You lose control of when you urinate or have a bowel movement (incontinence). °· You have chest pain. °· You have trouble breathing. °This information is not intended to replace advice given to you by your health care provider. Make sure you discuss any questions you have with your health care provider. °Document Released: 08/15/2004 Document Revised: 02/16/2016 Document Reviewed: 05/05/2014 °Elsevier Interactive Patient Education © 2018 [employer] ° °

## 2018-06-05 NOTE — Brief Op Note (Signed)
06/05/2018  6:05 PM  PATIENT:  Darrel ReachKenneth L Holderman  52 y.o. male  PRE-OPERATIVE DIAGNOSIS:  Lumbar spinal stenosis with neurogenic claudication  POST-OPERATIVE DIAGNOSIS:  Lumbar spinal stenosis with neurogenic claudication  PROCEDURE:  Procedure(s) with comments: Lumbar decompression L4-S1 (N/A) - 3 hrs  SURGEON:  Surgeon(s) and Role:    [name]* Rocco Kerkhoff, MD - Primary  PHYSICIAN ASSISTANT:   ASSISTANTS: Anette Riedelarmen Mayo, PA   ANESTHESIA:   general  EBL:  150 mL   BLOOD ADMINISTERED:none  DRAINS: none   LOCAL MEDICATIONS USED:  MARCAINE     SPECIMEN:  No Specimen  DISPOSITION OF SPECIMEN:  N/A  COUNTS:  YES  TOURNIQUET:  * No tourniquets in log *  DICTATION: .Dragon Dictation  PLAN OF CARE: Admit to inpatient   PATIENT DISPOSITION:  PACU - hemodynamically stable.

## 2018-06-06 ENCOUNTER — Encounter (HOSPITAL_COMMUNITY): Payer: Self-pay | Admitting: Orthopedic Surgery

## 2018-06-06 DIAGNOSIS — G903 Multi-system degeneration of the autonomic nervous system: Secondary | ICD-10-CM | POA: Diagnosis not present

## 2018-06-06 DIAGNOSIS — M48062 Spinal stenosis, lumbar region with neurogenic claudication: Secondary | ICD-10-CM | POA: Diagnosis not present

## 2018-06-06 LAB — GLUCOSE, CAPILLARY
Glucose-Capillary: 118 mg/dL — ABNORMAL HIGH (ref 70–99)
Glucose-Capillary: 137 mg/dL — ABNORMAL HIGH (ref 70–99)
Glucose-Capillary: 177 mg/dL — ABNORMAL HIGH (ref 70–99)
Glucose-Capillary: 136 mg/dL — ABNORMAL HIGH (ref 70–99)
Glucose-Capillary: 160 mg/dL — ABNORMAL HIGH (ref 70–99)

## 2018-06-06 MED ORDER — SODIUM CHLORIDE 0.9 % IV BOLUS
1000.0000 mL | Freq: Once | INTRAVENOUS | Status: AC
  Administered 2018-06-06: 1000 mL via INTRAVENOUS

## 2018-06-06 MED ORDER — SODIUM CHLORIDE 0.9 % IV SOLN
INTRAVENOUS | Status: AC
  Administered 2018-06-06 – 2018-06-07 (×3): via INTRAVENOUS

## 2018-06-06 MED FILL — Thrombin (Recombinant) For Soln 20000 Unit: CUTANEOUS | Qty: 1 | Status: AC

## 2018-06-06 NOTE — Anesthesia Postprocedure Evaluation (Signed)
Anesthesia Post Note  Patient: Theodore ReachKenneth L [name]  Procedure(s) Performed: Lumbar decompression L4-S1 (N/A )     Patient location during evaluation: PACU Anesthesia Type: General Level of consciousness: awake and alert and oriented Pain management: pain level controlled Vital Signs Assessment: post-procedure vital signs reviewed and stable Respiratory status: spontaneous breathing, nonlabored ventilation, respiratory function stable and patient connected to nasal cannula oxygen Cardiovascular status: blood pressure returned to baseline and stable Postop Assessment: no apparent nausea or vomiting Anesthetic complications: no           Asuka Dusseau A.

## 2018-06-06 NOTE — Evaluation (Signed)
Occupational Therapy Evaluation Patient Details Name: Theodore [name] MRN: [id number] DOB: [birth year] Today's Date: 06/06/2018    History of Present Illness Pt is a 52 y.o. M with significant PMH of diabetes mellitus and pacemaker who is s/p lumbar decompression L4-S1. During surgery, pt had dural tear but no further CSF leak.   Clinical Impression   Limited evaluation due to hypotension (RN aware). Pt requires supervision to min assist for mobility and set up to max assist for ADL. Educated in back precautions related to ADL and IADL. Will follow acutely and educate in use of AE next visit. Pt reports he has had episodes of syncope since his pacemaker due to dehydration and low blood sugar.     Follow Up Recommendations  No OT follow up    Equipment Recommendations  None recommended by OT    Recommendations for Other Services       Precautions / Restrictions Precautions Precautions: Fall;Back Precaution Booklet Issued: Yes (comment) Precaution Comments: verbally reviewed and provided written handout.  Required Braces or Orthoses: Spinal Brace Spinal Brace: Lumbar corset;Applied in sitting position Restrictions Weight Bearing Restrictions: No Other Position/Activity Restrictions: watch orthostatics      Mobility Bed Mobility Overal bed mobility: Modified Independent             General bed mobility comments: increased time, cues for log roll technique  Transfers Overall transfer level: Needs assistance Equipment used: 1 person hand held assist Transfers: Sit to/from Stand Sit to Stand: Min guard         General transfer comment: min guard for safety due to hypotension, increased time    Balance Overall balance assessment: No apparent balance deficits (not formally assessed)                                         ADL either performed or assessed with clinical judgement   ADL Overall ADL's : Needs assistance/impaired Eating/Feeding:  Independent;Sitting   Grooming: Wash/dry hands;Wash/dry face;Sitting;Set up   Upper Body Bathing: Sitting;Supervision/ safety;Set up   Lower Body Bathing: Moderate assistance;Sit to/from stand   Upper Body Dressing : Set up;Sitting;Supervision/safety   Lower Body Dressing: Sit to/from stand;Maximal assistance   Toilet Transfer: Minimal assistance;Ambulation   Toileting- Clothing Manipulation and Hygiene: Min guard;Sit to/from stand       Functional mobility during ADLs: Minimal assistance(hand held x 4 feet) General ADL Comments: educated in back precautions related to ADL and IADL     Vision Baseline Vision/History: No visual deficits       Perception     Praxis      Pertinent Vitals/Pain Pain Assessment: Faces Faces Pain Scale: Hurts even more Pain Location: back Pain Descriptors / Indicators: Guarding;Grimacing Pain Intervention(s): Repositioned;Premedicated before session;Monitored during session     Hand Dominance Right   Extremity/Trunk Assessment Upper Extremity Assessment Upper Extremity Assessment: Overall WFL for tasks assessed   Lower Extremity Assessment Lower Extremity Assessment: Defer to PT evaluation   Cervical / Trunk Assessment Cervical / Trunk Assessment: Other exceptions Cervical / Trunk Exceptions: s/p lumbar decompression   Communication Communication Communication: No difficulties   Cognition Arousal/Alertness: Awake/alert Behavior During Therapy: WFL for tasks assessed/performed Overall Cognitive Status: Within Functional Limits for tasks assessed  General Comments  pt wife present    Exercises     Shoulder Instructions      Home Living Family/patient expects to be discharged to:: Private residence Living Arrangements: Spouse/significant other;Children Available Help at Discharge: Family;Available 24 hours/day Type of Home: House Home Access: Stairs to enter ITT IndustriesEntrance  Stairs-Number of Steps: 5 Entrance Stairs-Rails: Can reach both Home Layout: One level     Bathroom Shower/Tub: Producer, television/film/videoWalk-in shower   Bathroom Toilet: Handicapped height Bathroom Accessibility: Yes   Home Equipment: Bedside commode          Prior Functioning/Environment Level of Independence: Independent        Comments: Worked in assembly         OT Problem List: Pain;Decreased knowledge of precautions;Decreased knowledge of use of DME or AE      OT Treatment/Interventions: Self-care/ADL training;DME and/or AE instruction;Patient/family education;Balance training;Therapeutic activities    OT Goals(Current goals can be found in the care plan section) Acute Rehab OT Goals Patient Stated Goal: "not mess this surgery up." OT Goal Formulation: With patient Time For Goal Achievement: 06/20/18 Potential to Achieve Goals: Good ADL Goals Pt Will Perform Grooming: with modified independence;standing Pt Will Perform Lower Body Bathing: with modified independence;with adaptive equipment;sit to/from stand Pt Will Perform Lower Body Dressing: with modified independence;with adaptive equipment;sit to/from stand Pt Will Transfer to Toilet: with modified independence;ambulating;regular height toilet Pt Will Perform Toileting - Clothing Manipulation and hygiene: with modified independence;sit to/from stand  OT Frequency: Min 2X/week   Barriers to D/C:            Co-evaluation              AM-PAC PT "6 Clicks" Daily Activity     Outcome Measure Help from another person eating meals?: None Help from another person taking care of personal grooming?: A Little Help from another person toileting, which includes using toliet, bedpan, or urinal?: A Little Help from another person bathing (including washing, rinsing, drying)?: A Little Help from another person to put on and taking off regular upper body clothing?: None Help from another person to put on and taking off regular lower  body clothing?: A Lot 6 Click Score: 19   End of Session Nurse Communication: (aware of BPs)  Activity Tolerance: Treatment limited secondary to medical complications (Comment)(BP 104/67 in standing, 91/68 in sitting, 87/ after walking) Patient left: in bed;with call bell/phone within reach;with SCD's reapplied;with family/visitor present  OT Visit Diagnosis: Pain;Dizziness and giddiness (R42)                Time: 1536-1610 OT Time Calculation (min): 34 min Charges:  OT General Charges $OT Visit: 1 Visit OT Evaluation $OT Eval Low Complexity: 1 Low OT Treatments $Self Care/Home Management : 8-22 mins  Martie RoundJulie Smayan Hackbart, OTR/L Acute Rehabilitation Services Pager: [phone] Office: [phone]  Evern BioMayberry, Jazion Atteberry Lynn 06/06/2018, 4:23 PM

## 2018-06-06 NOTE — Consult Note (Signed)
Medical Consultation   Theodore ReachKenneth L Holness  ZOX:[id number]  DOB: 28-Apr-1966  DOA: 06/05/2018  PCP: [facility]    Requesting physician: Dr. Shon BatonBrooks [facility]  Reason for consultation: Static hypotension this morning after surgery   History of Present Illness: Theodore [name] is an 52 y.o. male past medical history significant for spinal stenosis, diabetes type 2, sinal atrial bradycardia, depression with anxiety, neurocardiogenic syncope, pacemaker paroxysmal atrial fibrillation who underwent lumbar spine surgery for decompression of spinal cord yesterday.  Blood glucoses have been well controlled.  He got out of bed today and promptly had an episode of hypotension.  Feeling weak and dizzy.  Review of his records back to 2015 shows that he has been followed by Dr. Hassel Nethroitro for neurocardiogenic syncope requiring pacer placement of the pacemaker.  Dr. Earlean Polkaritchlow's most recent note dated March 2019 discussed with the patient having syncope associated with discussion of presence of medical procedures.  In fact the patient's only episode of atrial fibrillation recorded on his device was in December 2018 when his mother was gravely ill and subsequently passed.  Patient's blood pressure dropped into the 60s this morning.  He received 1 L of normal saline bolus.  After that his blood pressure was rechecked and he still was orthostatic with blood pressure dropping from 91/58 with a pulse of 85 lying down to 68/46 with a pulse of 53 standing up.  I Then ordered 1 L of normal saline bolus followed by normal saline 150 mL/h for 16 hours. The patient was not feeling ill at the time.  Review of Systems:   As per HPI otherwise 10 point review of systems negative.   Pressure  Past Medical History: Past Medical History:  Diagnosis Date  . Anxiety   . Arthritis   . Bradycardia   . Depression   . Diabetes mellitus    dx 2010  . Pacemaker    Permanent  pacemaker placement by Dr. Molly Maduroobert McQueen--10/21/2007--a [employer]onic Adapta--serial number #[id number]  . Syncope 09/2007   2D Echo--History of frequent episodes of syncope beginning at 52 years of age--usually associated with some type of vagal issue but he does completely lose consciousness    Past Surgical History: Past Surgical History:  Procedure Laterality Date  . CARDIAC CATHETERIZATION Right 10/20/2007  . HAND SURGERY    . LUMBAR LAMINECTOMY/DECOMPRESSION MICRODISCECTOMY N/A 06/05/2018   Procedure: Lumbar decompression L4-S1;  Surgeon: Venita LickBrooks, Dahari, MD;  [facility];  Service: Orthopedics;  Laterality: N/A;  3 hrs  . PACEMAKER INSERTION  10/21/2007   Dr Jenne CampusMcQueen  . SHOULDER SURGERY  1999  . TONSILLECTOMY       Allergies:   Allergies  Allergen Reactions  . Cephalexin Hives  . Vancomycin Hives, Itching and Other (See Comments)    True Allergic Reaction -- Not "Red Man's Syndrome Onset of prominent red rash, severe itching/hives Not responsive to Benadryl and IV rate reduction. First seen pre-op for surgery on 06/05/18.     Social History:  reports that he quit smoking about 10 years ago. He has quit using smokeless tobacco. He reports that he does not drink alcohol or use drugs.   Family History: Family History  Problem Relation Age of Onset  . Diabetes Sister   . Diabetes Mother   . Coronary artery disease Mother 3450's  . Hypertension Mother      Physical Exam: Vitals:  06/06/18 1159 06/06/18 1248 06/06/18 1250 06/06/18 1252  BP: 108/63 (!) 91/58 90/64 (!) 68/46  Pulse: 79 85 91 79  Resp: 16 16 18 18   Temp: 98 F (36.7 C) 98.4 F (36.9 C)    TempSrc: Oral Oral    SpO2: 97% 97% 95% 97%  Weight:      Height:        Constitutional: Well-developed well-nourished alert and awake, oriented x3, not in any acute distress. Eyes: PERLA, EOMI, irises appear normal, anicteric sclera,  ENMT: external ears and nose appear normal, no hearing            Lips  appears normal, oropharynx mucosa, tongue, posterior pharynx appear normal  Neck: neck appears normal, no masses, normal ROM, no thyromegaly, no JVD  CVS: S1-S2 clear, no murmur rubs or gallops, no LE edema, normal pedal pulses  Respiratory:  clear to auscultation bilaterally, no wheezing, rales or rhonchi. Respiratory effort normal. No accessory muscle use.  Abdomen: soft nontender, nondistended, normal bowel sounds, no hepatosplenomegaly, no hernias  Musculoskeletal: : no cyanosis, clubbing or edema noted bilaterally                       good tone no arthropathy Neuro: Cranial nerves II-XII intact, strength, sensation, reflexes Psych: judgement and insight appear normal, stable mood and affect, mental status Skin: no rashes or lesions or ulcers, no induration or nodules    Data reviewed:  I have personally reviewed following labs and imaging studies Labs:  GFR Estimated Creatinine Clearance: 92.9 mL/min (by C-G formula based on SCr of 0.93 mg/dL). CBG: Recent Labs  Lab 06/05/18 1552 06/05/18 1805 06/05/18 2143 06/06/18 0638 06/06/18 1201  GLUCAP 118* 106* 273* 137* 177*   Urinalysis    Component Value Date/Time   COLORURINE YELLOW 03/12/2016 2046   APPEARANCEUR CLEAR 03/12/2016 2046   LABSPEC 1.035 (H) 03/12/2016 2046   PHURINE 6.0 03/12/2016 2046   GLUCOSEU >1000 (A) 03/12/2016 2046   HGBUR NEGATIVE 03/12/2016 2046   BILIRUBINUR NEGATIVE 03/12/2016 2046   KETONESUR NEGATIVE 03/12/2016 2046   PROTEINUR NEGATIVE 03/12/2016 2046   UROBILINOGEN 1.0 01/31/2013 1505   NITRITE NEGATIVE 03/12/2016 2046   LEUKOCYTESUR NEGATIVE 03/12/2016 2046     Microbiology Recent Results (from the past 240 hour(s))  Surgical pcr screen     Status: None   Collection Time: 05/30/18  8:52 AM  Result Value Ref Range Status   MRSA, PCR NEGATIVE NEGATIVE Final   Staphylococcus aureus NEGATIVE NEGATIVE Final    Comment: (NOTE) The Xpert SA Assay (FDA approved for NASAL specimens in patients  58 years of age and older), is one component of a comprehensive surveillance program. It is not intended to diagnose infection nor to guide or monitor treatment. Performed at [facility], [address] [place], [place] [address]        Inpatient Medications:   Scheduled Meds: . canagliflozin  100 mg Oral QAC breakfast  . citalopram  20 mg Oral Daily  . docusate sodium  100 mg Oral BID  . fenofibrate  160 mg Oral Daily  . gabapentin  300 mg Oral BH-q7a  . glimepiride  4 mg Oral Daily  . insulin aspart  0-15 Units Subcutaneous TID WC  . insulin aspart  0-5 Units Subcutaneous QHS  . sodium chloride flush  3 mL Intravenous Q12H   Continuous Infusions: . sodium chloride    . sodium chloride 150 mL/hr at 06/06/18 1517  .  clindamycin (CLEOCIN) IV Stopped (06/06/18 1016)  . lactated ringers 50 mL/hr at 06/05/18 1300  . lactated ringers       Radiological Exams on Admission: Dg Lumbar Spine 1 View  Result Date: 06/05/2018 CLINICAL DATA:  Localization image for Lumbar Decompression L4-S1 for Lumbar Spinal Stenosis with Neurogenic Claudication. EXAM: LUMBAR SPINE - 1 VIEW COMPARISON:  None. FINDINGS: Intraoperative cross-table lateral film of the lumbar spine is provided. Surgical overlies the posterior elements at the L3-4 disc space level. Additional surgical probe is directed towards the posterior elements at the L5 vertebral body level. IMPRESSION: 1. Surgical probe overlying the posterior elements at the L3-4 disc space level. 2. Additional surgical probe overlying the soft tissues posterior to the posterior elements at the L5 level. Electronically Signed   By: Bary Richard M.D.   On: 06/05/2018 20:44   Dg Spine Portable 1 View  Result Date: 06/05/2018 CLINICAL DATA:  Localization L4-S1 decompression for lumbar spinal stenosis with neurogenic claudication. EXAM: PORTABLE SPINE - 1 VIEW COMPARISON:  CT lumbar myelogram dated 05/02/2018 FINDINGS: Intraoperative lateral view  of the lumbosacral spine. Based on the numbering on the CT myelogram, surgical probe overlies the posterior elements at the level of the L5-S1 disc space. IMPRESSION: Surgical probe overlying the posterior elements at the level of the L5-S1 disc space. Electronically Signed   By: Bary Richard M.D.   On: 06/05/2018 15:45    Impression/Recommendations Principal Problem:   Neurogenic orthostatic hypotension (HCC) Active Problems:   Neurocardiogenic syncope   Status post lumbar spine surgery for decompression of spinal cord   Sinoatrial bradycardia   Pacemaker   Paroxysmal atrial fibrillation (HCC)   Diabetes mellitus type 2, uncontrolled, without complications (HCC)   Depression with anxiety  1.  Neurocardiogenic orthostatic hypotension: Patient has a long history of this and in fact has a pacemaker in place due to it.  I suspect that manipulation of his spinal cord may have stimulated this situation in this patient.  We will hydrate him overnight.  If he remains orthostatic would recommend cardiology consultation.  2.  History of neurocardiogenic syncope: Patient had a pacemaker placed to prevent this event.  Suspect that today's event is similar.  3.  Status post lumbar spine surgery for decompression of spinal cord: Continue management as per Dr. Shon Baton.  4.  Pacemaker: The last pacemaker check documented in 03/10/2018 appears to be acceptable.  5.  Diabetes type 2 uncontrolled without complications: Patient has been fairly well controlled in the hospital.  We will continue to monitor.  6.  Depression with anxiety: Continue supportive care and home medications.   Thank you for this consultation.  Our [facility] hospitalist team will follow the patient with you.   Time Spent: 45 minutes  Lahoma Crocker M.D. [facility] 06/06/2018, 3:47 PM

## 2018-06-06 NOTE — Progress Notes (Signed)
Patient had a syncopal episode while working with PT this morning. IV fluids restarted with bolus given. Patient stated he felt better when lying back in bed. V?S taken and improved. MD notified and orders placed and carried out for orthostatic BP. During the standing position, patient became flushed, diaphoretic and complained of blurred vision and dizziness. Assisted back to back and he stated he felt ok. Patient is concerned about the cause of his symptoms. MD aware. Will continue to monitor

## 2018-06-06 NOTE — Evaluation (Addendum)
Physical Therapy Evaluation Patient Details Name: Theodore [name] MRN: [id number] DOB: 02/19/1966 Today's Date: 06/06/2018   History of Present Illness  Pt is a 52 y.o. M with significant PMH of diabetes mellitus and pacemaker who is s/p lumbar decompression L4-S1. During surgery, pt had dural tear but no further CSF leak.  Clinical Impression  Treatment limited by orthostatic hypotension and dizziness with changing position. Upon transition to standing, patient with sudden onset of significant dizziness and diaphoresis and assisted to sitting. BP 51/37, HR 60. Returned back to bed in Trendelenberg position. Reassessment of BP 109/77. RN Theodore [name] aware and present. Suspect patient will progress well once vitals are stabilized. Will follow acutely.    Follow Up Recommendations No PT follow up;Supervision for mobility/OOB    Equipment Recommendations  None recommended by PT    Recommendations for Other Services       Precautions / Restrictions Precautions Precautions: Fall;Back Precaution Booklet Issued: Yes (comment) Precaution Comments: verbally reviewed and provided written handout.  Required Braces or Orthoses: Spinal Brace Spinal Brace: Lumbar corset;Applied in sitting position Restrictions Weight Bearing Restrictions: No Other Position/Activity Restrictions: watch orthostatics      Mobility  Bed Mobility Overal bed mobility: Modified Independent             General bed mobility comments: able to self cue for log roll technique. increased time  Transfers Overall transfer level: Needs assistance Equipment used: None Transfers: Sit to/from Stand Sit to Stand: Supervision            Ambulation/Gait                Stairs            Wheelchair Mobility    Modified Rankin (Stroke Patients Only)       Balance Overall balance assessment: No apparent balance deficits (not formally assessed)                                            Pertinent Vitals/Pain Pain Assessment: Faces Faces Pain Scale: Hurts little more Pain Location: incisional site Pain Descriptors / Indicators: Guarding;Grimacing Pain Intervention(s): Monitored during session    Home Living Family/patient expects to be discharged to:: Private residence Living Arrangements: Spouse/significant other;Children Available Help at Discharge: Family Type of Home: House Home Access: Stairs to enter Entrance Stairs-Rails: Can [name] both Secretary/administratorntrance Stairs-Number of Steps: 5 Home Layout: One level Home Equipment: Shower seat      Prior Function Level of Independence: Independent         Comments: Worked in Doctor, hospitalassembly      Hand Dominance        Extremity/Trunk Assessment   Upper Extremity Assessment Upper Extremity Assessment: Overall WFL for tasks assessed    Lower Extremity Assessment Lower Extremity Assessment: Overall WFL for tasks assessed    Cervical / Trunk Assessment Cervical / Trunk Assessment: Other exceptions Cervical / Trunk Exceptions: s/p lumbar decompression  Communication   Communication: No difficulties  Cognition Arousal/Alertness: Awake/alert Behavior During Therapy: WFL for tasks assessed/performed Overall Cognitive Status: Within Functional Limits for tasks assessed                                        General Comments General comments (skin integrity, edema, etc.): pt wife present  Exercises     Assessment/Plan    PT Assessment Patient needs continued PT services  PT Problem List Decreased activity tolerance;Decreased mobility;Decreased balance;Pain       PT Treatment Interventions Gait training;Stair training;Functional mobility training;Therapeutic activities;Therapeutic exercise;Balance training;Patient/family education    PT Goals (Current goals can be found in the Care Plan section)  Acute Rehab PT Goals Patient Stated Goal: "not mess this surgery up." PT Goal Formulation:  With patient/family Time For Goal Achievement: 06/08/18 Potential to Achieve Goals: Good    Frequency Min 5X/week   Barriers to discharge        Co-evaluation               AM-PAC PT "6 Clicks" Daily Activity  Outcome Measure Difficulty turning over in bed (including adjusting bedclothes, sheets and blankets)?: A Little Difficulty moving from lying on back to sitting on the side of the bed? : A Little Difficulty sitting down on and standing up from a chair with arms (e.g., wheelchair, bedside commode, etc,.)?: A Little Help needed moving to and from a bed to chair (including a wheelchair)?: A Little Help needed walking in hospital room?: A Little Help needed climbing 3-5 steps with a railing? : A Little 6 Click Score: 18    End of Session Equipment Utilized During Treatment: Gait belt Activity Tolerance: Treatment limited secondary to medical complications (Comment) Patient left: in bed;with call bell/phone within [name];with nursing/sitter in room Nurse Communication: Other (comment)(vitals) PT Visit Diagnosis: Pain;Dizziness and giddiness (R42);Difficulty in walking, not elsewhere classified (R26.2) Pain - part of body: (back)    Time: 9604-5409 PT Time Calculation (min) (ACUTE ONLY): 17 min   Charges:   PT Evaluation $PT Eval Low Complexity: 1 Low         Laurina Bustle, PT, DPT Acute Rehabilitation Services Pager [phone] Office [phone]   Vanetta Mulders 06/06/2018, 1:19 PM

## 2018-06-06 NOTE — Plan of Care (Signed)
  Problem: Education: Goal: Knowledge of General Education information will improve Description Including pain rating scale, medication(s)/side effects and non-pharmacologic comfort measures Outcome: Progressing   

## 2018-06-06 NOTE — Progress Notes (Signed)
    Subjective: Procedure(s) (LRB): Lumbar decompression L4-S1 (N/A) 1 Day Post-Op  Patient reports pain as 2 on 0-10 scale.  Reports decreased leg pain reports incisional back pain   N/A void - foley in place to be removed this AM Negative bowel movement Positive flatus Negative chest pain or shortness of breath  Objective: Vital signs in last 24 hours: Temp:  [97.3 F (36.3 C)-98.2 F (36.8 C)] 98.1 F (36.7 C) (09/13 0739) Pulse Rate:  [76-106] 81 (09/13 0739) Resp:  [12-18] 18 (09/13 0739) BP: (95-122)/(62-84) 103/79 (09/13 0739) SpO2:  [90 %-100 %] 94 % (09/13 0739) Weight:  [76.5 kg] 76.5 kg (09/12 1241)  Intake/Output from previous day: 09/12 0701 - 09/13 0700 In: 1003 [I.V.:1003] Out: 1800 [Urine:1650; Blood:150]  Labs: No results for input(s): WBC, RBC, HCT, PLT in the last 72 hours. No results for input(s): NA, K, CL, CO2, BUN, CREATININE, GLUCOSE, CALCIUM in the last 72 hours. No results for input(s): LABPT, INR in the last 72 hours.  Physical Exam: Neurologically intact ABD soft Intact pulses distally Incision: dressing C/D/I and no drainage Compartment soft Body mass index is 24.89 kg/m. HOB elevated - no headache No incontinence of bowel/ normal sensation saddle/peri-anal region  Assessment/Plan: Patient stable  xrays N/A Continue mobilization with physical therapy Continue care  Advance diet Up with therapy Plan for discharge tomorrow  Patient doing well overall.  Wound is clean dry intact no evidence of CSF leak. Denies any headaches or saddle dysesthesias to suggest cauda equina.  Will DC Foley now and monitor urine output. If he does well with physical therapy and mobilization most likely will discharge first thing in the morning.  Venita Lickahari Ferris Fielden, MD [facility] [phone]

## 2018-06-07 DIAGNOSIS — M48062 Spinal stenosis, lumbar region with neurogenic claudication: Secondary | ICD-10-CM | POA: Diagnosis not present

## 2018-06-07 LAB — CBC
HCT: 38.6 % — ABNORMAL LOW (ref 39.0–52.0)
Hemoglobin: 12.9 g/dL — ABNORMAL LOW (ref 13.0–17.0)
MCH: 31.4 pg (ref 26.0–34.0)
MCHC: 33.4 g/dL (ref 30.0–36.0)
MCV: 93.9 fL (ref 78.0–100.0)
Platelets: 130 10*3/uL — ABNORMAL LOW (ref 150–400)
RBC: 4.11 MIL/uL — ABNORMAL LOW (ref 4.22–5.81)
RDW: 12.5 % (ref 11.5–15.5)
WBC: 9.7 10*3/uL (ref 4.0–10.5)

## 2018-06-07 LAB — COMPREHENSIVE METABOLIC PANEL
ALT: 22 U/L (ref 0–44)
AST: 34 U/L (ref 15–41)
Albumin: 3 g/dL — ABNORMAL LOW (ref 3.5–5.0)
Alkaline Phosphatase: 39 U/L (ref 38–126)
Anion gap: 9 (ref 5–15)
BUN: 13 mg/dL (ref 6–20)
CO2: 23 mmol/L (ref 22–32)
Calcium: 7.8 mg/dL — ABNORMAL LOW (ref 8.9–10.3)
Chloride: 104 mmol/L (ref 98–111)
Creatinine, Ser: 0.88 mg/dL (ref 0.61–1.24)
GFR calc Af Amer: >60 mL/min (ref >60)
GFR calc non Af Amer: >60 mL/min (ref >60)
Glucose, Bld: 108 mg/dL — ABNORMAL HIGH (ref 70–99)
Potassium: 4 mmol/L (ref 3.5–5.1)
Sodium: 136 mmol/L (ref 135–145)
Total Bilirubin: 1.1 mg/dL (ref 0.3–1.2)
Total Protein: 5.2 g/dL — ABNORMAL LOW (ref 6.5–8.1)

## 2018-06-07 LAB — TSH: TSH: 4.104 u[IU]/mL (ref 0.350–4.500)

## 2018-06-07 LAB — GLUCOSE, CAPILLARY
Glucose-Capillary: 97 mg/dL (ref 70–99)
Glucose-Capillary: 173 mg/dL — ABNORMAL HIGH (ref 70–99)

## 2018-06-07 NOTE — Clinical Social Work Note (Signed)
CSW acknowledges SNF consult. PT recommending no follow up.  CSW signing off. Consult again if any social work needs arise.  Charlynn CourtSarah Avital Dancy, CSW [phone]

## 2018-06-07 NOTE — Progress Notes (Signed)
Dc instructions given to patient at this time.  Pt verbalized understanding of all instructions.  Pt has no c/o pain and no s/s of any acute distress.  Wife at bedside.

## 2018-06-07 NOTE — Progress Notes (Signed)
Theodore ReachKenneth L [name]  MRN: [id number] DOB/Age: 1965/10/15 52 y.o. [facility] Procedure: Procedure(s) (LRB): Lumbar decompression L4-S1 (N/A)     Subjective: Doing much better, walked in hall and stairs with PT. BP 86/? Reported by nursing when up with PT but he was not symptomatic. He has had no further syncopal episodes since yesterday. Patient very anxious to go home. Passing flatus  Vital Signs Temp:  [98 F (36.7 C)-99 F (37.2 C)] 98.5 F (36.9 C) (09/14 0845) Pulse Rate:  [79-91] 89 (09/14 0845) Resp:  [16-18] 16 (09/13 1913) BP: (68-128)/(42-80) 128/80 (09/14 0845) SpO2:  [92 %-97 %] 94 % (09/14 0845)  Lab Results Recent Labs    06/07/18 0906  WBC 9.7  HGB 12.9*  HCT 38.6*  PLT 130*   BMET No results for input(s): NA, K, CL, CO2, GLUCOSE, BUN, CREATININE, CALCIUM in the last 72 hours. INR  Date Value Ref Range Status  10/21/2007 1.1  Final     Exam Back dressing dry Abdomen soft NVI to LE        Plan Will order rolling walker today DC home later today  Ralene Batheracy Sokhna Christoph PA-C  06/07/2018, 10:12 AM Contact # [phone]

## 2018-06-07 NOTE — Progress Notes (Addendum)
Physical Therapy Treatment and D/C Patient Details Name: Theodore [name] MRN: [id number] DOB: 1966-06-22 Today's Date: 06/07/2018    History of Present Illness Pt is a 52 y.o. M with significant PMH of diabetes mellitus and pacemaker who is s/p lumbar decompression L4-S1. During surgery, pt had dural tear but no further CSF leak.    PT Comments    Pt admitted with above diagnosis. Pt currently with functional limitations due to balance and endurance deficits as well as pain. Pt was able to ambulate with RW with overall steady gait with some pain but manageable.  Pt BP was orthostatic but pt was not dizzy.  Initial BP was 128/75, 116/70 sitting, 98/80 standing, 125/99 walking,  Once back in room in chair 87/60.  Pt asymptomatic and prior to leaving room 100/75.  Nurse aware of fluctuating BP's.  Pt does well with RW and is ready to go home.  All education complete with pt and wife.  Will sign off as  anticipate d/c today.    Follow Up Recommendations  No PT follow up;Supervision for mobility/OOB     Equipment Recommendations  Rolling walker with 5" wheels    Recommendations for Other Services       Precautions / Restrictions Precautions Precautions: Fall;Back Precaution Booklet Issued: Yes (comment) Precaution Comments: verbally reviewed and provided written handout.  Required Braces or Orthoses: Spinal Brace Spinal Brace: Lumbar corset;Applied in sitting position Restrictions Weight Bearing Restrictions: No Other Position/Activity Restrictions: watch orthostatics    Mobility  Bed Mobility Overal bed mobility: Modified Independent             General bed mobility comments: increased time, cues for log roll technique  Transfers Overall transfer level: Needs assistance Equipment used: Rolling walker (2 wheeled) Transfers: Sit to/from Stand Sit to Stand: Min guard         General transfer comment: min guard for safety,increased  time  Ambulation/Gait Ambulation/Gait assistance: Min guard Gait Distance (Feet): 400 Feet Assistive device: Rolling walker (2 wheeled) Gait Pattern/deviations: Step-through pattern;Decreased stride length   Gait velocity interpretation: 1.31 - 2.62 ft/sec, indicative of limited community ambulator General Gait Details: Pt was able to ambulate with RW with good cadence overall.  Good safety.  Also walked without RW but feels safer and steadier with rW.    Stairs Stairs: Yes Stairs assistance: Supervision Stair Management: Two rails;Alternating pattern;Forwards Number of Stairs: 5 General stair comments: Pt can ascend and descend steps with rails without difficulty   Wheelchair Mobility    Modified Rankin (Stroke Patients Only)       Balance Overall balance assessment: No apparent balance deficits (not formally assessed)                                          Cognition Arousal/Alertness: Awake/alert Behavior During Therapy: WFL for tasks assessed/performed Overall Cognitive Status: Within Functional Limits for tasks assessed                                        Exercises      General Comments General comments (skin integrity, edema, etc.): wife present, pt and wife educated regarding donning and doffing brace      Pertinent Vitals/Pain Pain Assessment: Faces Faces Pain Scale: Hurts even more Pain Location: back and left leg  Pain Descriptors / Indicators: Guarding;Grimacing Pain Intervention(s): Limited activity within patient's tolerance;Monitored during session;Premedicated before session;Repositioned    Home Living                      Prior Function            PT Goals (current goals can now be found in the care plan section) Progress towards PT goals: Progressing toward goals    Frequency    Min 5X/week      PT Plan Current plan remains appropriate;Equipment recommendations need to be updated     Co-evaluation              AM-PAC PT "6 Clicks" Daily Activity  Outcome Measure  Difficulty turning over in bed (including adjusting bedclothes, sheets and blankets)?: None Difficulty moving from lying on back to sitting on the side of the bed? : None Difficulty sitting down on and standing up from a chair with arms (e.g., wheelchair, bedside commode, etc,.)?: None Help needed moving to and from a bed to chair (including a wheelchair)?: None Help needed walking in hospital room?: A Little Help needed climbing 3-5 steps with a railing? : A Little 6 Click Score: 22    End of Session Equipment Utilized During Treatment: Gait belt;Back brace Activity Tolerance: Patient limited by fatigue;Patient tolerated treatment well Patient left: with call bell/phone within [name];in chair;with chair alarm set;with family/visitor present Nurse Communication: Mobility status PT Visit Diagnosis: Pain;Difficulty in walking, not elsewhere classified (R26.2) Pain - part of body: (back)     Time: 4098-1191 PT Time Calculation (min) (ACUTE ONLY): 34 min  Charges:  $Gait Training: 23-37 mins                     Theodore [name],PT Acute Rehabilitation Services Pager:  [phone]  Office:  [phone]     Theodore [name] 06/07/2018, 12:00 PM

## 2018-06-07 NOTE — Progress Notes (Signed)
Received call from PT. Pt needs a RW. Met with pt. Referral made to [facility]. Contacted Reggie at [facility] for DME referral.

## 2018-06-09 ENCOUNTER — Telehealth: Payer: Self-pay | Admitting: Cardiology

## 2018-06-09 ENCOUNTER — Encounter (HOSPITAL_COMMUNITY): Payer: Self-pay | Admitting: Orthopedic Surgery

## 2018-06-09 ENCOUNTER — Encounter: Payer: BLUE CROSS/BLUE SHIELD | Admitting: *Deleted

## 2018-06-09 NOTE — Telephone Encounter (Signed)
Spoke with pt and reminded pt of remote transmission that is due today. Pt verbalized understanding.   

## 2018-06-10 ENCOUNTER — Encounter: Payer: Self-pay | Admitting: Cardiology

## 2018-06-10 NOTE — Progress Notes (Signed)
Letter  

## 2018-06-16 ENCOUNTER — Ambulatory Visit (INDEPENDENT_AMBULATORY_CARE_PROVIDER_SITE_OTHER): Payer: BLUE CROSS/BLUE SHIELD | Admitting: *Deleted

## 2018-06-16 DIAGNOSIS — I480 Paroxysmal atrial fibrillation: Secondary | ICD-10-CM | POA: Diagnosis not present

## 2018-06-16 NOTE — Progress Notes (Signed)
Remote pacemaker transmission.   

## 2018-06-17 ENCOUNTER — Encounter: Payer: Self-pay | Admitting: Cardiology

## 2018-06-17 NOTE — Discharge Summary (Signed)
Physician Discharge Summary  Patient ID: Theodore [name] MRN: [id number] DOB/AGE: Nov 01, 1965 52 y.o.  Admit date: 06/05/2018 Discharge date: 06/07/18  Admission Diagnoses:  Neurogenic orthostatic hypotension (HCC)  Discharge Diagnoses:  Principal Problem:   Neurogenic orthostatic hypotension (HCC) Active Problems:   Diabetes mellitus type 2, uncontrolled, without complications (HCC)   Sinoatrial bradycardia   Depression with anxiety   Neurocardiogenic syncope   Pacemaker   Paroxysmal atrial fibrillation (HCC)   Status post lumbar spine surgery for decompression of spinal cord   Past Medical History:  Diagnosis Date  . Anxiety   . Arthritis   . Bradycardia   . Depression   . Diabetes mellitus    dx 2010  . Pacemaker    Permanent pacemaker placement by Dr. Molly Maduro McQueen--10/21/2007--a [employer] Adapta--serial number #[id number]  . Syncope 09/2007   2D Echo--History of frequent episodes of syncope beginning at 52 years of age--usually associated with some type of vagal issue but he does completely lose consciousness    Surgeries: Procedure(s): Lumbar decompression L4-S1 on 06/05/2018   Consultants (if any): Treatment Team:  Loel Ro, MD Jerald Kief, MD  Discharged Condition: Improved  Hospital Course: Theodore [name] is an 52 y.o. male who was admitted 06/05/2018 with a diagnosis of  Lumbar spinal stenosis and went to the operating room on 06/05/2018 and underwent the above named procedures.  Post op day one pt reports a low level of pain on oral medications.  Pt has been on bed rest.  Foley is still in place.  Pt had orthostatic HTN when he got out of bed.Near syncope episodes.   Post op day 2 pt is having resolution of orthostatic HTN.  Low level of pain.  Rolling walker recommended and ordered. Pt is urinating with out difficulty.   He was given perioperative antibiotics:  Anti-infectives (From admission, onward)   Start     Dose/Rate Route Frequency Ordered  Stop   06/05/18 2215  clindamycin (CLEOCIN) IVPB 900 mg  Status:  Discontinued     900 mg 100 mL/hr over 30 Minutes Intravenous Every 12 hours 06/05/18 2204 06/07/18 1657   06/05/18 1515  clindamycin (CLEOCIN) IVPB 900 mg     900 mg 100 mL/hr over 30 Minutes Intravenous To ShortStay Surgical 06/05/18 1506 06/05/18 1535   06/05/18 1210  vancomycin (VANCOCIN) IVPB 1000 mg/200 mL premix     1,000 mg 200 mL/hr over 60 Minutes Intravenous 60 min pre-op 06/05/18 1210 06/06/18 0834    .  He was given sequential compression devices, early ambulation, and TED for DVT prophylaxis.  He benefited maximally from the hospital stay and there were no complications.    Recent vital signs:  Vitals:   06/07/18 0845 06/07/18 1214  BP: 128/80 109/79  Pulse: 89 89  Resp:    Temp: 98.5 F (36.9 C) 98.6 F (37 C)  SpO2: 94%     Recent laboratory studies:  Lab Results  Component Value Date   HGB 12.9 (L) 06/07/2018   HGB 16.2 05/30/2018   HGB 14.6 03/12/2016   Lab Results  Component Value Date   WBC 9.7 06/07/2018   PLT 130 (L) 06/07/2018   Lab Results  Component Value Date   INR 1.1 10/21/2007   Lab Results  Component Value Date   NA 136 06/07/2018   K 4.0 06/07/2018   CL 104 06/07/2018   CO2 23 06/07/2018   BUN 13 06/07/2018   CREATININE 0.88 06/07/2018   GLUCOSE 108 (H)  06/07/2018    Discharge Medications:   Allergies as of 06/07/2018      Reactions   Cephalexin Hives   Vancomycin Hives, Itching, Other (See Comments)   True Allergic Reaction -- Not "Red Man's Syndrome Onset of prominent red rash, severe itching/hives Not responsive to Benadryl and IV rate reduction. First seen pre-op for surgery on 06/05/18.      Medication List    STOP taking these medications   aspirin EC 81 MG tablet   indomethacin 25 MG capsule Commonly known as:  INDOCIN     TAKE these medications   citalopram 20 MG tablet Commonly known as:  CELEXA Take 1 tablet (20 mg total) by mouth  daily.   fenofibrate 160 MG tablet Take 160 mg by mouth daily.   gabapentin 300 MG capsule Commonly known as:  NEURONTIN Take 300 mg by mouth every morning.   glimepiride 4 MG tablet Commonly known as:  AMARYL Take 4 mg by mouth daily.   JARDIANCE 10 MG Tabs tablet Generic drug:  empagliflozin Take 10 mg by mouth every morning.   lisinopril 5 MG tablet Commonly known as:  PRINIVIL,ZESTRIL Take 5 mg by mouth daily.   methocarbamol 500 MG tablet Commonly known as:  ROBAXIN Take 1 tablet (500 mg total) by mouth 3 (three) times daily.   NOVOLIN R 100 units/mL injection Generic drug:  insulin regular Inject 6 Units into the skin daily. At 3 pm   ondansetron 4 MG disintegrating tablet Commonly known as:  ZOFRAN-ODT Take 1 tablet (4 mg total) by mouth every 8 (eight) hours as needed for nausea or vomiting.   TRESIBA FLEXTOUCH 100 UNIT/ML Sopn FlexTouch Pen Generic drug:  insulin degludec Inject 25 Units into the skin at bedtime.     ASK your doctor about these medications   oxyCODONE-acetaminophen 10-325 MG tablet Commonly known as:  PERCOCET Take 1 tablet by mouth every 4 (four) hours as needed for up to 5 days for pain. Ask about: Should I take this medication?       Diagnostic Studies: Dg Lumbar Spine 1 View  Result Date: 06/05/2018 CLINICAL DATA:  Localization image for Lumbar Decompression L4-S1 for Lumbar Spinal Stenosis with Neurogenic Claudication. EXAM: LUMBAR SPINE - 1 VIEW COMPARISON:  None. FINDINGS: Intraoperative cross-table lateral film of the lumbar spine is provided. Surgical overlies the posterior elements at the L3-4 disc space level. Additional surgical probe is directed towards the posterior elements at the L5 vertebral body level. IMPRESSION: 1. Surgical probe overlying the posterior elements at the L3-4 disc space level. 2. Additional surgical probe overlying the soft tissues posterior to the posterior elements at the L5 level. Electronically Signed    By: Bary Richard M.D.   On: 06/05/2018 20:44   Dg Spine Portable 1 View  Result Date: 06/05/2018 CLINICAL DATA:  Localization L4-S1 decompression for lumbar spinal stenosis with neurogenic claudication. EXAM: PORTABLE SPINE - 1 VIEW COMPARISON:  CT lumbar myelogram dated 05/02/2018 FINDINGS: Intraoperative lateral view of the lumbosacral spine. Based on the numbering on the CT myelogram, surgical probe overlies the posterior elements at the level of the L5-S1 disc space. IMPRESSION: Surgical probe overlying the posterior elements at the level of the L5-S1 disc space. Electronically Signed   By: Bary Richard M.D.   On: 06/05/2018 15:45    Disposition:  Pt will present to clinic in 2 weeks  Post op meds provided  Discharge Instructions    Incentive spirometry RT   Complete by:  As directed       Follow-up Information    Venita LickBrooks, Dahari, MD Follow up in 2 week(s).   Specialty:  Orthopedic Surgery Contact information: [address] [place] [address] [phone]            Signed: Kirt BoysMayo, Tilda Samudio Christina 06/17/2018, 12:50 PM

## 2018-07-05 LAB — CUP PACEART REMOTE DEVICE CHECK
Battery Impedance: 1129 Ohm
Battery Remaining Longevity: 63 mo
Battery Voltage: 2.78 V
Brady Statistic AP VP Percent: 0 %
Brady Statistic AP VS Percent: 8 %
Brady Statistic AS VP Percent: 0 %
Brady Statistic AS VS Percent: 92 %
Date Time Interrogation Session: 20190922001617
Implantable Lead Implant Date: 20090127
Implantable Lead Implant Date: 20090127
Implantable Lead Location: 753859
Implantable Lead Location: 753860
Implantable Lead Model: 5076
Implantable Lead Model: 5076
Implantable Pulse Generator Implant Date: 20090127
Lead Channel Impedance Value: 429 Ohm
Lead Channel Impedance Value: 531 Ohm
Lead Channel Pacing Threshold Amplitude: 0.5 V
Lead Channel Pacing Threshold Amplitude: 0.75 V
Lead Channel Pacing Threshold Pulse Width: 0.4 ms
Lead Channel Pacing Threshold Pulse Width: 0.4 ms
Lead Channel Setting Pacing Amplitude: 2 V
Lead Channel Setting Pacing Amplitude: 2.5 V
Lead Channel Setting Pacing Pulse Width: 0.4 ms
Lead Channel Setting Sensing Sensitivity: 5.6 mV

## 2018-08-28 ENCOUNTER — Ambulatory Visit: Payer: BLUE CROSS/BLUE SHIELD | Admitting: Dietician

## 2018-09-19 ENCOUNTER — Encounter: Payer: Self-pay | Admitting: Cardiology

## 2018-09-22 ENCOUNTER — Ambulatory Visit (INDEPENDENT_AMBULATORY_CARE_PROVIDER_SITE_OTHER): Payer: BLUE CROSS/BLUE SHIELD

## 2018-09-22 DIAGNOSIS — I480 Paroxysmal atrial fibrillation: Secondary | ICD-10-CM | POA: Diagnosis not present

## 2018-09-22 NOTE — Progress Notes (Signed)
Remote pacemaker transmission.   

## 2018-09-23 LAB — CUP PACEART REMOTE DEVICE CHECK
Battery Impedance: 1181 Ohm
Battery Remaining Longevity: 61 mo
Battery Voltage: 2.77 V
Brady Statistic AP VP Percent: 0 %
Brady Statistic AP VS Percent: 8 %
Brady Statistic AS VP Percent: 0 %
Brady Statistic AS VS Percent: 92 %
Date Time Interrogation Session: 20191229233245
Implantable Lead Implant Date: 20090127
Implantable Lead Implant Date: 20090127
Implantable Lead Location: 753859
Implantable Lead Location: 753860
Implantable Lead Model: 5076
Implantable Lead Model: 5076
Implantable Pulse Generator Implant Date: 20090127
Lead Channel Impedance Value: 466 Ohm
Lead Channel Impedance Value: 580 Ohm
Lead Channel Pacing Threshold Amplitude: 0.625 V
Lead Channel Pacing Threshold Amplitude: 0.625 V
Lead Channel Pacing Threshold Pulse Width: 0.4 ms
Lead Channel Pacing Threshold Pulse Width: 0.4 ms
Lead Channel Setting Pacing Amplitude: 2 V
Lead Channel Setting Pacing Amplitude: 2.5 V
Lead Channel Setting Pacing Pulse Width: 0.4 ms
Lead Channel Setting Sensing Sensitivity: 5.6 mV

## 2018-10-06 ENCOUNTER — Encounter: Payer: Self-pay | Admitting: Family Medicine

## 2018-10-06 ENCOUNTER — Ambulatory Visit (INDEPENDENT_AMBULATORY_CARE_PROVIDER_SITE_OTHER): Payer: BLUE CROSS/BLUE SHIELD | Admitting: Family Medicine

## 2018-10-06 VITALS — BP 144/92 | HR 92 | Resp 17 | Ht 68.0 in | Wt 180.4 lb

## 2018-10-06 DIAGNOSIS — Z1389 Encounter for screening for other disorder: Secondary | ICD-10-CM | POA: Diagnosis not present

## 2018-10-06 DIAGNOSIS — I10 Essential (primary) hypertension: Secondary | ICD-10-CM | POA: Diagnosis not present

## 2018-10-06 DIAGNOSIS — R51 Headache: Secondary | ICD-10-CM | POA: Diagnosis not present

## 2018-10-06 DIAGNOSIS — Z7689 Persons encountering health services in other specified circumstances: Secondary | ICD-10-CM

## 2018-10-06 DIAGNOSIS — Z794 Long term (current) use of insulin: Secondary | ICD-10-CM | POA: Diagnosis not present

## 2018-10-06 DIAGNOSIS — E1159 Type 2 diabetes mellitus with other circulatory complications: Secondary | ICD-10-CM

## 2018-10-06 DIAGNOSIS — R519 Headache, unspecified: Secondary | ICD-10-CM

## 2018-10-06 DIAGNOSIS — E1165 Type 2 diabetes mellitus with hyperglycemia: Secondary | ICD-10-CM

## 2018-10-06 DIAGNOSIS — R739 Hyperglycemia, unspecified: Secondary | ICD-10-CM

## 2018-10-06 LAB — POCT URINALYSIS DIP (CLINITEK)
Bilirubin, UA: NEGATIVE
Blood, UA: NEGATIVE
Glucose, UA: 500 mg/dL — AB
Ketones, POC UA: NEGATIVE mg/dL
Leukocytes, UA: NEGATIVE
Nitrite, UA: NEGATIVE
POC PROTEIN,UA: NEGATIVE
Spec Grav, UA: 1.01 (ref 1.010–1.025)
Urobilinogen, UA: 0.2 E.U./dL
pH, UA: 6 (ref 5.0–8.0)

## 2018-10-06 LAB — GLUCOSE, POCT (MANUAL RESULT ENTRY): POC Glucose: 441 mg/dl — AB (ref 70–99)

## 2018-10-06 MED ORDER — CITALOPRAM HYDROBROMIDE 20 MG PO TABS: 20.0000 mg | ORAL_TABLET | Freq: Every day | ORAL | 11 refills | Status: DC

## 2018-10-06 MED ORDER — BASAGLAR KWIKPEN 100 UNIT/ML ~~LOC~~ SOPN: 50.0000 [IU] | PEN_INJECTOR | Freq: Every day | SUBCUTANEOUS | 1 refills | Status: DC

## 2018-10-06 MED ORDER — IPRATROPIUM BROMIDE 0.03 % NA SOLN: 2.0000 | Freq: Three times a day (TID) | NASAL | 0 refills | Status: DC

## 2018-10-06 MED ORDER — GABAPENTIN 300 MG PO CAPS: 300.0000 mg | ORAL_CAPSULE | Freq: Two times a day (BID) | ORAL | 2 refills | Status: DC

## 2018-10-06 MED ORDER — KETOROLAC TROMETHAMINE 30 MG/ML IJ SOLN
30.0000 mg | Freq: Once | INTRAMUSCULAR | Status: AC
  Administered 2018-10-06: 30 mg via INTRAMUSCULAR

## 2018-10-06 MED ORDER — INSULIN PEN NEEDLE 31G X 5 MM MISC: 1 refills | Status: DC

## 2018-10-06 MED ORDER — LEVOCETIRIZINE DIHYDROCHLORIDE 5 MG PO TABS: 5.0000 mg | ORAL_TABLET | Freq: Every evening | ORAL | 1 refills | Status: DC

## 2018-10-06 MED ORDER — METFORMIN HCL 1000 MG PO TABS: 1000.0000 mg | ORAL_TABLET | Freq: Two times a day (BID) | ORAL | 3 refills | Status: DC

## 2018-10-06 MED ORDER — LISINOPRIL 5 MG PO TABS: 5.0000 mg | ORAL_TABLET | Freq: Every day | ORAL | 2 refills | Status: DC

## 2018-10-06 MED ORDER — INSULIN ASPART 100 UNIT/ML ~~LOC~~ SOLN
20.0000 [IU] | Freq: Once | SUBCUTANEOUS | Status: AC
  Administered 2018-10-06: 20 [IU] via SUBCUTANEOUS

## 2018-10-06 NOTE — Progress Notes (Signed)
Theodore [name], is a 53 y.o. male  VEL:[id number]  CHE:[id number]  DOB - 12/04/1965  CC:  Chief Complaint  Patient presents with  . Establish Care  . Diabetes       HPI: Theodore [name] is a 53 y.o. male is here today to establish care.   Theodore [name] has Diabetes mellitus type 2, uncontrolled, without complications (HCC); Sinoatrial bradycardia; Depression with anxiety; Neurocardiogenic syncope; Pacemaker; Tachycardia; Paroxysmal atrial fibrillation (HCC); Diabetes mellitus type 2 in nonobese [facility]); Status post lumbar spine surgery for decompression of spinal cord; and Neurogenic orthostatic hypotension (HCC) on their problem list.    Diabetes  Theodore [name]  monitors glucose at home. Over the last few days, his readings have read "high" or in the upper 300-400's. He was previously followed by [facility] and reports he has been unable to obtain refills. Currently regimen consists of Lantus 30 units twice daily (increase his dose from once daily to twice daily) and short acting insulin which he is administering according to his sister prescription.  He endorses episodes of hypoglycemia.  His blood sugar dropped to 40 a few days ago and has been less than 70 upon awakening since self adjusting insulin medication.  He endorses polyuria, polydipsia, he has had some blurred vision although not experiencing visual problems today.  Current blood sugar on arrival today 441.  He has not doses of insulin today.  He is uncertain of what his last A1c.  He is an active routine physical activity as he is recently had spinal surgery. Body mass index is 27.43 kg/m. History of sinoatrial bradycardia and has a pacemake since 2009.   Hypertension  Theodore [name] reports no home monitoring of blood pressure. Blood pressure elevated today. He was previously prescribed lisinopril and prior provided stopped medication.  He endorses headaches although contributes that to sinus congestion.  He denies chest  pain, dizziness, edema, shortness of breath or new weakness.  He is not compliant with a low-sodium diet.  Current medications: Current Outpatient Medications:  .  citalopram (CELEXA) 20 MG tablet, Take 1 tablet (20 mg total) by mouth daily., Disp: 30 tablet, Rfl: 11 .  gabapentin (NEURONTIN) 300 MG capsule, Take 300 mg by mouth every morning., Disp: , Rfl:  .  indomethacin (INDOCIN) 25 MG capsule, Take 25 mg by mouth 2 (two) times daily with a meal., Disp: , Rfl:  .  Insulin Glargine (BASAGLAR KWIKPEN) 100 UNIT/ML SOPN, Inject 30 Units into the skin 2 (two) times daily., Disp: , Rfl:  .  Insulin Pen Needle (B-D UF III MINI PEN NEEDLES) 31G X 5 MM MISC, BD Ultra-Fine Short Pen Needle 31 gauge x 5/16"  FOR USE WITH INJECTION OF TRESIBA AND NOVOLOG, Disp: , Rfl:  .  NOVOLIN R 100 UNIT/ML injection, Inject into the skin daily. Sliding scale, Disp: , Rfl:  .  ondansetron (ZOFRAN ODT) 4 MG disintegrating tablet, Take 1 tablet (4 mg total) by mouth every 8 (eight) hours as needed for nausea or vomiting., Disp: 20 tablet, Rfl: 0 .  JARDIANCE 10 MG TABS tablet, Take 10 mg by mouth every morning. , Disp: , Rfl: 2   Pertinent family medical history: family history includes Coronary artery disease (age of onset: 60's) in his mother; Diabetes in his mother and sister; Hypertension in his mother.   Allergies  Allergen Reactions  . Cephalexin Hives  . Vancomycin Hives, Itching and Other (See Comments)    True Allergic Reaction -- Not "Red Man's  Syndrome Onset of prominent red rash, severe itching/hives Not responsive to Benadryl and IV rate reduction. First seen pre-op for surgery on 06/05/18.    Social History   Socioeconomic History  . Marital status: Married    Spouse name: Not on file  . Number of children: Not on file  . Years of education: Not on file  . Highest education level: Not on file  Occupational History  . Not on file  Social Needs  . Financial resource strain: Not on file  .  Food insecurity:    Worry: Not on file    Inability: Not on file  . Transportation needs:    Medical: Not on file    Non-medical: Not on file  Tobacco Use  . Smoking status: Former Smoker    Last attempt to quit: 10/17/2007    Years since quitting: 10.9  . Smokeless tobacco: Former Engineer, waterUser  Substance and Sexual Activity  . Alcohol use: No  . Drug use: No  . Sexual activity: Not on file  Lifestyle  . Physical activity:    Days per week: Not on file    Minutes per session: Not on file  . Stress: Not on file  Relationships  . Social connections:    Talks on phone: Not on file    Gets together: Not on file    Attends religious service: Not on file    Active member of club or organization: Not on file    Attends meetings of clubs or organizations: Not on file    Relationship status: Not on file  . Intimate partner violence:    Fear of current or ex partner: Not on file    Emotionally abused: Not on file    Physically abused: Not on file    Forced sexual activity: Not on file  Other Topics Concern  . Not on file  Social History Narrative  . Not on file    Review of Systems: Pertinent negatives listed in HPI Objective:   Vitals:   10/06/18 1431  BP: (!) 144/92  Pulse: 92  Resp: 17  SpO2: 95%    BP Readings from Last 3 Encounters:  10/06/18 (!) 144/92  06/07/18 109/79  05/30/18 (!) 130/95    Filed Weights   10/06/18 1431  Weight: 180 lb 6.4 oz (81.8 kg)      Physical Exam  Constitutional: He is oriented to person, place, and time and well-developed, well-nourished, and in no distress. No distress.  HENT:  Head: Normocephalic and atraumatic.  Right Ear: Hearing, tympanic membrane, external ear and ear canal normal.  Left Ear: Hearing, tympanic membrane, external ear and ear canal normal.  Nose: Mucosal edema and rhinorrhea present. Right sinus exhibits frontal sinus tenderness.  Mouth/Throat: Uvula is midline and oropharynx is clear and moist. No oropharyngeal  exudate.  Cardiovascular: Normal rate, regular rhythm, normal heart sounds and intact distal pulses.  Pulmonary/Chest: Effort normal and breath sounds normal.  Abdominal: Soft. Bowel sounds are normal. He exhibits no distension. There is no abdominal tenderness.  Musculoskeletal: Normal range of motion.  Neurological: He is alert and oriented to person, place, and time. Gait normal. GCS score is 15.  Skin: Skin is warm and dry.  Psychiatric: Affect and judgment normal.  ab Results (prior encounters)  Lab Results  Component Value Date   WBC 9.7 06/07/2018   HGB 12.9 (L) 06/07/2018   HCT 38.6 (L) 06/07/2018   MCV 93.9 06/07/2018   PLT 130 (L) 06/07/2018  Lab Results  Component Value Date   CREATININE 0.88 06/07/2018   BUN 13 06/07/2018   NA 136 06/07/2018   K 4.0 06/07/2018   CL 104 06/07/2018   CO2 23 06/07/2018    Lab Results  Component Value Date   HGBA1C 9.1 (H) 05/30/2018       Component Value Date/Time   CHOL 138 10/22/2011 0507   TRIG 359 (H) 10/22/2011 0507   HDL 26 (L) 10/22/2011 0507   CHOLHDL 5.3 10/22/2011 0507   VLDL 72 (H) 10/22/2011 0507   LDLCALC 40 10/22/2011 0507        Assessment and plan:  1. Encounter to establish care 2. Type 2 diabetes mellitus with other circulatory complication, with long-term current use of insulin (HCC) Glucose elevated today. Given 20 units of Novolog here in office. Urine negative for ketones.  Checking an A1C today. Last A1C available via EMR 9.1x 4 months ago. Plan today: Start Metformin 1000 mg twice daily (1st week start 500 mg twice daily then increase 1,000 twice daily). Increase Basaglar 50 units once daily Sliding Scale: 125-180=0 units 201-250 administer= 3 units 251-300 administer =6 units 301 or more 10 units Aim for 30 minutes of exercise most days, with a goal of 150 minutes per week. -Glucose monitoring at minimal of twice daily and keep a log of readings. -Commit to medication adherence and  self-adjustment as needed -increase foods containing whole grains (one-half of grain intake). -saturated fat intake should be reduced -reduce intake of trans fat (lowers LDL cholesterol and increases HDL cholesterol) -Eat 4-5 small meals during the day to reduce the risk of becoming hungry. Patient will follow-up with insurance provider to see if plan covers Victoza or Trulicity as either would be a good option to lower A1c.   3. Essential hypertension, uncontrolled today Resume lisinopril We have discussed target BP range and blood pressure goal. I have advised patient to check BP regularly and to call us back or report to clinic if the numbers are consistently higher than 140/90. We discussed the importance of compliance with medical therapy and DASH diet recommended, consequences of uncontrolled hypertension discussed.  Return in 1 week for blood pressure check. Checking: - TSH - Comprehensive metabolic panel  4. Hyperglycemia - insulin aspart (novoLOG) injection 20 Units   5. Sinus headache - ketorolac (TORADOL) 30 MG/ML injection 30 mg given for headache. Start levocetirizine 5 mg once daily at bedtime Atrovent nasal spray 3 times daily   Meds ordered this encounter  Medications  . ketorolac (TORADOL) 30 MG/ML injection 30 mg  . insulin aspart (novoLOG) injection 20 Units  . lisinopril (PRINIVIL,ZESTRIL) 5 MG tablet    Sig: Take 1 tablet (5 mg total) by mouth daily.    Dispense:  90 tablet    Refill:  2  . citalopram (CELEXA) 20 MG tablet    Sig: Take 1 tablet (20 mg total) by mouth daily.    Dispense:  30 tablet    Refill:  11  . gabapentin (NEURONTIN) 300 MG capsule    Sig: Take 1 capsule (300 mg total) by mouth 2 (two) times daily.    Dispense:  60 capsule    Refill:  2  . Insulin Glargine (BASAGLAR KWIKPEN) 100 UNIT/ML SOPN    Sig: Inject 0.5 mLs (50 Units total) into the skin daily.    Dispense:  15 pen    Refill:  1  . Insulin Pen Needle (B-D UF III MINI PEN  NEEDLES) 31G  X 5 MM MISC    Sig: BD Ultra-Fine Short Pen Needle 31 gauge x 5/16"  FOR USE WITH INJECTION OF TRESIBA AND NOVOLOG    Dispense:  200 each    Refill:  1  . metFORMIN (GLUCOPHAGE) 1000 MG tablet    Sig: Take 1 tablet (1,000 mg total) by mouth 2 (two) times daily with a meal.    Dispense:  180 tablet    Refill:  3  . levocetirizine (XYZAL) 5 MG tablet    Sig: Take 1 tablet (5 mg total) by mouth every evening.    Dispense:  30 tablet    Refill:  1  . ipratropium (ATROVENT) 0.03 % nasal spray    Sig: Place 2 sprays into both nostrils 3 (three) times daily.    Dispense:  30 mL    Refill:  0    Orders Placed This Encounter  Procedures  . Hemoglobin A1c  . TSH  . Comprehensive metabolic panel  . Glucose (CBG)  . POCT URINALYSIS DIP (CLINITEK)   A total of 40 minutes spent, greater than 50 % of this time was spent counseling, obtaining medical history, monitoring acute hyperglycemia, and coordination of care.    Return in about 1 week (around 10/13/2018) for diabetes .   The patient was given clear instructions to go to ER or return to medical center if symptoms don't improve, worsen or new problems develop. The patient verbalized understanding. The patient was advised  to call and obtain lab results if they haven't heard anything from out office within 7-10 business days.  Joaquin CourtsKimberly Shyana Kulakowski, FNP [facility] [address], [place] [id number] [phone]: [phone]    This note has been created with Dragon speech recognition software and Paediatric nursesmart phrase technology. Any transcriptional errors are unintentional.

## 2018-10-06 NOTE — Patient Instructions (Addendum)
[facility]    Theodore ReachKenneth L [name] was seen by Joaquin CourtsKimberly Harris, FNP today.   Theodore GunningKenneth L [name]'s primary care provider is Bing NeighborsHarris, Kimberly S, FNP.   For the best care possible, you should try to see Joaquin CourtsKimberly Harris, FNP-C whenever you come to the clinic.   We look forward to seeing you again soon!  If you have any questions about your visit today, please call us at [phone] or feel free to [name] your primary care provider via MyChart.      I have increased her Basaglar to 50 units once daily. I am starting you on metformin 500 mg twice daily x1 week then take as prescribed 1000 mg twice daily. You will administer your Novolin fast acting insulin per scale below as needed: Administer per SS into skin 0-8 units Up to  3 times per before meals as needed  125-180=0 units 201-250 administer= 3 units 251-300 administer =6 units 301 or more 10 units  Send lisinopril 5 mg once daily.  Contact insurance to see if they will cover either Victoza or Trulicity for better improvement in management of diabetes.    Diabetes Mellitus and Nutrition, Adult When you have diabetes (diabetes mellitus), it is very important to have healthy eating habits because your blood sugar (glucose) levels are greatly affected by what you eat and drink. Eating healthy foods in the appropriate amounts, at about the same times every day, can help you:  Control your blood glucose.  Lower your risk of heart disease.  Improve your blood pressure.  [name] or maintain a healthy weight. Every person with diabetes is different, and each person has different needs for a meal plan. Your health care provider may recommend that you work with a diet and nutrition specialist (dietitian) to make a meal plan that is best for you. Your meal plan may vary depending on factors such as:  The calories you need.  The medicines you take.  Your weight.  Your blood  glucose, blood pressure, and cholesterol levels.  Your activity level.  Other health conditions you have, such as heart or kidney disease. How do carbohydrates affect me? Carbohydrates, also called carbs, affect your blood glucose level more than any other type of food. Eating carbs naturally raises the amount of glucose in your blood. Carb counting is a method for keeping track of how many carbs you eat. Counting carbs is important to keep your blood glucose at a healthy level, especially if you use insulin or take certain oral diabetes medicines. It is important to know how many carbs you can safely have in each meal. This is different for every person. Your dietitian can help you calculate how many carbs you should have at each meal and for each snack. Foods that contain carbs include:  Bread, cereal, rice, pasta, and crackers.  Potatoes and corn.  Peas, beans, and lentils.  Milk and yogurt.  Fruit and juice.  Desserts, such as cakes, cookies, ice cream, and candy. How does alcohol affect me? Alcohol can cause a sudden decrease in blood glucose (hypoglycemia), especially if you use insulin or take certain oral diabetes medicines. Hypoglycemia can be a life-threatening condition. Symptoms of hypoglycemia (sleepiness, dizziness, and confusion) are similar to symptoms of having too much alcohol. If your health care provider says that alcohol is safe for you, follow these guidelines:  Limit alcohol intake to no more than 1 drink per day for  nonpregnant women and 2 drinks per day for men. One drink equals 12 oz of beer, 5 oz of wine, or 1 oz of hard liquor.  Do not drink on an empty stomach.  Keep yourself hydrated with water, diet soda, or unsweetened iced tea.  Keep in mind that regular soda, juice, and other mixers may contain a lot of sugar and must be counted as carbs. What are tips for following this plan?  Reading food labels  Start by checking the serving size on the  "Nutrition Facts" label of packaged foods and drinks. The amount of calories, carbs, fats, and other nutrients listed on the label is based on one serving of the item. Many items contain more than one serving per package.  Check the total grams (g) of carbs in one serving. You can calculate the number of servings of carbs in one serving by dividing the total carbs by 15. For example, if a food has 30 g of total carbs, it would be equal to 2 servings of carbs.  Check the number of grams (g) of saturated and trans fats in one serving. Choose foods that have low or no amount of these fats.  Check the number of milligrams (mg) of salt (sodium) in one serving. Most people should limit total sodium intake to less than 2,300 mg per day.  Always check the nutrition information of foods labeled as "low-fat" or "nonfat". These foods may be higher in added sugar or refined carbs and should be avoided.  Talk to your dietitian to identify your daily goals for nutrients listed on the label. Shopping  Avoid buying canned, premade, or processed foods. These foods tend to be high in fat, sodium, and added sugar.  Shop around the outside edge of the grocery store. This includes fresh fruits and vegetables, bulk grains, fresh meats, and fresh dairy. Cooking  Use low-heat cooking methods, such as baking, instead of high-heat cooking methods like deep frying.  Cook using healthy oils, such as olive, canola, or sunflower oil.  Avoid cooking with butter, cream, or high-fat meats. Meal planning  Eat meals and snacks regularly, preferably at the same times every day. Avoid going long periods of time without eating.  Eat foods high in fiber, such as fresh fruits, vegetables, beans, and whole grains. Talk to your dietitian about how many servings of carbs you can eat at each meal.  Eat 4-6 ounces (oz) of lean protein each day, such as lean meat, chicken, fish, eggs, or tofu. One oz of lean protein is equal  to: ? 1 oz of meat, chicken, or fish. ? 1 egg. ?  cup of tofu.  Eat some foods each day that contain healthy fats, such as avocado, nuts, seeds, and fish. Lifestyle  Check your blood glucose regularly.  Exercise regularly as told by your health care provider. This may include: ? 150 minutes of moderate-intensity or vigorous-intensity exercise each week. This could be brisk walking, biking, or water aerobics. ? Stretching and doing strength exercises, such as yoga or weightlifting, at least 2 times a week.  Take medicines as told by your health care provider.  Do not use any products that contain nicotine or tobacco, such as cigarettes and e-cigarettes. If you need help quitting, ask your health care provider.  Work with a Social worker or diabetes educator to identify strategies to manage stress and any emotional and social challenges. Questions to ask a health care provider  Do I need to meet with a diabetes  educator?  Do I need to meet with a dietitian?  What number can I call if I have questions?  When are the best times to check my blood glucose? Where to find more information:  American Diabetes Association: diabetes.org  Academy of Nutrition and Dietetics: www.eatright.[employer] of Diabetes and Digestive and Kidney Diseases (NIH): CarFlippers.tn Summary  A healthy meal plan will help you control your blood glucose and maintain a healthy lifestyle.  Working with a diet and nutrition specialist (dietitian) can help you make a meal plan that is best for you.  Keep in mind that carbohydrates (carbs) and alcohol have immediate effects on your blood glucose levels. It is important to count carbs and to use alcohol carefully. This information is not intended to replace advice given to you by your health care provider. Make sure you discuss any questions you have with your health care provider. Document Released: 06/07/2005 Document Revised: 04/10/2017 Document  Reviewed: 10/15/2016 Elsevier Interactive Patient Education  2019 [employer].

## 2018-10-07 LAB — COMPREHENSIVE METABOLIC PANEL
ALT: 40 IU/L (ref 0–44)
AST: 48 IU/L — ABNORMAL HIGH (ref 0–40)
Albumin/Globulin Ratio: 1.3 (ref 1.2–2.2)
Albumin: 3.4 g/dL — ABNORMAL LOW (ref 3.5–5.5)
Alkaline Phosphatase: 77 IU/L (ref 39–117)
BUN/Creatinine Ratio: 23 — ABNORMAL HIGH (ref 9–20)
BUN: 18 mg/dL (ref 6–24)
Bilirubin Total: 0.2 mg/dL (ref 0.0–1.2)
CO2: 22 mmol/L (ref 20–29)
Calcium: 8.7 mg/dL (ref 8.7–10.2)
Chloride: 101 mmol/L (ref 96–106)
Creatinine, Ser: 0.79 mg/dL (ref 0.76–1.27)
GFR calc Af Amer: 119 mL/min/{1.73_m2} (ref >59)
GFR calc non Af Amer: 103 mL/min/{1.73_m2} (ref >59)
Globulin, Total: 2.6 g/dL (ref 1.5–4.5)
Glucose: 379 mg/dL — ABNORMAL HIGH (ref 65–99)
Potassium: 4.3 mmol/L (ref 3.5–5.2)
Sodium: 136 mmol/L (ref 134–144)
Total Protein: 6 g/dL (ref 6.0–8.5)

## 2018-10-07 LAB — HEMOGLOBIN A1C
Est. average glucose Bld gHb Est-mCnc: 220 mg/dL
Hgb A1c MFr Bld: 9.3 % — ABNORMAL HIGH (ref 4.8–5.6)

## 2018-10-07 LAB — TSH: TSH: 4.1 u[IU]/mL (ref 0.450–4.500)

## 2018-10-14 ENCOUNTER — Ambulatory Visit (INDEPENDENT_AMBULATORY_CARE_PROVIDER_SITE_OTHER): Payer: BLUE CROSS/BLUE SHIELD | Admitting: Family Medicine

## 2018-10-14 ENCOUNTER — Encounter: Payer: Self-pay | Admitting: Family Medicine

## 2018-10-14 VITALS — BP 142/94 | HR 86 | Resp 17 | Ht 68.0 in | Wt 179.0 lb

## 2018-10-14 DIAGNOSIS — E1165 Type 2 diabetes mellitus with hyperglycemia: Secondary | ICD-10-CM | POA: Diagnosis not present

## 2018-10-14 DIAGNOSIS — IMO0001 Reserved for inherently not codable concepts without codable children: Secondary | ICD-10-CM

## 2018-10-14 DIAGNOSIS — I10 Essential (primary) hypertension: Secondary | ICD-10-CM | POA: Diagnosis not present

## 2018-10-14 DIAGNOSIS — Z23 Encounter for immunization: Secondary | ICD-10-CM

## 2018-10-14 LAB — GLUCOSE, POCT (MANUAL RESULT ENTRY)
POC Glucose: 68 mg/dl — AB (ref 70–99)
POC Glucose: 98 mg/dl (ref 70–99)

## 2018-10-14 MED ORDER — BASAGLAR KWIKPEN 100 UNIT/ML ~~LOC~~ SOPN: 40.0000 [IU] | PEN_INJECTOR | Freq: Every day | SUBCUTANEOUS | 1 refills | Status: DC

## 2018-10-14 NOTE — Progress Notes (Signed)
Patient notified of results & recommendations while being worked up for office visit today. Expressed understanding.

## 2018-10-14 NOTE — Patient Instructions (Addendum)
Please check your insurance will cover Victoza or Trulicity.   Continue Lisinopril 5 mg.   Administer 40 units of Baslagar in morning with food. Check blood sugar twice daily in the morning and prior to bedtime.    Contact cardiology to verify whether or not you should continue aspirin.   Reduce intake of sandwiches and substitute one meal with salad and include a vegetable with dinner. Can vegetables are ok, try and select low sodium and it is ok to rinse vegetable with water.      Hypoglycemia Hypoglycemia is when the sugar (glucose) level in your blood is too low. Signs of low blood sugar may include:  Feeling: ? Hungry. ? Worried or nervous (anxious). ? Sweaty and clammy. ? Confused. ? Dizzy. ? Sleepy. ? Sick to your stomach (nauseous).  Having: ? A fast heartbeat. ? A headache. ? A change in your vision. ? Tingling or no feeling (numbness) around your mouth, lips, or tongue. ? Jerky movements that you cannot control (seizure).  Having trouble with: ? Moving (coordination). ? Sleeping. ? Passing out (fainting). ? Getting upset easily (irritability). Low blood sugar can happen to people who have diabetes and people who do not have diabetes. Low blood sugar can happen quickly, and it can be an emergency. Treating low blood sugar Low blood sugar is often treated by eating or drinking something sugary right away, such as:  Fruit juice, 4-6 oz (120-150 mL).  Regular soda (not diet soda), 4-6 oz (120-150 mL).  Low-fat milk, 4 oz (120 mL).  Several pieces of hard candy.  Sugar or honey, 1 Tbsp (15 mL). Treating low blood sugar if you have diabetes If you can think clearly and swallow safely, follow the 15:15 rule:  Take 15 grams of a fast-acting carb (carbohydrate). Talk with your doctor about how much you should take.  Always keep a source of fast-acting carb with you, such as: ? Sugar tablets (glucose pills). Take 3-4 pills. ? 6-8 pieces of hard candy. ? 4-6  oz (120-150 mL) of fruit juice. ? 4-6 oz (120-150 mL) of regular (not diet) soda. ? 1 Tbsp (15 mL) honey or sugar.  Check your blood sugar 15 minutes after you take the carb.  If your blood sugar is still at or below 70 mg/dL (3.9 mmol/L), take 15 grams of a carb again.  If your blood sugar does not go above 70 mg/dL (3.9 mmol/L) after 3 tries, get help right away.  After your blood sugar goes back to normal, eat a meal or a snack within 1 hour.  Treating very low blood sugar If your blood sugar is at or below 54 mg/dL (3 mmol/L), you have very low blood sugar (severe hypoglycemia). This may also cause:  Passing out.  Jerky movements you cannot control (seizure).  Losing consciousness (coma). This is an emergency. Do not wait to see if the symptoms will go away. Get medical help right away. Call your local emergency services (911 in the [place] Do not drive yourself to the hospital. If you have very low blood sugar and you cannot eat or drink, you may need a glucagon shot (injection). A family member or friend should learn how to check your blood sugar and how to give you a glucagon shot. Ask your doctor if you need to have a glucagon shot kit at home. Follow these instructions at home: General instructions  Take over-the-counter and prescription medicines only as told by your doctor.  Stay aware of  your blood sugar as told by your doctor.  Limit alcohol intake to no more than 1 drink a day for nonpregnant women and 2 drinks a day for men. One drink equals 12 oz of beer (355 mL), 5 oz of wine (148 mL), or 1 oz of hard liquor (44 mL).  Keep all follow-up visits as told by your doctor. This is important. If you have diabetes:   Follow your diabetes care plan as told by your doctor. Make sure you: ? Know the signs of low blood sugar. ? Take your medicines as told. ? Follow your exercise and meal plan. ? Eat on time. Do not skip meals. ? Check your blood sugar as often as told by  your doctor. Always check it before and after exercise. ? Follow your sick day plan when you cannot eat or drink normally. Make this plan ahead of time with your doctor.  Share your diabetes care plan with: ? Your work or school. ? People you live with.  Check your pee (urine) for ketones: ? When you are sick. ? As told by your doctor.  Carry a card or wear jewelry that says you have diabetes. Contact a doctor if:  You have trouble keeping your blood sugar in your target range.  You have low blood sugar often. Get help right away if:  You still have symptoms after you eat or drink something sugary.  Your blood sugar is at or below 54 mg/dL (3 mmol/L).  You have jerky movements that you cannot control.  You pass out. These symptoms may be an emergency. Do not wait to see if the symptoms will go away. Get medical help right away. Call your local emergency services (911 in the [place] Do not drive yourself to the hospital. Summary  Hypoglycemia happens when the level of sugar (glucose) in your blood is too low.  Low blood sugar can happen to people who have diabetes and people who do not have diabetes. Low blood sugar can happen quickly, and it can be an emergency.  Make sure you know the signs of low blood sugar and know how to treat it.  Always keep a source of sugar (fast-acting carb) with you to treat low blood sugar. This information is not intended to replace advice given to you by your health care provider. Make sure you discuss any questions you have with your health care provider. Document Released: 12/05/2009 Document Revised: 03/04/2018 Document Reviewed: 10/14/2015 Elsevier Interactive Patient Education  2019 [employer].

## 2018-10-14 NOTE — Progress Notes (Signed)
stablished Patient Office Visit  Subjective:  Patient ID: Theodore [name], male    DOB: 08-Aug-1966  Age: 53 y.o. MRN: [id number]  CC:  Chief Complaint  Patient presents with  . Diabetes    HPI DARIEL [name] presents for evaluation of diabetes and hypertension follow-up.  Theodore [name] has Diabetes mellitus type 2, uncontrolled, without complications (HCC); Sinoatrial bradycardia; Depression with anxiety; Neurocardiogenic syncope; Pacemaker; Tachycardia; Paroxysmal atrial fibrillation (HCC); Diabetes mellitus type 2 in nonobese [facility]); Status post lumbar spine surgery for decompression of spinal cord; and Neurogenic orthostatic hypotension (HCC) on their problem list.  Theodore [name] monitors glucose at home. Adheres to current medication regimen. He reports reading have been elevated and he has had multiple episodes of hypoglycemia. He admits to inconsistent food intake. When he eats foods, food choices are high in carbohydrates. His current regimen is Novolog according to sliding scale and Baslagar 50 units. Readings have ranged in upper 300's and as low 45. Most recent A1C 9.3.  Reports associated neuropathic pain, which persists in spite of regularly taking Gabapentin at bedtime only.  Denies any associated urinary frequency, visual disturbances, episodic diaphoresis or hot flashes. Engages in no routine exercise and nor adheres to diabetes diet. Previously prescribed lisinopril for renal protection. Blood pressure has remained elevated x last 2 visits. He has been without lisinopril several months. Uncertain as to why medication was discontinued.  He denies chest pain, shortness of breath, dizziness, weakness, or palpitations.  Past Medical History:  Diagnosis Date  . Anxiety   . Arthritis   . Bradycardia   . Depression   . Diabetes mellitus    dx 2010  . Pacemaker    Permanent pacemaker placement by Dr. Molly Maduro McQueen--10/21/2007--a Medtronic Adapta--serial number #[id number] H   . Syncope 09/2007   2D Echo--History of frequent episodes of syncope beginning at 53 years of age--usually associated with some type of vagal issue but he does completely lose consciousness    Past Surgical History:  Procedure Laterality Date  . CARDIAC CATHETERIZATION Right 10/20/2007  . HAND SURGERY    . LUMBAR LAMINECTOMY/DECOMPRESSION MICRODISCECTOMY N/A 06/05/2018   Procedure: Lumbar decompression L4-S1;  Surgeon: [name], MD;  Location: [facility] OR;  Service: Orthopedics;  Laterality: N/A;  3 hrs  . PACEMAKER INSERTION  10/21/2007   Dr Jenne Campus  . SHOULDER SURGERY  1999  . TONSILLECTOMY      Family History  Problem Relation Age of Onset  . Diabetes Sister   . Diabetes Mother   . Coronary artery disease Mother 22's  . Hypertension Mother     Social History   Socioeconomic History  . Marital status: Married    Spouse name: Not on file  . Number of children: Not on file  . Years of education: Not on file  . Highest education level: Not on file  Occupational History  . Not on file  Social Needs  . Financial resource strain: Not on file  . Food insecurity:    Worry: Not on file    Inability: Not on file  . Transportation needs:    Medical: Not on file    Non-medical: Not on file  Tobacco Use  . Smoking status: Former Smoker    Last attempt to quit: 10/17/2007    Years since quitting: 11.0  . Smokeless tobacco: Former Engineer, water and Sexual Activity  . Alcohol use: No  . Drug use: No  . Sexual activity: Not on file  Lifestyle  .  Physical activity:    Days per week: Not on file    Minutes per session: Not on file  . Stress: Not on file  Relationships  . Social connections:    Talks on phone: Not on file    Gets together: Not on file    Attends religious service: Not on file    Active member of club or organization: Not on file    Attends meetings of clubs or organizations: Not on file    Relationship status: Not on file  . Intimate partner violence:     Fear of current or ex partner: Not on file    Emotionally abused: Not on file    Physically abused: Not on file    Forced sexual activity: Not on file  Other Topics Concern  . Not on file  Social History Narrative  . Not on file    Outpatient Medications Prior to Visit  Medication Sig Dispense Refill  . ACCU-CHEK AVIVA PLUS test strip     . citalopram (CELEXA) 20 MG tablet Take 1 tablet (20 mg total) by mouth daily. 30 tablet 11  . gabapentin (NEURONTIN) 300 MG capsule Take 1 capsule (300 mg total) by mouth 2 (two) times daily. 60 capsule 2  . indomethacin (INDOCIN) 25 MG capsule Take 25 mg by mouth 2 (two) times daily with a meal.    . Insulin Glargine (BASAGLAR KWIKPEN) 100 UNIT/ML SOPN Inject 0.5 mLs (50 Units total) into the skin daily. 15 pen 1  . Insulin Pen Needle (B-D UF III MINI PEN NEEDLES) 31G X 5 MM MISC BD Ultra-Fine Short Pen Needle 31 gauge x 5/16"  FOR USE WITH INJECTION OF TRESIBA AND NOVOLOG 200 each 1  . ipratropium (ATROVENT) 0.03 % nasal spray Place 2 sprays into both nostrils 3 (three) times daily. 30 mL 0  . JARDIANCE 10 MG TABS tablet Take 10 mg by mouth every morning.   2  . levocetirizine (XYZAL) 5 MG tablet Take 1 tablet (5 mg total) by mouth every evening. 30 tablet 1  . lisinopril (PRINIVIL,ZESTRIL) 5 MG tablet Take 1 tablet (5 mg total) by mouth daily. 90 tablet 2  . metFORMIN (GLUCOPHAGE) 1000 MG tablet Take 1 tablet (1,000 mg total) by mouth 2 (two) times daily with a meal. 180 tablet 3  . NOVOLIN R 100 UNIT/ML injection Inject into the skin daily. Sliding scale    . ondansetron (ZOFRAN ODT) 4 MG disintegrating tablet Take 1 tablet (4 mg total) by mouth every 8 (eight) hours as needed for nausea or vomiting. 20 tablet 0   No facility-administered medications prior to visit.     Allergies  Allergen Reactions  . Cephalexin Hives  . Vancomycin Hives, Itching and Other (See Comments)    True Allergic Reaction -- Not "Red Man's Syndrome Onset of  prominent red rash, severe itching/hives Not responsive to Benadryl and IV rate reduction. First seen pre-op for surgery on 06/05/18.    ROS Review of Systems Pertinent negatives listed in HPI   Objective:    Physical Exam  BP (!) 142/94   Pulse 86   Resp 17   Ht 5\' 8"  (1.727 m)   Wt 179 lb (81.2 kg)   SpO2 97%   BMI 27.22 kg/m    General appearance: alert, well developed, well nourished, cooperative and in no distress Head: Normocephalic, without obvious abnormality, atraumatic Respiratory: Respirations even and unlabored, normal respiratory rate Heart: Regular rate, rhythm, no murmurs or gallops Extremities: No  gross deformities Skin: Skin color, texture, turgor normal. No rashes seen  Psych: Appropriate mood and affect. Neurologic: Mental status: Alert, oriented to person, place, and time, thought content appropriate. Wt Readings from Last 3 Encounters:  10/14/18 179 lb (81.2 kg)  10/06/18 180 lb 6.4 oz (81.8 kg)  06/05/18 168 lb 9 oz (76.5 kg)     Health Maintenance Due  Topic Date Due  . PNEUMOCOCCAL POLYSACCHARIDE VACCINE AGE 24-64 HIGH RISK  12/06/1967  . FOOT EXAM  12/06/1975  . OPHTHALMOLOGY EXAM  12/06/1975  . HIV Screening  12/05/1980  . TETANUS/TDAP  12/05/1984  . COLONOSCOPY  12/06/2015  . INFLUENZA VACCINE  04/24/2018    There are no preventive care reminders to display for this patient.  Lab Results  Component Value Date   TSH 4.100 10/06/2018   Lab Results  Component Value Date   WBC 9.7 06/07/2018   HGB 12.9 (L) 06/07/2018   HCT 38.6 (L) 06/07/2018   MCV 93.9 06/07/2018   PLT 130 (L) 06/07/2018   Lab Results  Component Value Date   NA 136 10/06/2018   K 4.3 10/06/2018   CO2 22 10/06/2018   GLUCOSE 379 (H) 10/06/2018   BUN 18 10/06/2018   CREATININE 0.79 10/06/2018   BILITOT 0.2 10/06/2018   ALKPHOS 77 10/06/2018   AST 48 (H) 10/06/2018   ALT 40 10/06/2018   PROT 6.0 10/06/2018   ALBUMIN 3.4 (L) 10/06/2018   CALCIUM 8.7  10/06/2018   ANIONGAP 9 06/07/2018   Lab Results  Component Value Date   CHOL 138 10/22/2011   Lab Results  Component Value Date   HDL 26 (L) 10/22/2011   Lab Results  Component Value Date   LDLCALC 40 10/22/2011   Lab Results  Component Value Date   TRIG 359 (H) 10/22/2011   Lab Results  Component Value Date   CHOLHDL 5.3 10/22/2011   Lab Results  Component Value Date   HGBA1C 9.3 (H) 10/06/2018     Assessment & Plan:  1. Diabetes mellitus type 2, uncontrolled, without complications (HCC) CBG low today. Reviewed home readings and glucose readings are up and down. Reinforced the importance of checking blood sugar prior to dosing insulin to avoid hypoglycemia. Recommended Diabetes Education and or endocrinology, patient would like to wait and make lifestyle adjustments. Given recent hypoglycemia, recommend decreasing Basalglar 40 units daily with breakfast. Continue SS only if needed. Current hypoglycemia treated with snack in office. Blood sugar prior to discharge 98. - Glucose (CBG) - Glucose (CBG)  2. Essential hypertension, elevated Start lisinopril 5 mg once daily  We have discussed target BP range and blood pressure goal. I have advised patient to check BP regularly and to call us back or report to clinic if the numbers are consistently higher than 140/90. We discussed the importance of compliance with medical therapy and DASH diet recommended, consequences of uncontrolled hypertension discussed.    Meds ordered this encounter  Medications  . Insulin Glargine (BASAGLAR KWIKPEN) 100 UNIT/ML SOPN    Sig: Inject 0.4 mLs (40 Units total) into the skin daily.    Dispense:  15 pen    Refill:  1   Orders Placed This Encounter  Procedures  . Pneumococcal polysaccharide vaccine 23-valent greater than or equal to 2yo subcutaneous/IM  . Glucose (CBG)  . Glucose (CBG)     Follow-up: No follow-ups on file.    Joaquin Courts, FNP

## 2018-10-30 ENCOUNTER — Other Ambulatory Visit: Payer: Self-pay | Admitting: Family Medicine

## 2018-11-25 ENCOUNTER — Ambulatory Visit (INDEPENDENT_AMBULATORY_CARE_PROVIDER_SITE_OTHER): Payer: BLUE CROSS/BLUE SHIELD | Admitting: Family Medicine

## 2018-11-25 ENCOUNTER — Encounter: Payer: Self-pay | Admitting: Family Medicine

## 2018-11-25 VITALS — BP 114/78 | HR 88 | Resp 17 | Ht 68.0 in | Wt 179.0 lb

## 2018-11-25 DIAGNOSIS — E1165 Type 2 diabetes mellitus with hyperglycemia: Secondary | ICD-10-CM

## 2018-11-25 DIAGNOSIS — IMO0001 Reserved for inherently not codable concepts without codable children: Secondary | ICD-10-CM

## 2018-11-25 DIAGNOSIS — I10 Essential (primary) hypertension: Secondary | ICD-10-CM

## 2018-11-25 LAB — GLUCOSE, POCT (MANUAL RESULT ENTRY): POC Glucose: 87 mg/dl (ref 70–99)

## 2018-11-25 NOTE — Progress Notes (Signed)
Patient ID: Theodore [name], male    DOB: 11-09-1965, 53 y.o.   MRN: [id number]  PCP: Bing Neighbors, FNP  No chief complaint on file.   Subjective:  HPI Theodore [name] is a 53 y.o. male presents for diabetes follow-up  Theodore [name] monitors glucose at home.  Adheres to current medication regimen.  Current Regimen:Novolog according to sliding scale and Baslagar 50 units. Patient was advised to inquire about cost of adding Victoza or Trulicity to current regiment with current prescription drug plan. He has not had time to research this as of yet. Recently moved and finally getting settled. Reports this helping him eat better. Most recent A1C 9.3.  Home BG= high 200 or less with the exception of one >200 when ate heavy junk food the night prior  Home BG=Low occurring in the AM. Eats prior to 7 pm and normally in bed by 8:30 pm. Average home readings= 140-150's Diet changes: cutting back on carbohydrates, however, continues to skip meals periodically. Exercise: Currently no routine physical activity although is very active on feet all day at work. Denies chest pain, dizziness, shortness of breath, of new weakness.  Social History   Socioeconomic History  . Marital status: Married    Spouse name: Not on file  . Number of children: Not on file  . Years of education: Not on file  . Highest education level: Not on file  Occupational History  . Not on file  Social Needs  . Financial resource strain: Not on file  . Food insecurity:    Worry: Not on file    Inability: Not on file  . Transportation needs:    Medical: Not on file    Non-medical: Not on file  Tobacco Use  . Smoking status: Former Smoker    Last attempt to quit: 10/17/2007    Years since quitting: 11.1  . Smokeless tobacco: Former Engineer, water and Sexual Activity  . Alcohol use: No  . Drug use: No  . Sexual activity: Not on file  Lifestyle  . Physical activity:    Days per week: Not on file     Minutes per session: Not on file  . Stress: Not on file  Relationships  . Social connections:    Talks on phone: Not on file    Gets together: Not on file    Attends religious service: Not on file    Active member of club or organization: Not on file    Attends meetings of clubs or organizations: Not on file    Relationship status: Not on file  . Intimate partner violence:    Fear of current or ex partner: Not on file    Emotionally abused: Not on file    Physically abused: Not on file    Forced sexual activity: Not on file  Other Topics Concern  . Not on file  Social History Narrative  . Not on file    Family History  Problem Relation Age of Onset  . Diabetes Sister   . Diabetes Mother   . Coronary artery disease Mother 88's  . Hypertension Mother    Review of Systems Pertinent negatives listed in HPI Patient Active Problem List   Diagnosis Date Noted  . Neurogenic orthostatic hypotension (HCC) 06/06/2018  . Status post lumbar spine surgery for decompression of spinal cord 06/05/2018  . Paroxysmal atrial fibrillation (HCC) 12/12/2017  . Diabetes mellitus type 2 in nonobese (HCC) 12/12/2017  . Neurocardiogenic syncope 07/23/2014  .  Pacemaker 07/23/2014  . Tachycardia 07/23/2014  . Diabetes mellitus type 2, uncontrolled, without complications (HCC) 10/20/2011  . Sinoatrial bradycardia 10/20/2011  . Depression with anxiety 10/20/2011    Allergies  Allergen Reactions  . Cephalexin Hives  . Vancomycin Hives, Itching and Other (See Comments)    True Allergic Reaction -- Not "Red Man's Syndrome Onset of prominent red rash, severe itching/hives Not responsive to Benadryl and IV rate reduction. First seen pre-op for surgery on 06/05/18.    Prior to Admission medications   Medication Sig Start Date End Date Taking? Authorizing Provider  ACCU-CHEK AVIVA PLUS test strip  09/18/18   [provider]  citalopram (CELEXA) 20 MG tablet Take 1 tablet (20 mg total) by  mouth daily. 10/06/18   Bing Neighbors, FNP  gabapentin (NEURONTIN) 300 MG capsule TAKE 1 CAPSULE BY MOUTH TWICE A DAY 10/30/18   Bing Neighbors, FNP  indomethacin (INDOCIN) 25 MG capsule Take 25 mg by mouth 2 (two) times daily with a meal.    [provider]  Insulin Glargine (BASAGLAR KWIKPEN) 100 UNIT/ML SOPN Inject 0.4 mLs (40 Units total) into the skin daily. 10/14/18   Bing Neighbors, FNP  Insulin Pen Needle (B-D UF III MINI PEN NEEDLES) 31G X 5 MM MISC BD Ultra-Fine Short Pen Needle 31 gauge x 5/16"  FOR USE WITH INJECTION OF TRESIBA AND NOVOLOG 10/06/18   Bing Neighbors, FNP  ipratropium (ATROVENT) 0.03 % nasal spray Place 2 sprays into both nostrils 3 (three) times daily. 10/06/18   Bing Neighbors, FNP  levocetirizine (XYZAL) 5 MG tablet TAKE 1 TABLET BY MOUTH EVERY DAY IN THE EVENING 10/30/18   Bing Neighbors, FNP  lisinopril (PRINIVIL,ZESTRIL) 5 MG tablet Take 1 tablet (5 mg total) by mouth daily. 10/06/18   Bing Neighbors, FNP  metFORMIN (GLUCOPHAGE) 1000 MG tablet Take 1 tablet (1,000 mg total) by mouth 2 (two) times daily with a meal. 10/06/18   Bing Neighbors, FNP  NOVOLIN R 100 UNIT/ML injection Inject into the skin daily. Sliding scale 12/10/17   [provider]    Past Medical, Surgical Family and Social History reviewed and updated.    Objective:   Today's Vitals   11/25/18 1513  BP: 114/78  Pulse: 88  Resp: 17  SpO2: 95%  Weight: 179 lb (81.2 kg)  Height: 5\' 8"  (1.727 m)    Wt Readings from Last 3 Encounters:  10/14/18 179 lb (81.2 kg)  10/06/18 180 lb 6.4 oz (81.8 kg)  06/05/18 168 lb 9 oz (76.5 kg)   Physical Exam General appearance: alert, well developed, well nourished, cooperative and in no distress Head: Normocephalic, without obvious abnormality, atraumatic Respiratory: Respirations even and unlabored, normal respiratory rate Heart: rate and rhythm normal. No gallop or murmurs noted on exam  Abdomen: BS +, no  distention, no rebound tenderness, or no mass Extremities: No gross deformities Skin: Skin color, texture, turgor normal. No rashes seen  Psych: Appropriate mood and affect. Neurologic: Mental status: Alert, oriented to person, place, and time, thought content appropriate. Lab Results  Component Value Date   POCGLU 98 10/14/2018   POCGLU 68 (A) 10/14/2018   POCGLU 441 (A) 10/06/2018    Lab Results  Component Value Date   HGBA1C 9.3 (H) 10/06/2018    Assessment & Plan:  1. Diabetes mellitus type 2, uncontrolled, without complications (HCC) -home readings improved. Concerned regarding hypoglycemia. He has not required short acting insulin. Continue reduced Basaglar to 40  units daily. If low readings persist, consider reducing to 30 units. Will know more once A1C received.  Advised to follow-up with insurance to inquire about affordability of Victoza or Trulicity. - Glucose (CBG) - Hemoglobin A1c; Future  2. Essential hypertension, well controlled We have discussed target BP range and blood pressure goal. I have advised patient to check BP regularly and to call us back or report to clinic if the numbers are consistently higher than 140/90. We discussed the importance of compliance with medical therapy and DASH diet recommended, consequences of uncontrolled hypertension discussed.  - continue current BP medications   Patient provided lab orders to obtain through [facility].  Patient received a high bill from [employer] as his insurance provider prefers [facility].  Advised patient to get labs completed as soon as possible.    -The patient was given clear instructions to go to ER or return to medical center if symptoms do not improve, worsen or new problems develop. The patient verbalized understanding.    Joaquin Courts, FNP Primary Care at [facility] [address] [place] [address] [phone]: [phone]

## 2018-12-04 ENCOUNTER — Other Ambulatory Visit: Payer: Self-pay | Admitting: Family Medicine

## 2018-12-04 LAB — HEMOGLOBIN A1C: Hemoglobin A1C: 7.4

## 2018-12-04 NOTE — Telephone Encounter (Signed)
Contact patient to advise that his A1c has improved to 7.4.  Given that he has had some recent lows in his glucose I would like to reduce his long-acting insulin from 40 units to 30 units once daily and I will see him back in 3 months

## 2018-12-05 MED ORDER — BASAGLAR KWIKPEN 100 UNIT/ML ~~LOC~~ SOPN: 30.0000 [IU] | PEN_INJECTOR | Freq: Every day | SUBCUTANEOUS | 1 refills | Status: DC

## 2018-12-05 NOTE — Telephone Encounter (Signed)
Patient notified of lab results & recommendations. Expressed understanding. 

## 2018-12-05 NOTE — Telephone Encounter (Signed)
We will stay with his current regimen and discuss changes if warranted at next follow-up

## 2018-12-05 NOTE — Addendum Note (Signed)
Addended by: Heidi Dach on: 12/05/2018 03:21 PM   Modules accepted: Orders

## 2018-12-05 NOTE — Telephone Encounter (Signed)
Patient states that he checked on the price of the Jardiance with his insurance & it is about $300. He wants to know if you still want to restart him on the Jardiance after his recent lab results.

## 2018-12-09 NOTE — Telephone Encounter (Signed)
Patient aware.

## 2018-12-15 ENCOUNTER — Other Ambulatory Visit: Payer: Self-pay | Admitting: Family Medicine

## 2018-12-22 ENCOUNTER — Other Ambulatory Visit: Payer: Self-pay

## 2018-12-22 ENCOUNTER — Encounter: Payer: BLUE CROSS/BLUE SHIELD | Admitting: *Deleted

## 2018-12-23 ENCOUNTER — Telehealth: Payer: Self-pay

## 2018-12-23 NOTE — Telephone Encounter (Signed)
Unable to leave a message for patient to remind of missed remote transmission.  

## 2019-02-24 ENCOUNTER — Telehealth: Payer: Self-pay

## 2019-02-24 NOTE — Telephone Encounter (Signed)
Called patient to do their pre-visit COVID screening.  Call went to a voicemail that hasn't been set up yet. Unable to do prescreening.

## 2019-02-25 ENCOUNTER — Ambulatory Visit (INDEPENDENT_AMBULATORY_CARE_PROVIDER_SITE_OTHER): Payer: BLUE CROSS/BLUE SHIELD | Admitting: Family Medicine

## 2019-02-25 ENCOUNTER — Other Ambulatory Visit: Payer: Self-pay

## 2019-02-25 ENCOUNTER — Encounter: Payer: Self-pay | Admitting: Family Medicine

## 2019-02-25 VITALS — BP 125/84 | HR 81 | Temp 98.1°F | Resp 17 | Ht 68.0 in | Wt 172.6 lb

## 2019-02-25 DIAGNOSIS — E1165 Type 2 diabetes mellitus with hyperglycemia: Secondary | ICD-10-CM | POA: Diagnosis not present

## 2019-02-25 DIAGNOSIS — I10 Essential (primary) hypertension: Secondary | ICD-10-CM

## 2019-02-25 DIAGNOSIS — E1159 Type 2 diabetes mellitus with other circulatory complications: Secondary | ICD-10-CM

## 2019-02-25 DIAGNOSIS — Z794 Long term (current) use of insulin: Secondary | ICD-10-CM

## 2019-02-25 NOTE — Progress Notes (Deleted)
States that fasting readings are usually 80-110 fasting. May go in the 120s-150s if he overdid it at dinner the night before.

## 2019-02-25 NOTE — Patient Instructions (Signed)
Diabetes Basics    Diabetes (diabetes mellitus) is a long-term (chronic) disease. It occurs when the body does not properly use sugar (glucose) that is released from food after you eat.  Diabetes may be caused by one or both of these problems:  · Your pancreas does not make enough of a hormone called insulin.  · Your body does not react in a normal way to insulin that it makes.  Insulin lets sugars (glucose) go into cells in your body. This gives you energy. If you have diabetes, sugars cannot get into cells. This causes high blood sugar (hyperglycemia).  Follow these instructions at home:  How is diabetes treated?  You may need to take insulin or other diabetes medicines daily to keep your blood sugar in balance. Take your diabetes medicines every day as told by your doctor. List your diabetes medicines here:  Diabetes medicines  · Name of medicine: ______________________________  ? Amount (dose): _______________ Time (a.m./p.m.): _______________ Notes: ___________________________________  · Name of medicine: ______________________________  ? Amount (dose): _______________ Time (a.m./p.m.): _______________ Notes: ___________________________________  · Name of medicine: ______________________________  ? Amount (dose): _______________ Time (a.m./p.m.): _______________ Notes: ___________________________________  If you use insulin, you will learn how to give yourself insulin by injection. You may need to adjust the amount based on the food that you eat. List the types of insulin you use here:  Insulin  · Insulin type: ______________________________  ? Amount (dose): _______________ Time (a.m./p.m.): _______________ Notes: ___________________________________  · Insulin type: ______________________________  ? Amount (dose): _______________ Time (a.m./p.m.): _______________ Notes: ___________________________________  · Insulin type: ______________________________  ? Amount (dose): _______________ Time (a.m./p.m.):  _______________ Notes: ___________________________________  · Insulin type: ______________________________  ? Amount (dose): _______________ Time (a.m./p.m.): _______________ Notes: ___________________________________  · Insulin type: ______________________________  ? Amount (dose): _______________ Time (a.m./p.m.): _______________ Notes: ___________________________________  How do I manage my blood sugar?    Check your blood sugar levels using a blood glucose monitor as directed by your doctor.  Your doctor will set treatment goals for you. Generally, you should have these blood sugar levels:  · Before meals (preprandial): 80-130 mg/dL (4.4-7.2 mmol/L).  · After meals (postprandial): below 180 mg/dL (10 mmol/L).  · A1c level: less than 7%.  Write down the times that you will check your blood sugar levels:  Blood sugar checks  · Time: _______________ Notes: ___________________________________  · Time: _______________ Notes: ___________________________________  · Time: _______________ Notes: ___________________________________  · Time: _______________ Notes: ___________________________________  · Time: _______________ Notes: ___________________________________  · Time: _______________ Notes: ___________________________________    What do I need to know about low blood sugar?  Low blood sugar is called hypoglycemia. This is when blood sugar is at or below 70 mg/dL (3.9 mmol/L). Symptoms may include:  · Feeling:  ? Hungry.  ? Worried or nervous (anxious).  ? Sweaty and clammy.  ? Confused.  ? Dizzy.  ? Sleepy.  ? Sick to your stomach (nauseous).  · Having:  ? A fast heartbeat.  ? A headache.  ? A change in your vision.  ? Tingling or no feeling (numbness) around the mouth, lips, or tongue.  ? Jerky movements that you cannot control (seizure).  · Having trouble with:  ? Moving (coordination).  ? Sleeping.  ? Passing out (fainting).  ? Getting upset easily (irritability).  Treating low blood sugar  To treat low blood  sugar, eat or drink something sugary right away. If you can think clearly and swallow safely, follow the 15:15   rule:  · Take 15 grams of a fast-acting carb (carbohydrate). Talk with your doctor about how much you should take.  · Some fast-acting carbs are:  ? Sugar tablets (glucose pills). Take 3-4 glucose pills.  ? 6-8 pieces of hard candy.  ? 4-6 oz (120-150 mL) of fruit juice.  ? 4-6 oz (120-150 mL) of regular (not diet) soda.  ? 1 Tbsp (15 mL) honey or sugar.  · Check your blood sugar 15 minutes after you take the carb.  · If your blood sugar is still at or below 70 mg/dL (3.9 mmol/L), take 15 grams of a carb again.  · If your blood sugar does not go above 70 mg/dL (3.9 mmol/L) after 3 tries, get help right away.  · After your blood sugar goes back to normal, eat a meal or a snack within 1 hour.  Treating very low blood sugar  If your blood sugar is at or below 54 mg/dL (3 mmol/L), you have very low blood sugar (severe hypoglycemia). This is an emergency. Do not wait to see if the symptoms will go away. Get medical help right away. Call your local emergency services (911 in the [place] Do not drive yourself to the hospital.  Questions to ask your health care provider  · Do I need to meet with a diabetes educator?  · What equipment will I need to care for myself at home?  · What diabetes medicines do I need? When should I take them?  · How often do I need to check my blood sugar?  · What number can I call if I have questions?  · When is my next doctor's visit?  · Where can I find a support group for people with diabetes?  Where to find more information  · American Diabetes Association: www.diabetes.org  · American Association of Diabetes Educators: www.diabeteseducator.org/patient-resources  Contact a doctor if:  · Your blood sugar is at or above 240 mg/dL (13.3 mmol/L) for 2 days in a row.  · You have been sick or have had a fever for 2 days or more, and you are not getting better.  · You have any of these  problems for more than 6 hours:  ? You cannot eat or drink.  ? You feel sick to your stomach (nauseous).  ? You throw up (vomit).  ? You have watery poop (diarrhea).  Get help right away if:  · Your blood sugar is lower than 54 mg/dL (3 mmol/L).  · You get confused.  · You have trouble:  ? Thinking clearly.  ? Breathing.  Summary  · Diabetes (diabetes mellitus) is a long-term (chronic) disease. It occurs when the body does not properly use sugar (glucose) that is released from food after digestion.  · Take insulin and diabetes medicines as told.  · Check your blood sugar every day, as often as told.  · Keep all follow-up visits as told by your doctor. This is important.  This information is not intended to replace advice given to you by your health care provider. Make sure you discuss any questions you have with your health care provider.  Document Released: 12/13/2017 Document Revised: 03/03/2018 Document Reviewed: 12/13/2017  Elsevier Interactive Patient Education © 2019 [employer]

## 2019-02-28 NOTE — Progress Notes (Signed)
Patient ID: Theodore [name], male    DOB: 06/21/1966, 53 y.o.   MRN: [id number]  PCP: Bing NeighborsHarris, Nicholai Willette S, FNP  Chief Complaint  Patient presents with  . Diabetes    Subjective:  HPI Theodore [name] is a 53 y.o. male presents for diabetes follow-up.  Diabetes  Theodore [name] monitors glucose at home.Adheres to current medication regimen. Current Regimen: Baslagar 50 units and metformin. Reports cutting back carbohydrates.Most recent A1C 7.4. Home BG: 80-150's. Today's glucose 227, reports recently eating. Fasting readings 80-110. Denies hypoglycemia. Exercise: Currently no routine physical activity although is very active on feet all day at work. Denies chest pain, dizziness, shortness of breath, of new weakness.   Hypertension, Essential No home monitoring of blood pressure. Currently controlled lisinopril 5 mg. He is a former smoker-quit 2009. He denies chest pain, shortness of breath, swelling, headache, or new weakness.   Social History   Socioeconomic History  . Marital status: Married    Spouse name: Not on file  . Number of children: Not on file  . Years of education: Not on file  . Highest education level: Not on file  Occupational History  . Not on file  Social Needs  . Financial resource strain: Not on file  . Food insecurity:    Worry: Not on file    Inability: Not on file  . Transportation needs:    Medical: Not on file    Non-medical: Not on file  Tobacco Use  . Smoking status: Former Smoker    Last attempt to quit: 10/17/2007    Years since quitting: 11.3  . Smokeless tobacco: Former Engineer, waterUser  Substance and Sexual Activity  . Alcohol use: No  . Drug use: No  . Sexual activity: Not on file  Lifestyle  . Physical activity:    Days per week: Not on file    Minutes per session: Not on file  . Stress: Not on file  Relationships  . Social connections:    Talks on phone: Not on file    Gets together: Not on file    Attends religious service: Not on file    Active member of club or organization: Not on file    Attends meetings of clubs or organizations: Not on file    Relationship status: Not on file  . Intimate partner violence:    Fear of current or ex partner: Not on file    Emotionally abused: Not on file    Physically abused: Not on file    Forced sexual activity: Not on file  Other Topics Concern  . Not on file  Social History Narrative  . Not on file    Family History  Problem Relation Age of Onset  . Diabetes Sister   . Diabetes Mother   . Coronary artery disease Mother 6750's  . Hypertension Mother    Review of Systems Pertinent negatives listed in HPI Patient Active Problem List   Diagnosis Date Noted  . Neurogenic orthostatic hypotension (HCC) 06/06/2018  . Status post lumbar spine surgery for decompression of spinal cord 06/05/2018  . Paroxysmal atrial fibrillation (HCC) 12/12/2017  . Diabetes mellitus type 2 in nonobese (HCC) 12/12/2017  . Neurocardiogenic syncope 07/23/2014  . Pacemaker 07/23/2014  . Tachycardia 07/23/2014  . Diabetes mellitus type 2, uncontrolled, without complications (HCC) 10/20/2011  . Sinoatrial bradycardia 10/20/2011  . Depression with anxiety 10/20/2011    Allergies  Allergen Reactions  . Cephalexin Hives  . Vancomycin Hives, Itching and Other (  See Comments)    True Allergic Reaction -- Not "Red Man's Syndrome Onset of prominent red rash, severe itching/hives Not responsive to Benadryl and IV rate reduction. First seen pre-op for surgery on 06/05/18.    Prior to Admission medications   Medication Sig Start Date End Date Taking? Authorizing Provider  ACCU-CHEK AVIVA PLUS test strip  09/18/18   [provider]  citalopram (CELEXA) 20 MG tablet Take 1 tablet (20 mg total) by mouth daily. 10/06/18   Scot Jun, FNP  gabapentin (NEURONTIN) 300 MG capsule TAKE 1 CAPSULE BY MOUTH TWICE A DAY 10/30/18   Scot Jun, FNP  indomethacin (INDOCIN) 25 MG capsule Take 25 mg  by mouth 2 (two) times daily with a meal.    [provider]  Insulin Glargine (BASAGLAR KWIKPEN) 100 UNIT/ML SOPN Inject 0.4 mLs (40 Units total) into the skin daily. 10/14/18   Scot Jun, FNP  Insulin Pen Needle (B-D UF III MINI PEN NEEDLES) 31G X 5 MM MISC BD Ultra-Fine Short Pen Needle 31 gauge x 5/16"  FOR USE WITH INJECTION OF TRESIBA AND NOVOLOG 10/06/18   Scot Jun, FNP  ipratropium (ATROVENT) 0.03 % nasal spray Place 2 sprays into both nostrils 3 (three) times daily. 10/06/18   Scot Jun, FNP  levocetirizine (XYZAL) 5 MG tablet TAKE 1 TABLET BY MOUTH EVERY DAY IN THE EVENING 10/30/18   Scot Jun, FNP  lisinopril (PRINIVIL,ZESTRIL) 5 MG tablet Take 1 tablet (5 mg total) by mouth daily. 10/06/18   Scot Jun, FNP  metFORMIN (GLUCOPHAGE) 1000 MG tablet Take 1 tablet (1,000 mg total) by mouth 2 (two) times daily with a meal. 10/06/18   Scot Jun, FNP  NOVOLIN R 100 UNIT/ML injection Inject into the skin daily. Sliding scale 12/10/17   [provider]    Past Medical, Surgical Family and Social History reviewed and updated.    Objective:   Today's Vitals   02/25/19 1535  BP: 125/84  Pulse: 81  Resp: 17  Temp: 98.1 F (36.7 C)  TempSrc: Temporal  SpO2: 95%  Weight: 172 lb 9.6 oz (78.3 kg)  Height: 5\' 8"  (1.727 m)    Wt Readings from Last 3 Encounters:  02/25/19 172 lb 9.6 oz (78.3 kg)  11/25/18 179 lb (81.2 kg)  10/14/18 179 lb (81.2 kg)   Physical Exam General appearance: alert, well developed, well nourished, cooperative and in no distress Head: Normocephalic, without obvious abnormality, atraumatic Respiratory: Respirations even and unlabored, normal respiratory rate Heart: rate and rhythm normal. No gallop or murmurs noted on exam  Abdomen: BS +, no distention, no rebound tenderness, or no mass Extremities: No gross deformities Skin: Skin color, texture, turgor normal. No rashes seen  Psych: Appropriate  mood and affect. Neurologic: Mental status: Alert, oriented to person, place, and time, thought content appropriate. Lab Results  Component Value Date   POCGLU 87 11/25/2018   POCGLU 98 10/14/2018   POCGLU 68 (A) 10/14/2018    Lab Results  Component Value Date   HGBA1C 7.4 12/03/2018    Assessment & Plan:  1. Diabetes mellitus type 2, uncontrolled, without complications ([place]) -Home readings improved. Continue reduced Basaglar 30 units daily. I - Glucose (CBG), 227 today non fasting  - Hemoglobin A1c; Future  2. Essential hypertension, well controlled We have discussed target BP range and blood pressure goal. I have advised patient to check BP regularly and to call us back or report to clinic if the  numbers are consistently higher than 140/90. We discussed the importance of compliance with medical therapy and DASH diet recommended, consequences of uncontrolled hypertension discussed.    Patient provided lab orders to obtain through [facility].   -The patient was given clear instructions to go to ER or return to medical center if symptoms do not improve, worsen or new problems develop. The patient verbalized understanding.    Joaquin CourtsKimberly Nishanth Mccaughan, FNP [facility] [address], [place] [id number] [phone]: [phone]

## 2019-03-11 ENCOUNTER — Other Ambulatory Visit: Payer: Self-pay | Admitting: Family Medicine

## 2019-03-11 NOTE — Telephone Encounter (Signed)
Please advise 

## 2019-03-11 NOTE — Telephone Encounter (Signed)
Last visit it is noted the patient is using 50units daily, most recent RX shows 30 units daily. Please authorize RX with the correct dosing information.

## 2019-03-13 ENCOUNTER — Ambulatory Visit (INDEPENDENT_AMBULATORY_CARE_PROVIDER_SITE_OTHER): Payer: BLUE CROSS/BLUE SHIELD | Admitting: *Deleted

## 2019-03-13 DIAGNOSIS — I480 Paroxysmal atrial fibrillation: Secondary | ICD-10-CM

## 2019-03-13 DIAGNOSIS — R001 Bradycardia, unspecified: Secondary | ICD-10-CM

## 2019-03-16 LAB — CUP PACEART REMOTE DEVICE CHECK
Battery Impedance: 1347 Ohm
Battery Remaining Longevity: 56 mo
Battery Voltage: 2.77 V
Brady Statistic AP VP Percent: 0 %
Brady Statistic AP VS Percent: 7 %
Brady Statistic AS VP Percent: 0 %
Brady Statistic AS VS Percent: 93 %
Date Time Interrogation Session: 20200621140829
Implantable Lead Implant Date: 20090127
Implantable Lead Implant Date: 20090127
Implantable Lead Location: 753859
Implantable Lead Location: 753860
Implantable Lead Model: 5076
Implantable Lead Model: 5076
Implantable Pulse Generator Implant Date: 20090127
Lead Channel Impedance Value: 494 Ohm
Lead Channel Impedance Value: 567 Ohm
Lead Channel Pacing Threshold Amplitude: 0.375 V
Lead Channel Pacing Threshold Amplitude: 0.75 V
Lead Channel Pacing Threshold Pulse Width: 0.4 ms
Lead Channel Pacing Threshold Pulse Width: 0.4 ms
Lead Channel Setting Pacing Amplitude: 2 V
Lead Channel Setting Pacing Amplitude: 2.5 V
Lead Channel Setting Pacing Pulse Width: 0.4 ms
Lead Channel Setting Sensing Sensitivity: 5.6 mV

## 2019-03-19 NOTE — Progress Notes (Signed)
Remote pacemaker transmission.   

## 2019-03-20 NOTE — Progress Notes (Signed)
[facility] Note  03/23/2019     CHIEF COMPLAINT Patient presents for Diabetic Eye Exam   HISTORY OF PRESENT ILLNESS: Theodore [name] is a 53 y.o. male who presents to the clinic today for:   HPI    Diabetic Eye Exam    Vision is stable.  Associated Symptoms Negative for Flashes, Blind Spot, Photophobia, Scalp Tenderness, Fever, Floaters, Pain, Glare, Jaw Claudication, Weight Loss, Distortion, Redness, Trauma, Shoulder/Hip pain and Fatigue.  Diabetes characteristics include Type 2, on insulin and taking oral medications.  This started 10 years ago.  Blood sugar level fluctuates.  Last Blood Glucose 89 (This am).  Last A1C 7.4 (Down from 9.3).  I, the attending physician,  performed the HPI with the patient and updated documentation appropriately.          Comments    Patient referred by Molli Barrows, FNP for diabetic retinal eval. Patient states A1c level down to 7.4 (was 9.3 a month prior to 7.4 reading). Vision seems to be doing OK, eyes get tired when looking at phone, playing games on phone.        Last edited by Bernarda Caffey, MD on 03/23/2019  9:58 AM. (History)    pt states his PCP sent him here for a DM exam, pts last eye dr was [facility], pt states he was not happy with them due to the glasses he was given, pt takes metformin twice a day, and 30 units of tresiba in the mornings, he states he was dx with diabetes in 2010, pt states he was seeing a dr prior to [employer], but he was hard to get in touch with, he states Kenton Kingfisher was able to get his A1c down from 9.3 to 7.2  Referring physician: Scot Jun, [address] [place],  [place] [address]  HISTORICAL INFORMATION:   Selected notes from the [facility] Referred by Molli Barrows, FNP for DM exam LEE:  Ocular Hx- PMH-anxiety, arthritis, depression, pacemaker, DM (A1c: 7.4 [03.20], takes metformin, basaglar)   CURRENT MEDICATIONS: No current  outpatient medications on file. (Ophthalmic Drugs)   No current facility-administered medications for this visit.  (Ophthalmic Drugs)   Current Outpatient Medications (Other)  Medication Sig  . ACCU-CHEK AVIVA PLUS test strip   . citalopram (CELEXA) 20 MG tablet Take 1 tablet (20 mg total) by mouth daily.  . Insulin Glargine (BASAGLAR KWIKPEN) 100 UNIT/ML SOPN INJECT 30 UNITS TOTAL INTO THE SKIN DAILY.  Marland Kitchen Insulin Pen Needle (B-D UF III MINI PEN NEEDLES) 31G X 5 MM MISC BD Ultra-Fine Short Pen Needle 31 gauge x 5/16"  FOR USE WITH INJECTION OF TRESIBA AND NOVOLOG  . lisinopril (PRINIVIL,ZESTRIL) 5 MG tablet Take 1 tablet (5 mg total) by mouth daily.  . metFORMIN (GLUCOPHAGE) 1000 MG tablet Take 1 tablet (1,000 mg total) by mouth 2 (two) times daily with a meal.  . gabapentin (NEURONTIN) 300 MG capsule TAKE 1 CAPSULE BY MOUTH TWICE A DAY (Patient not taking: Reported on 03/23/2019)   No current facility-administered medications for this visit.  (Other)      REVIEW OF SYSTEMS: ROS    Positive for: Endocrine, Cardiovascular, Eyes   Negative for: Constitutional, Gastrointestinal, Neurological, Skin, Genitourinary, Musculoskeletal, HENT, Respiratory, Psychiatric, Allergic/Imm, Heme/Lymph   Last edited by Roselee Nova D on 03/23/2019  9:22 AM. (History)       ALLERGIES Allergies  Allergen Reactions  . Cephalexin Hives  . Vancomycin  Hives, Itching and Other (See Comments)    True Allergic Reaction -- Not "Red Man's Syndrome Onset of prominent red rash, severe itching/hives Not responsive to Benadryl and IV rate reduction. First seen pre-op for surgery on 06/05/18.    PAST MEDICAL HISTORY Past Medical History:  Diagnosis Date  . Anxiety   . Arthritis   . Bradycardia   . Depression   . Diabetes mellitus    dx 2010  . Pacemaker    Permanent pacemaker placement by Dr. Molly Maduroobert McQueen--10/21/2007--a Medtronic Adapta--serial number #[id number]  . Syncope 09/2007   2D Echo--History  of frequent episodes of syncope beginning at 53 years of age--usually associated with some type of vagal issue but he does completely lose consciousness   Past Surgical History:  Procedure Laterality Date  . CARDIAC CATHETERIZATION Right 10/20/2007  . HAND SURGERY    . LUMBAR LAMINECTOMY/DECOMPRESSION MICRODISCECTOMY N/A 06/05/2018   Procedure: Lumbar decompression L4-S1;  Surgeon: Venita LickBrooks, Dahari, MD;  [facility];  Service: Orthopedics;  Laterality: N/A;  3 hrs  . PACEMAKER INSERTION  10/21/2007   Dr Jenne CampusMcQueen  . SHOULDER SURGERY  1999  . TONSILLECTOMY      FAMILY HISTORY Family History  Problem Relation Age of Onset  . Diabetes Sister   . Diabetes Mother   . Coronary artery disease Mother 6350's  . Hypertension Mother     SOCIAL HISTORY Social History   Tobacco Use  . Smoking status: Former Smoker    Quit date: 10/17/2007    Years since quitting: 11.4  . Smokeless tobacco: Former Engineer, waterUser  Substance Use Topics  . Alcohol use: No  . Drug use: No         OPHTHALMIC EXAM:  Base Eye Exam    Visual Acuity (Snellen - Linear)      Right Left   Dist [place] 20/20 -1 20/30   Dist ph [place] NI 20/25 +2   Correction: Glasses       Tonometry (Tonopen, 9:34 AM)      Right Left   Pressure 17 18       Pupils      Dark Light Shape React APD   Right 3 2 Round Brisk None   Left 3 2 Round Brisk None       Visual Fields (Counting fingers)      Left Right    Full Full       Extraocular Movement      Right Left    Full, Ortho Full, Ortho       Neuro/Psych    Oriented x3: Yes   Mood/Affect: Normal       Dilation    Both eyes: 1.0% Mydriacyl, 2.5% Phenylephrine @ 9:34 AM        Slit Lamp and Fundus Exam    Slit Lamp Exam      Right Left   Lids/Lashes Dermatochalasis - upper lid Dermatochalasis - upper lid   Conjunctiva/Sclera White and quiet White and quiet   Cornea mild Arcus mild Arcus   Anterior Chamber Deep and quiet Deep and quiet   Iris Round and moderately  dilated, No NVI Round and moderately dilated, No NVI   Lens 2+ Nuclear sclerosis, 1-2+ Cortical cataract 2+ Nuclear sclerosis, 1-2+ Cortical cataract   Vitreous mild Vitreous syneresis mild Vitreous syneresis       Fundus Exam      Right Left   Disc Compact Pink and Sharp   C/D Ratio 0.1 0.2   Macula  Flat, Blunted foveal reflex, mild RPE mottling and clumping, No heme or edema Flat, Blunted foveal reflex, mild RPE mottling and clumping, No heme or edema   Vessels mild Vascular attenuation Normal   Periphery Attached, scattered pigment changes, no heme Attached, pigmented [place] inferiorly, No heme         Refraction    Manifest Refraction      Sphere Cylinder Axis Dist VA   Right [place] +0.50 015 20/20   Left -1.00 +1.50 150 20/20-2          IMAGING AND PROCEDURES  Imaging and Procedures for @TODAY @  OCT, Retina - OU - Both Eyes       Right Eye Quality was good. Central Foveal Thickness: 287. Progression has no prior data. Findings include normal foveal contour, no IRF, no SRF, vitreomacular adhesion .   Left Eye Quality was good. Central Foveal Thickness: 293. Progression has no prior data. Findings include normal foveal contour, no IRF, no SRF, vitreomacular adhesion .   Notes *Images captured and stored on drive  Diagnosis / Impression:  NFP, no IRF/SRF OU No DME OU  Clinical management:  See below  Abbreviations: NFP - Normal foveal profile. CME - cystoid macular edema. PED - pigment epithelial detachment. IRF - intraretinal fluid. SRF - subretinal fluid. EZ - ellipsoid zone. ERM - epiretinal membrane. ORA - outer retinal atrophy. ORT - outer retinal tubulation. SRHM - subretinal hyper-reflective material                 ASSESSMENT/PLAN:    ICD-10-CM   1. Diabetes mellitus type 2 without retinopathy (HCC)  E11.9   2. Retinal edema  H35.81 OCT, Retina - OU - Both Eyes  3. Essential hypertension  I10   4. Hypertensive retinopathy of both eyes   H35.033   5. Combined forms of age-related cataract of both eyes  H25.813     1,2. Diabetes mellitus, type 2 without retinopathy OU  - DM2 under improved control under the expert care of Joaquin CourtsKimberly Harris, FNP  - A1c 7.4% on 3.11.2020 from 9.3% in January 2020  - The incidence, risk factors for progression, natural history and treatment options for diabetic retinopathy  were discussed with patient.    - The need for close monitoring of blood glucose, blood pressure, and serum lipids, avoiding cigarette or any type of tobacco, and the need for long term follow up was also discussed with patient.  - f/u in 1 year, sooner prn  3,4. Hypertensive retinopathy OU  - discussed importance of tight BP control  - monitor  5. Mixed form age related cataracts OU  - The symptoms of cataract, surgical options, and treatments and risks were discussed with patient.  - discussed diagnosis and progression  - not yet visually significant  - monitor for now   Ophthalmic Meds Ordered this visit:  No orders of the defined types were placed in this encounter.      Return in about 1 year (around 03/22/2020) for DM exam.  There are no Patient Instructions on file for this visit.   Explained the diagnoses, plan, and follow up with the patient and they expressed understanding.  Patient expressed understanding of the importance of proper follow up care.   This document serves as a record of services personally performed by Karie ChimeraBrian G. Kimarion Chery, MD, PhD. It was created on their behalf by Laurian BrimAmanda Brown, OA, an ophthalmic assistant. The creation of this record is the provider's dictation and/or activities during  the visit.    Electronically signed by: Laurian BrimAmanda Brown, OA  06.26.2020 5:51 PM    Karie ChimeraBrian G. Tylee Yum, M.D., Ph.D. Diseases & Surgery of the Retina and Vitreous [facility]  I have reviewed the above documentation for accuracy and completeness, and I agree with the above. Karie ChimeraBrian G. Myan Suit,  M.D., Ph.D. 03/23/19 5:51 PM     Abbreviations: M myopia (nearsighted); A astigmatism; H hyperopia (farsighted); P presbyopia; Mrx spectacle prescription;  CTL contact lenses; OD right eye; OS left eye; OU both eyes  XT exotropia; ET esotropia; PEK punctate epithelial keratitis; PEE punctate epithelial erosions; DES dry eye syndrome; MGD meibomian gland dysfunction; ATs artificial tears; PFAT's preservative free artificial tears; NSC nuclear sclerotic cataract; PSC posterior subcapsular cataract; ERM epi-retinal membrane; PVD posterior vitreous detachment; RD retinal detachment; DM diabetes mellitus; DR diabetic retinopathy; NPDR non-proliferative diabetic retinopathy; PDR proliferative diabetic retinopathy; CSME clinically significant macular edema; DME diabetic macular edema; dbh dot blot hemorrhages; CWS cotton wool spot; POAG primary open angle glaucoma; C/D cup-to-disc ratio; HVF humphrey visual field; GVF goldmann visual field; OCT optical coherence tomography; IOP intraocular pressure; BRVO Branch retinal vein occlusion; CRVO central retinal vein occlusion; CRAO central retinal artery occlusion; BRAO branch retinal artery occlusion; RT retinal tear; SB scleral buckle; PPV pars plana vitrectomy; VH Vitreous hemorrhage; PRP panretinal laser photocoagulation; IVK intravitreal kenalog; VMT vitreomacular traction; MH Macular hole;  NVD neovascularization of the disc; NVE neovascularization elsewhere; AREDS age related eye disease study; ARMD age related macular degeneration; POAG primary open angle glaucoma; EBMD epithelial/anterior basement membrane dystrophy; ACIOL anterior chamber intraocular lens; IOL intraocular lens; PCIOL posterior chamber intraocular lens; Phaco/IOL phacoemulsification with intraocular lens placement; PRK photorefractive keratectomy; LASIK laser assisted in situ keratomileusis; HTN hypertension; DM diabetes mellitus; COPD chronic obstructive pulmonary disease

## 2019-03-23 ENCOUNTER — Other Ambulatory Visit: Payer: Self-pay

## 2019-03-23 ENCOUNTER — Ambulatory Visit (INDEPENDENT_AMBULATORY_CARE_PROVIDER_SITE_OTHER): Payer: BC Managed Care – PPO | Admitting: Ophthalmology

## 2019-03-23 ENCOUNTER — Encounter (INDEPENDENT_AMBULATORY_CARE_PROVIDER_SITE_OTHER): Payer: Self-pay | Admitting: Ophthalmology

## 2019-03-23 DIAGNOSIS — H35033 Hypertensive retinopathy, bilateral: Secondary | ICD-10-CM

## 2019-03-23 DIAGNOSIS — I10 Essential (primary) hypertension: Secondary | ICD-10-CM | POA: Diagnosis not present

## 2019-03-23 DIAGNOSIS — H3581 Retinal edema: Secondary | ICD-10-CM | POA: Diagnosis not present

## 2019-03-23 DIAGNOSIS — E119 Type 2 diabetes mellitus without complications: Secondary | ICD-10-CM | POA: Diagnosis not present

## 2019-03-23 DIAGNOSIS — H25813 Combined forms of age-related cataract, bilateral: Secondary | ICD-10-CM

## 2019-06-15 ENCOUNTER — Encounter: Payer: BLUE CROSS/BLUE SHIELD | Admitting: *Deleted

## 2019-06-23 ENCOUNTER — Encounter: Payer: Self-pay | Admitting: Cardiology

## 2019-06-26 ENCOUNTER — Other Ambulatory Visit: Payer: Self-pay | Admitting: Physician Assistant

## 2019-06-26 DIAGNOSIS — M25512 Pain in left shoulder: Secondary | ICD-10-CM

## 2019-06-28 ENCOUNTER — Other Ambulatory Visit: Payer: Self-pay | Admitting: Family Medicine

## 2019-07-09 ENCOUNTER — Ambulatory Visit
Admission: RE | Admit: 2019-07-09 | Discharge: 2019-07-09 | Disposition: A | Discharge status: Home / Self Care | Payer: BC Managed Care – PPO | Source: Ambulatory Visit | Attending: Physician Assistant | Admitting: Physician Assistant

## 2019-07-09 ENCOUNTER — Other Ambulatory Visit: Payer: Self-pay

## 2019-07-09 DIAGNOSIS — M25512 Pain in left shoulder: Secondary | ICD-10-CM

## 2019-07-09 MED ORDER — IOPAMIDOL (ISOVUE-M 200) INJECTION 41%
12.0000 mL | Freq: Once | INTRAMUSCULAR | Status: AC
  Administered 2019-07-09: 12 mL via INTRA_ARTICULAR

## 2019-07-18 ENCOUNTER — Other Ambulatory Visit: Payer: Self-pay | Admitting: Family Medicine

## 2019-09-10 IMAGING — CR DG LUMBAR SPINE 1V
1 series · 1 of 1 positions shown · non-contrast
Comparison: None.

CLINICAL DATA: Localization image for Lumbar Decompression L4-S1
for Lumbar Spinal Stenosis with Neurogenic Claudication.

EXAM:
LUMBAR SPINE - 1 VIEW

[lateral]
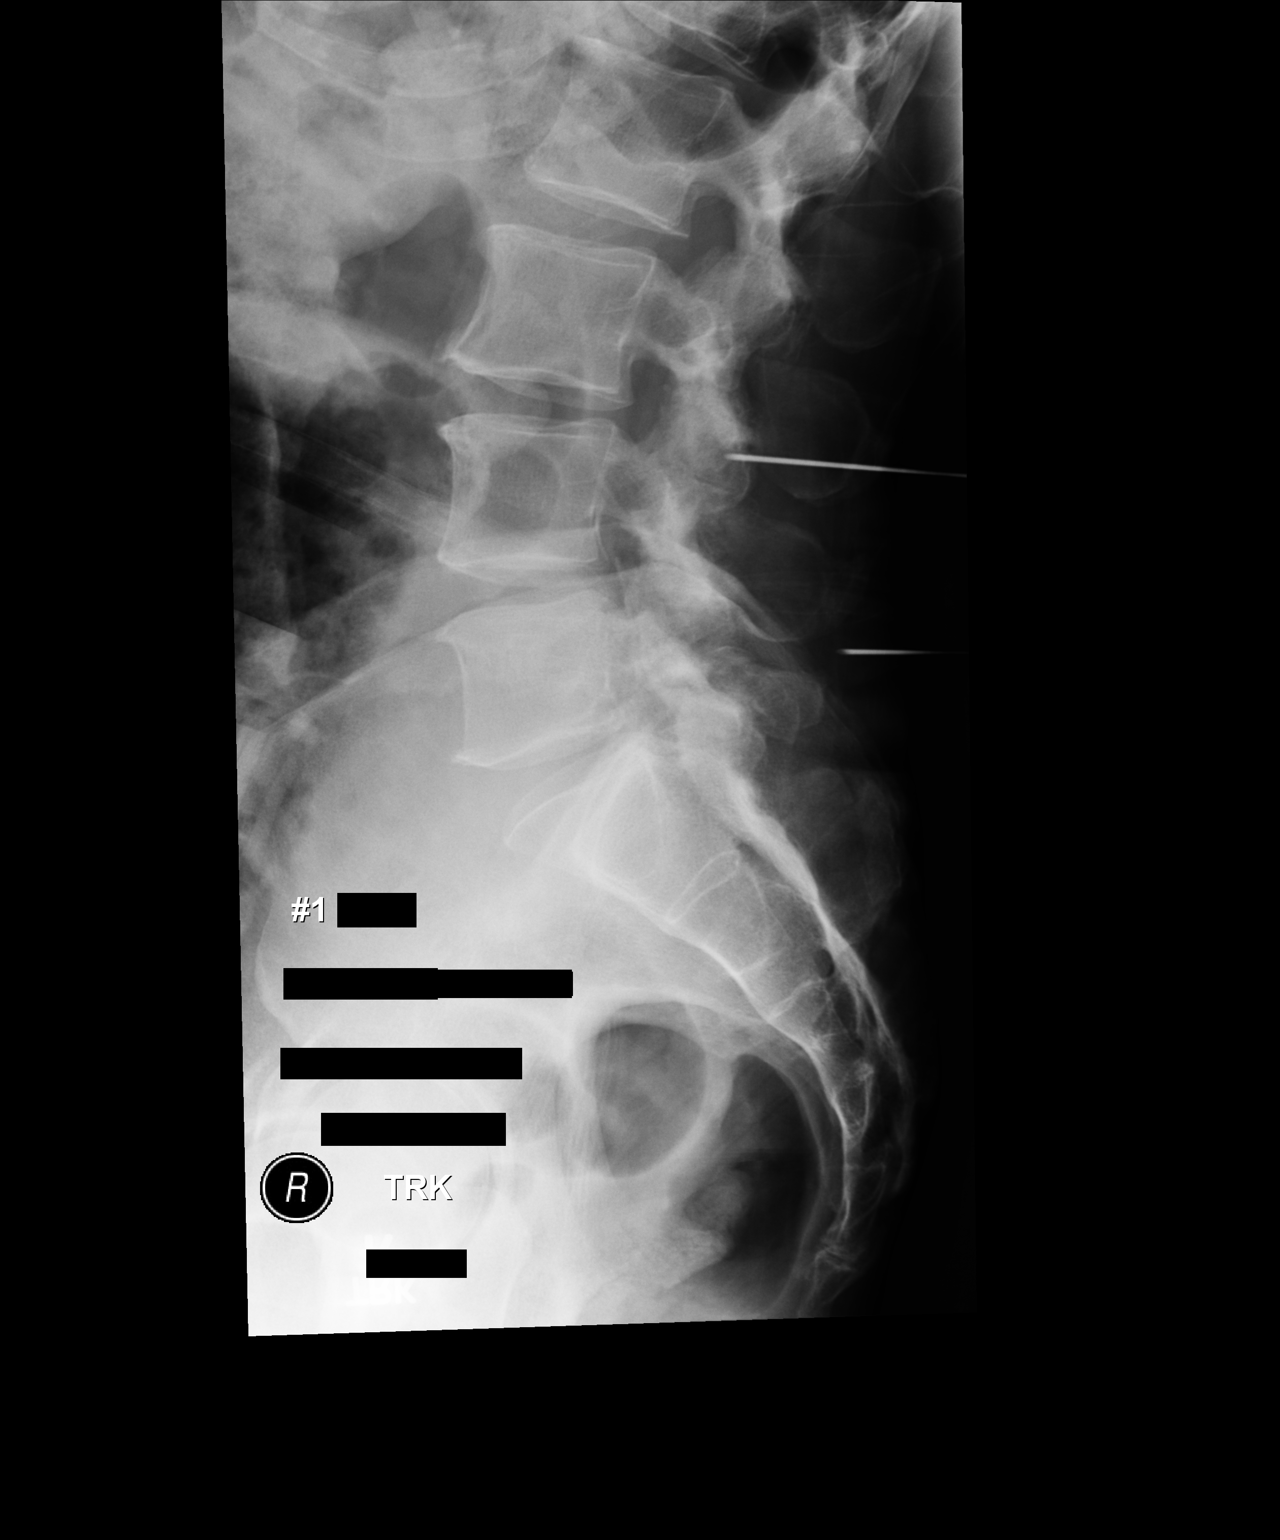

[1 of 1 positions shown; findings below may reference images not displayed]

FINDINGS: Intraoperative cross-table lateral film of the lumbar spine is
provided. Surgical overlies the posterior elements at the L3-4 disc
space level. Additional surgical probe is directed towards the
posterior elements at the L5 vertebral body level.
IMPRESSION: 1. Surgical probe overlying the posterior elements at the L3-4 disc
space level.
2. Additional surgical probe overlying the soft tissues posterior to
the posterior elements at the L5 level.

## 2019-09-10 IMAGING — CR DG SPINE 1V PORT
1 series · 1 of 1 positions shown · non-contrast
Comparison: CT lumbar myelogram dated 05/02/2018

CLINICAL DATA: Localization L4-S1 decompression for lumbar spinal
stenosis with neurogenic claudication.

EXAM:
PORTABLE SPINE - 1 VIEW

[AP]
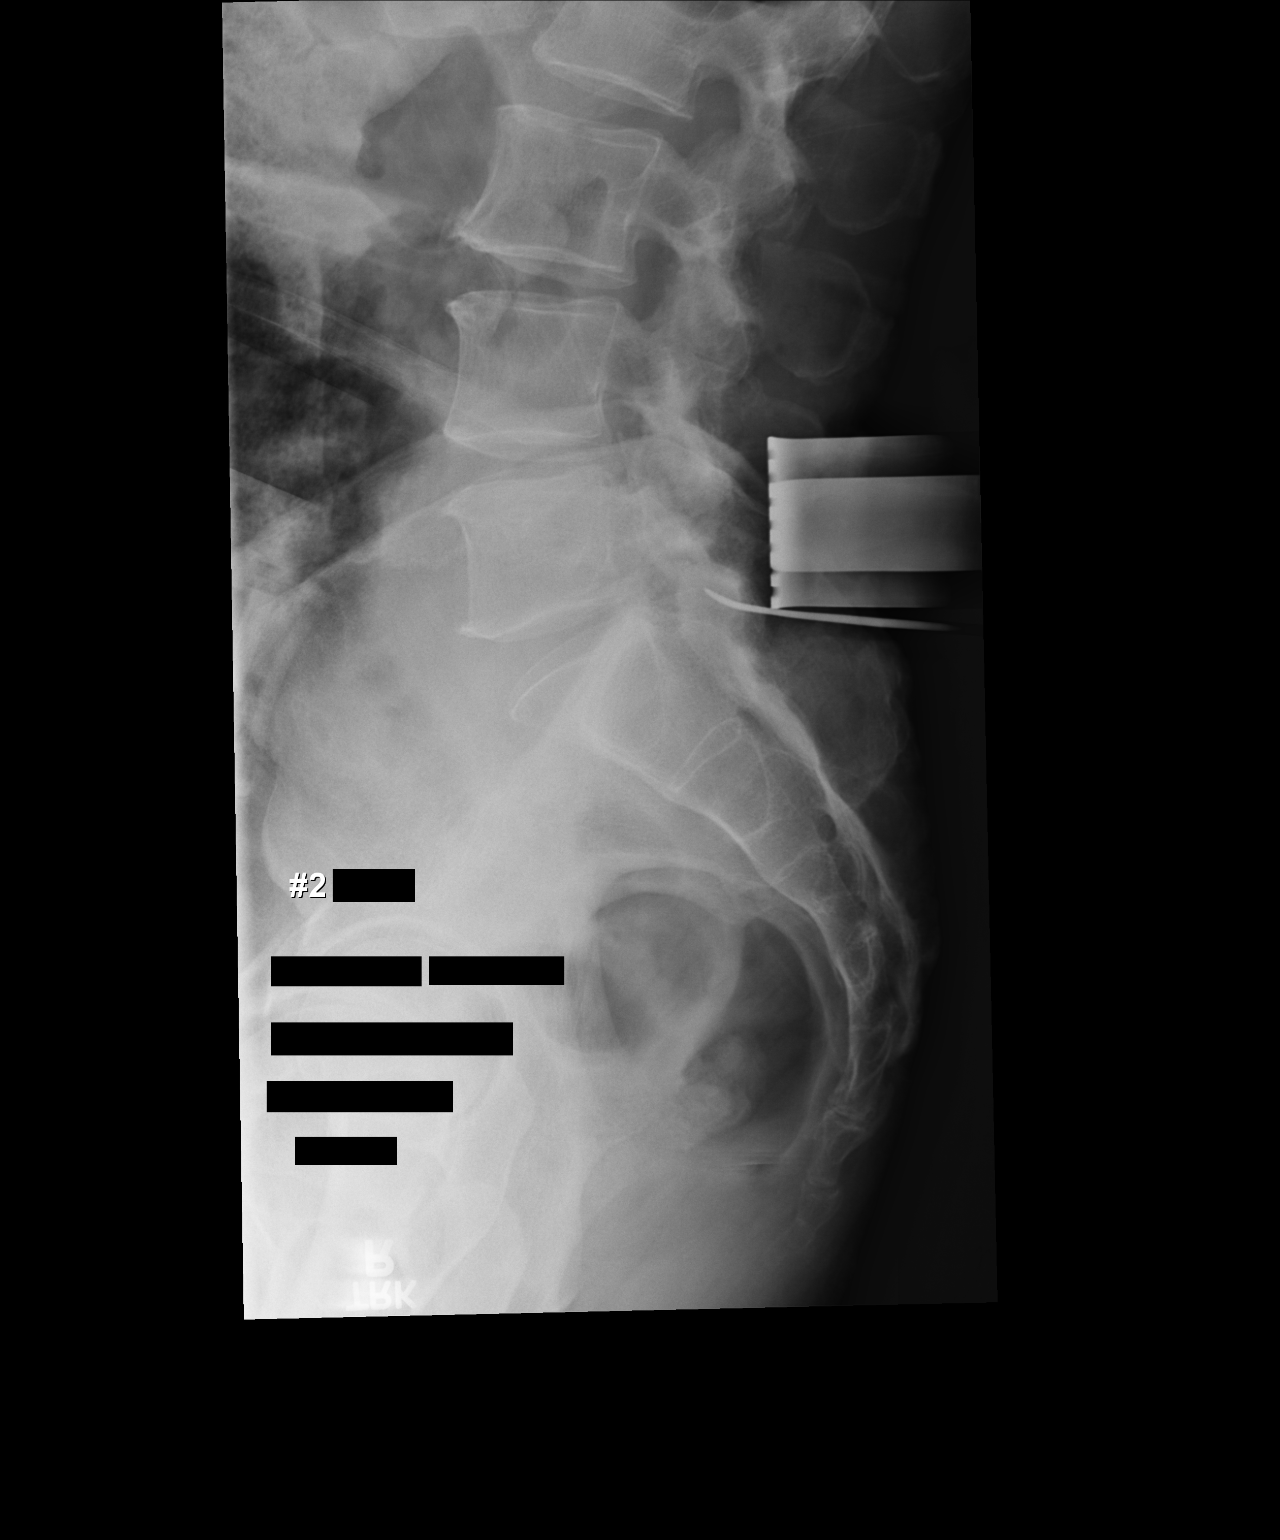

[1 of 1 positions shown; findings below may reference images not displayed]

FINDINGS: Intraoperative lateral view of the lumbosacral spine. Based on the
numbering on the CT myelogram, surgical probe overlies the posterior
elements at the level of the L5-S1 disc space.
IMPRESSION: Surgical probe overlying the posterior elements at the level of the
L5-S1 disc space.

## 2019-11-05 ENCOUNTER — Telehealth: Payer: Self-pay | Admitting: Family Medicine

## 2019-11-05 MED ORDER — ACCU-CHEK AVIVA PLUS VI STRP: ORAL_STRIP | 0 refills | Status: DC

## 2019-11-05 NOTE — Telephone Encounter (Signed)
Pt wants test strips.

## 2019-11-05 NOTE — Telephone Encounter (Signed)
Attempted to call patient to reschedule appointment on 11/25/2019. No VM set up.

## 2019-11-05 NOTE — Telephone Encounter (Signed)
Refill sent.

## 2019-11-25 ENCOUNTER — Ambulatory Visit: Payer: BC Managed Care – PPO | Admitting: Internal Medicine

## 2019-11-25 ENCOUNTER — Telehealth: Payer: Self-pay

## 2019-11-25 NOTE — Telephone Encounter (Signed)
Called patient to do their pre-visit COVID screening.  Call went to voicemail, which was full. Unable to do prescreening.  

## 2019-11-25 NOTE — Patient Instructions (Signed)
**Note Theodore-Identified via Obfuscation** Thank you for choosing Primary Care at [facility] to be your medical home!    Theodore [name] was seen by Theodore Hollingshead, DO today.   Theodore [name] primary care provider is Theodore Siren, DO.   For the best care possible, you should try to see Theodore Siren, DO whenever you come to the clinic.   We look forward to seeing you again soon!  If you have any questions about your visit today, please call us at [phone] or feel free to [name] your primary care provider via MyChart.

## 2019-11-26 ENCOUNTER — Other Ambulatory Visit: Payer: Self-pay

## 2019-11-26 ENCOUNTER — Encounter: Payer: Self-pay | Admitting: Internal Medicine

## 2019-11-26 ENCOUNTER — Ambulatory Visit (INDEPENDENT_AMBULATORY_CARE_PROVIDER_SITE_OTHER): Payer: BC Managed Care – PPO | Admitting: Internal Medicine

## 2019-11-26 VITALS — BP 119/79 | HR 80 | Temp 97.5°F | Resp 17 | Ht 68.0 in | Wt 166.0 lb

## 2019-11-26 DIAGNOSIS — Z794 Long term (current) use of insulin: Secondary | ICD-10-CM

## 2019-11-26 DIAGNOSIS — I10 Essential (primary) hypertension: Secondary | ICD-10-CM | POA: Diagnosis not present

## 2019-11-26 DIAGNOSIS — Z13228 Encounter for screening for other metabolic disorders: Secondary | ICD-10-CM

## 2019-11-26 DIAGNOSIS — Z1211 Encounter for screening for malignant neoplasm of colon: Secondary | ICD-10-CM | POA: Diagnosis not present

## 2019-11-26 DIAGNOSIS — E1159 Type 2 diabetes mellitus with other circulatory complications: Secondary | ICD-10-CM | POA: Diagnosis not present

## 2019-11-26 DIAGNOSIS — Z114 Encounter for screening for human immunodeficiency virus [HIV]: Secondary | ICD-10-CM

## 2019-11-26 LAB — POCT GLYCOSYLATED HEMOGLOBIN (HGB A1C): Hemoglobin A1C: 11.6 % — AB (ref 4.0–5.6)

## 2019-11-26 NOTE — Progress Notes (Signed)
Subjective:    Theodore [name] - 54 y.o. male MRN [id number]  Date of birth: [birth year]  HPI  Theodore [name] is here for follow up of chronic medical conditions.   Diabetes mellitus, Type 2 Disease Monitoring             Blood Sugar Ranges: Fasting - Went a long time without test strips. This morning was 72. Yesterday was 203 (ate pizza the night before). Day before that was 100s.              Polyuria: no              Visual problems: yes   Urine Microalbumin Performed today. Patient takes Lisinopril 5 mg.   Last A1C: 7.4 (March 2020)   Medications: Basaglar 30u daily, Metformin 1000 mg BID  Medication Compliance: no, forgets Metformin night dose 75% of the time   Medication Side Effects             Hypoglycemia: no   Preventitive Health Care             Eye Exam: UTD              Foot Exam: Done today    Chronic HTN Disease Monitoring:  Home BP Monitoring - Doesn't monitor frequently but has a cuff at home if needed.  Chest pain- no  Dyspnea- no Headache - no  Medications: Lisinopril 5 mg  Compliance- yes Lightheadedness- no  Edema- no   Preventitive Healthcare:  Exercise: no   Diet Pattern: Reports he eats too much and snacks a lot.   Salt Restriction: Yes      Health Maintenance:  Health Maintenance Due  Topic Date Due  . FOOT EXAM  12/06/1975  . HIV Screening  12/05/1980  . COLONOSCOPY  12/06/2015  . INFLUENZA VACCINE  04/25/2019  . HEMOGLOBIN A1C  06/05/2019    -  reports that he quit smoking about 12 years ago. He has quit using smokeless tobacco. - Review of Systems: Per HPI. - Past Medical History: Patient Active Problem List   Diagnosis Date Noted  . Essential hypertension 11/26/2019  . Neurogenic orthostatic hypotension (HCC) 06/06/2018  . Status post lumbar spine surgery for decompression of spinal cord 06/05/2018  . Paroxysmal atrial fibrillation (HCC) 12/12/2017  . Diabetes mellitus type 2 in nonobese (HCC) 12/12/2017   . Neurocardiogenic syncope 07/23/2014  . Pacemaker 07/23/2014  . Tachycardia 07/23/2014  . Diabetes mellitus type 2, uncontrolled, without complications 10/20/2011  . Sinoatrial bradycardia 10/20/2011  . Depression with anxiety 10/20/2011   - Medications: reviewed and updated   Objective:   Physical Exam BP 119/79   Pulse 80   Temp (!) 97.5 F (36.4 C) (Temporal)   Resp 17   Ht 5\' 8"  (1.727 m)   Wt 166 lb (75.3 kg)   SpO2 95%   BMI 25.24 kg/m  Physical Exam  Constitutional: He is oriented to person, place, and time and well-developed, well-nourished, and in no distress. No distress.  HENT:  Head: Normocephalic and atraumatic.  Eyes: Conjunctivae and EOM are normal.  Cardiovascular: Normal rate, regular rhythm and normal heart sounds.  No murmur heard. Pulmonary/Chest: Effort normal and breath sounds normal. No respiratory distress.  Musculoskeletal:        General: Normal range of motion.     Comments: Diabetic Foot Check -  Appearance - no lesions, ulcers or calluses Skin - no unusual pallor or redness Monofilament testing - normal bilaterally  Right - Great toe, medial, central, lateral ball and posterior foot intact Left - Great toe, medial, central, lateral ball and posterior foot intact   Neurological: He is alert and oriented to person, place, and time.  Skin: Skin is warm and dry. He is not diaphoretic.  Psychiatric: Affect and judgment normal.           Assessment & Plan:   1. Type 2 diabetes mellitus with other circulatory complication, with long-term current use of insulin (HCC) A1c significantly elevated to 11.6%. Likely related to lack of exercise, poor diet, and lack of compliance with second dose of Metformin. Offered for patient to see pharmacist Lurena Joiner at [facility] and he declined. Also discussed adding another diabetic agent, and he would like to avoid for now. Stressed importance of carb modified diet and exercise. Also discussed setting phone alarm for  second Metformin dose or placing it in a more visible area/using pill box/etc.  Counseled on Diabetic diet, my plate method, 081 minutes of moderate intensity exercise/week Blood sugar logs with fasting goals of 80-120 mg/dl, random of less than 180 and in the event of sugars less than 60 mg/dl or greater than 400 mg/dl encouraged to notify the clinic. Advised on the need for annual eye exams, annual foot exams, Pneumonia vaccine. - HgB A1c - Comprehensive metabolic panel; Future - Microalbumin/Creatinine Ratio, Urine; Future - HM DIABETES FOOT EXAM  2. Essential hypertension At goal today.  Counseled on blood pressure goal of less than 130/80, low-sodium, DASH diet, medication compliance, 150 minutes of moderate intensity exercise per week. Discussed medication compliance, adverse effects. - Comprehensive metabolic panel; Future - CBC with Differential; Future  3. Colon cancer screening - Ambulatory referral to Gastroenterology  4. Screening for metabolic disorder - Comprehensive metabolic panel; Future - CBC with Differential; Future - Lipid panel; Future  5. Screening for HIV (human immunodeficiency virus) - HIV Antibody (routine testing w rflx); Future    [name], D.O. 11/26/2019, 3:59 PM Primary Care at [facility]

## 2019-11-27 ENCOUNTER — Other Ambulatory Visit: Payer: Self-pay | Admitting: Family Medicine

## 2019-11-30 MED ORDER — BD PEN NEEDLE MINI U/F 31G X 5 MM MISC: 1 refills | Status: AC

## 2019-11-30 MED ORDER — LISINOPRIL 5 MG PO TABS: 5.0000 mg | ORAL_TABLET | Freq: Every day | ORAL | 1 refills | Status: DC

## 2019-11-30 MED ORDER — METFORMIN HCL 1000 MG PO TABS: 1000.0000 mg | ORAL_TABLET | Freq: Two times a day (BID) | ORAL | 1 refills | Status: DC

## 2019-12-08 LAB — CBC WITH DIFFERENTIAL/PLATELET
Basophils Absolute: 30
Basophils, %: 0.7
Eosinophils Absolute: 181
Eosinophils, %: 4.2
HCT: 45 — AB (ref 29–41)
Hemoglobin: 15.3
Lymphocytes Absolute: 1342 /uL
Lymphocytes: 31.2
MCH: 33.1
MCHC: 33.9
MCV: 97.6 (ref 76–111)
MPV: 11.2 fL (ref 7.5–11.5)
Monocytes Absolute: 516
Monocytes: 12
Neutrophils Absolute: 2232 /uL
Neutrophils: 51.9
RBC count: 4.62
RDW: 13.1
WBC Count: 4.3
platelet count: 138

## 2019-12-08 LAB — HIV ANTIBODY (ROUTINE TESTING W REFLEX): HIV 1/2 Antibodies: NONREACTIVE

## 2019-12-08 LAB — LIPID PANEL
Cholesterol, Total: 150
HDL Cholesterol: 38 (ref 35–70)
Total CHOL/HDL Ratio: 3.9
Triglycerides: 78 (ref 40–160)

## 2019-12-08 LAB — MICROALBUMIN / CREATININE URINE RATIO
Albumin ELP, Urine: 1.1
Albumin/Creatinine Ratio, Urine, POC: 8
Creatinine, Ur: 144 mg/dL

## 2019-12-11 ENCOUNTER — Ambulatory Visit (INDEPENDENT_AMBULATORY_CARE_PROVIDER_SITE_OTHER): Payer: BC Managed Care – PPO | Admitting: *Deleted

## 2019-12-11 DIAGNOSIS — R001 Bradycardia, unspecified: Secondary | ICD-10-CM

## 2019-12-11 LAB — CUP PACEART REMOTE DEVICE CHECK
Battery Impedance: 1602 Ohm
Battery Remaining Longevity: 48 mo
Battery Voltage: 2.77 V
Brady Statistic AP VP Percent: 0 %
Brady Statistic AP VS Percent: 6 %
Brady Statistic AS VP Percent: 0 %
Brady Statistic AS VS Percent: 94 %
Date Time Interrogation Session: 20210318210333
Implantable Lead Implant Date: 20090127
Implantable Lead Implant Date: 20090127
Implantable Lead Location: 753859
Implantable Lead Location: 753860
Implantable Lead Model: 5076
Implantable Lead Model: 5076
Implantable Pulse Generator Implant Date: 20090127
Lead Channel Impedance Value: 473 Ohm
Lead Channel Impedance Value: 567 Ohm
Lead Channel Pacing Threshold Amplitude: 0.5 V
Lead Channel Pacing Threshold Amplitude: 0.75 V
Lead Channel Pacing Threshold Pulse Width: 0.4 ms
Lead Channel Pacing Threshold Pulse Width: 0.4 ms
Lead Channel Setting Pacing Amplitude: 2 V
Lead Channel Setting Pacing Amplitude: 2.5 V
Lead Channel Setting Pacing Pulse Width: 0.4 ms
Lead Channel Setting Sensing Sensitivity: 5.6 mV

## 2019-12-11 NOTE — Progress Notes (Signed)
PPM Remote  

## 2019-12-15 ENCOUNTER — Telehealth: Payer: Self-pay | Admitting: *Deleted

## 2019-12-15 NOTE — Telephone Encounter (Signed)
   Primary Cardiologist:Mihai Croitoru, MD  Chart reviewed as part of pre-operative protocol coverage. Patient has not been seen since 11/2017. Because of Theodore [name] past medical history and time since last visit, he/she will require a follow-up visit in order to better assess preoperative cardiovascular risk.  Pre-op covering staff: - Please schedule appointment and call patient to inform them. - Please contact requesting surgeon's office via preferred method (i.e, phone, fax) to inform them of need for appointment prior to surgery.   Theodore Parker, PA-C  12/15/2019, 1:57 PM

## 2019-12-15 NOTE — Telephone Encounter (Signed)
   [facility] Pre-operative Risk Assessment    Request for surgical clearance:  1. What type of surgery is being performed? LEFT SHOULDER SCOPE ASAD POSSIBLE BITENODESIS   2. When is this surgery scheduled? 01/04/20   3. What type of clearance is required (medical clearance vs. Pharmacy clearance to hold med vs. Both)? MEDICAL  4. Are there any medications that need to be held prior to surgery and how long? NONE LISTED   5. Practice name and name of physician performing surgery? [facility]; DR. Remo Lipps NORRIS   6. What is your office phone number [phone]    7.   What is your office fax number [phone]  8.   Anesthesia type (None, local, MAC, general) ?  GENERAL   Julaine Hua 12/15/2019, 12:53 PM  _________________________________________________________________   (provider comments below)

## 2019-12-15 NOTE — Telephone Encounter (Signed)
Pt is agreeable to plan of care for pre op clearance appt . I will send clearance notes to Joni Reining, DNP. I will remove from the pre op call back pool.

## 2019-12-16 ENCOUNTER — Ambulatory Visit (INDEPENDENT_AMBULATORY_CARE_PROVIDER_SITE_OTHER): Payer: BC Managed Care – PPO | Admitting: Adult Health

## 2019-12-16 ENCOUNTER — Encounter: Payer: Self-pay | Admitting: Adult Health

## 2019-12-16 ENCOUNTER — Other Ambulatory Visit: Payer: Self-pay

## 2019-12-16 VITALS — BP 112/68 | HR 88 | Ht 68.0 in | Wt 167.0 lb

## 2019-12-16 DIAGNOSIS — Z950 Presence of cardiac pacemaker: Secondary | ICD-10-CM | POA: Diagnosis not present

## 2019-12-16 DIAGNOSIS — I10 Essential (primary) hypertension: Secondary | ICD-10-CM | POA: Diagnosis not present

## 2019-12-16 DIAGNOSIS — Z01818 Encounter for other preprocedural examination: Secondary | ICD-10-CM

## 2019-12-16 DIAGNOSIS — E119 Type 2 diabetes mellitus without complications: Secondary | ICD-10-CM

## 2019-12-16 NOTE — Progress Notes (Signed)
Thanks, Samara Deist. Low risk for shoulder surgery. He is not pacemaker dependent so no device reprogramming is needed.  (In pacemaker dependent patients, the device would need to be reprogrammed DOO (non-sensing mode), to prevent inappropriate device inhibition by electrocautery close to the device).

## 2019-12-16 NOTE — Patient Instructions (Signed)
Medication Instructions:  Continue current medications  *If you need a refill on your cardiac medications before your next appointment, please call your pharmacy*   Lab Work: None Ordered   Testing/Procedures: None Ordered   Follow-Up: At [facility], you and your health needs are our priority.  As part of our continuing mission to provide you with exceptional heart care, we have created designated Provider Care Teams.  These Care Teams include your primary Cardiologist (physician) and Advanced Practice Providers (APPs -  Physician Assistants and Nurse Practitioners) who all work together to provide you with the care you need, when you need it.  We recommend signing up for the patient portal called "MyChart".  Sign up information is provided on this After Visit Summary.  MyChart is used to connect with patients for Virtual Visits (Telemedicine).  Patients are able to view lab/test results, encounter notes, upcoming appointments, etc.  Non-urgent messages can be sent to your provider as well.   To learn more about what you can do with MyChart, go to [email or web address].    Your next appointment:   6 month(s)  The format for your next appointment:   In Person  Provider:   You may see Mihai Croitoru, MD or one of the following Advanced Practice Providers on your designated Care Team:    Hao Meng, PA-C  Angela Duke, PA-C or   Krista Kroeger, PA-C     

## 2019-12-16 NOTE — Progress Notes (Signed)
Cardiology Office Note   Date:  12/16/2019   ID:  Theodore [name], DOB Jan 01, 1966, MRN [id number]  PCP:  Nicolette Bang, DO  Cardiologist:  Dr. Sallyanne Kuster  CC: Pre-Op Clearance Rotator Cuff Surgery    History of Present Illness: Theodore [name] is for procedures whether it be 54 y.o. male who presents for ongoing assessment and management of syncope with history of implantation of dual-chamber pacemaker in 2009 secondary to prolonged sinus pauses. Cardiac cath in 2009 with normal coronary arteries.  On last office visit 12/12/2017 his pacemaker recorded an episode of asymptomatic atrial fibrillation that occurred on December 18 and lasted for over 19 hours.  He was asymptomatic. He was to be on ASA only.  At that office visit the patient denied any chest pain at rest or with exertion dyspnea orthopnea PND or palpitations.  Per Dr. Sallyanne Kuster note:  Theodore [name]'s triggers for syncope seem to be consistently related to the observation or discussion of health problems and medical procedures. For example he once passed out watching a person wearing oxygen mask and another time passed out discussing a newly diagnosed lung cancer in his uncle. His son has syncope.  He is due to have left shoulder surgery on January 04, 2020 with Dr. Vicenta Dunning.  He is here for preoperative evaluation.  He is without any complaints today.  He remains active.  Since being seen last he did have L4-L5 repair in September 2019 and has done well with that.  He is back to work.  He denies any syncope palpitations chest pain or dyspnea on exertion.  Past Medical History:  Diagnosis Date  . Anxiety   . Arthritis   . Bradycardia   . Depression   . Diabetes mellitus    dx 2010  . Pacemaker    Permanent pacemaker placement by Dr. Herbie Baltimore McQueen--10/21/2007--a Medtronic Adapta--serial number #[id number]  . Syncope 09/2007   2D Echo--History of frequent episodes of syncope beginning at 54 years of age--usually  associated with some type of vagal issue but he does completely lose consciousness    Past Surgical History:  Procedure Laterality Date  . CARDIAC CATHETERIZATION Right 10/20/2007  . HAND SURGERY    . LUMBAR LAMINECTOMY/DECOMPRESSION MICRODISCECTOMY N/A 06/05/2018   Procedure: Lumbar decompression L4-S1;  Surgeon: Melina Schools, MD;  Location: [facility];  Service: Orthopedics;  Laterality: N/A;  3 hrs  . PACEMAKER INSERTION  10/21/2007   Dr Tami Ribas  . SHOULDER SURGERY  1999  . TONSILLECTOMY       Current Outpatient Medications  Medication Sig Dispense Refill  . ACCU-CHEK AVIVA PLUS test strip Check blood sugars twice a day. Dx: E11.59, Z79.4 100 each 0  . citalopram (CELEXA) 20 MG tablet Take 1 tablet (20 mg total) by mouth daily. 90 tablet 4  . Insulin Glargine (BASAGLAR KWIKPEN) 100 UNIT/ML INJECT 30 UNITS TOTAL INTO THE SKIN DAILY. 15 mL 1  . Insulin Pen Needle (B-D UF III MINI PEN NEEDLES) 31G X 5 MM MISC BD Ultra-Fine Short Pen Needle 31 gauge x 5/16"  Use with Basaglar Kwikpen 200 each 1  . lisinopril (ZESTRIL) 5 MG tablet Take 1 tablet (5 mg total) by mouth daily. 90 tablet 1  . metFORMIN (GLUCOPHAGE) 1000 MG tablet Take 1 tablet (1,000 mg total) by mouth 2 (two) times daily with a meal. 180 tablet 1   No current facility-administered medications for this visit.    Allergies:   Cephalexin and Vancomycin    Social History:  The  patient  reports that he quit smoking about 12 years ago. He has quit using smokeless tobacco. He reports that he does not drink alcohol or use drugs.   Family History:  The patient's family history includes Coronary artery disease (age of onset: 52's) in his mother; Diabetes in his mother and sister; Hypertension in his mother.    ROS: All other systems are reviewed and negative. Unless otherwise mentioned in H&P    PHYSICAL EXAM: VS:  BP 112/68   Pulse 88   Ht 5\' 8"  (1.727 m)   Wt 167 lb (75.8 kg)   BMI 25.39 kg/m  , BMI Body mass index is  25.39 kg/m. GEN: Well nourished, well developed, in no acute distress HEENT: normal Neck: no JVD, carotid bruits, or masses Cardiac: RRR; no murmurs, rubs, or gallops,no edema  Respiratory:  Clear to auscultation bilaterally, normal work of breathing GI: soft, nontender, nondistended, + BS MS: no deformity or atrophy, unable to lift left shoulder in the lateral position. Skin: warm and dry, no rash Neuro:  Strength and sensation are intact Psych: euthymic mood, full affect   EKG: Normal sinus rhythm heart rate of 88 bpm  Recent Labs: No results found for requested labs within last 8760 hours.    Lipid Panel    Component Value Date/Time   CHOL 138 10/22/2011 0507   TRIG 359 (H) 10/22/2011 0507   HDL 26 (L) 10/22/2011 0507   CHOLHDL 5.3 10/22/2011 0507   VLDL 72 (H) 10/22/2011 0507   LDLCALC 40 10/22/2011 0507      Wt Readings from Last 3 Encounters:  12/16/19 167 lb (75.8 kg)  11/26/19 166 lb (75.3 kg)  02/25/19 172 lb 9.6 oz (78.3 kg)      Other studies Reviewed: See last cardiac catheterization scanned.   ASSESSMENT AND PLAN:  1. Pre-Operative Evaluation:   Chart reviewed as part of pre-operative protocol coverage. Given past medical history and time since last visit, based on ACC/AHA guidelines, Theodore [name] would be at acceptable risk for the planned procedure without further cardiovascular testing. No changes in his medications.   Current medicines are reviewed at length with the patient today.  I have spent 25 minutes, dedicated to the care of this patient on the date of this encounter to include pre-visit review of records, assessment, management and diagnostic testing,with shared decision making.  2. Hypertension: Excellent control of BP on current regimen. No changes. He just had labs per PCP.  3. Symptomatic Bradycardia: Syncope. He has PPM in situ. He will have ongoing interrogation of PPM per protocol. No issues with left shoulder surgery concerning  indwelling pacemaker. For further questions please contact Dr.Croitoru.   Labs/ tests ordered today include: None-will continue PPM interrogation per protocol.   [name]. Bettey Mare, ANP, [facility]   12/16/2019 8:47 AM    [facility] [address] Office [phone] Fax [phone]  Notice: This dictation was prepared with Dragon dictation along with smaller phrase technology. Any transcriptional errors that result from this process are unintentional and may not be corrected upon review.

## 2019-12-21 ENCOUNTER — Telehealth: Payer: Self-pay | Admitting: Internal Medicine

## 2019-12-21 NOTE — Telephone Encounter (Signed)
Patient came in Friday 12/18/2019 around 5pm. Patient states he was called to set up a colonoscopy, but he had one 4 years ago. When he had the last one they said he did not need one within the next 10 years. Patient is wanting feedback on why he needs another one and would like to be contacted with information.  Please contact patient.

## 2019-12-21 NOTE — Telephone Encounter (Signed)
Patient needs an appointment for surgical clearance.

## 2019-12-21 NOTE — Telephone Encounter (Signed)
Please return patient's call regarding A1c and surgical clearance. Patient will need a visit for surgical clearance form to be completed. In terms of lowering A1c, he should work on increasing exercise and decreasing amount of carbs/sugar in diet. Adherence to insulin and Metformin is very important.   Marcy Siren, D.O. Primary Care at [facility]  12/21/2019, 3:29 PM

## 2019-12-21 NOTE — Telephone Encounter (Signed)
Referral was ordered because there is no Colonoscopy on file.  Referral can be closed if he brings Colonoscopy report or gives you an office name so that you can request the report.

## 2019-12-21 NOTE — Telephone Encounter (Signed)
Patient needs surgical clearance for shoulder surgery due to A1C being higher. Patient would like to know if there is anything he can do to drop it down, or have you complete a surgical clearance form.  Please contact patient.

## 2019-12-21 NOTE — Telephone Encounter (Signed)
We also haven't received any labs from [facility]. He was supposed to go to get labs done after his appointment since insurance won't cover labwork through [facility].

## 2019-12-29 ENCOUNTER — Telehealth: Payer: Self-pay | Admitting: Internal Medicine

## 2019-12-29 NOTE — Telephone Encounter (Signed)
Error message when attempting to call wife's phone.

## 2019-12-29 NOTE — Telephone Encounter (Signed)
I put patient on your schedule for tomorrow afternoon for a phone visit.  I did go over the dosing increase of the Basaglar  Patient states that his fasting readings are in the 60s-90s. His readings after breakfast & lunch are in the 220s-300s. He says that he only drinks water & an occasional drink with aspartame in it. He is also taking 10-12 units of Novolog Flexpen that he got from his sister when his PM readings are high. He's taking the Novolog multiple times a week.

## 2019-12-29 NOTE — Telephone Encounter (Signed)
Attempted to contact patient. Voicemail full.

## 2019-12-29 NOTE — Telephone Encounter (Signed)
Will you please instruct patient to increase his insulin he uses each morning by 2 units each day it is >130 fasting? So if tomorrow it is above 130, he should take 32 units of insulin (assuming he's still using 30u daily as he reported at recent office visit). If Thursday it is above 130, he should add an additional 2 units so that he's taking 34u. Once his fasting sugar is below 130 he can keep insulin at the dose he reached. If he has any numbers <80 he should notify us. Happy to have an office visit too to discuss further and to add an additional agent outside of insulin and Metformin if he would like.   Marcy Siren, D.O. Primary Care at [facility]  12/29/2019, 4:38 PM

## 2019-12-29 NOTE — Telephone Encounter (Signed)
Patient requesting to be on something to help with his high blood sugar, patient checked sugar 330. Patient would like something to either be added or to be changed as he is supposed to have surgery and it is a possibility they will not do it with A1C being high.  Please contact patient or patient wife in regards to his concerns.    [phone] (patient wife)

## 2019-12-29 NOTE — Telephone Encounter (Signed)
Ok thanks for the update. I will speak to him further at his visit tomorrow.   Marcy Siren, D.O. Primary Care at [facility]  12/29/2019, 5:22 PM

## 2019-12-30 ENCOUNTER — Telehealth (INDEPENDENT_AMBULATORY_CARE_PROVIDER_SITE_OTHER): Payer: BC Managed Care – PPO | Admitting: Internal Medicine

## 2019-12-30 ENCOUNTER — Encounter: Payer: Self-pay | Admitting: Internal Medicine

## 2019-12-30 DIAGNOSIS — E1165 Type 2 diabetes mellitus with hyperglycemia: Secondary | ICD-10-CM | POA: Diagnosis not present

## 2019-12-30 DIAGNOSIS — Z794 Long term (current) use of insulin: Secondary | ICD-10-CM

## 2019-12-30 MED ORDER — INSULIN ASPART 100 UNIT/ML ~~LOC~~ SOLN: 2.0000 [IU] | Freq: Three times a day (TID) | SUBCUTANEOUS | 11 refills | Status: DC

## 2019-12-30 NOTE — Progress Notes (Signed)
Virtual Visit via Telephone Note  I connected with Theodore [name], on 12/30/2019 at 3:30 PM by telephone due to the COVID-19 pandemic and verified that I am speaking with the correct person using two identifiers.   Consent: I discussed the limitations, risks, security and privacy concerns of performing an evaluation and management service by telephone and the availability of in person appointments. I also discussed with the patient that there may be a patient responsible charge related to this service. The patient expressed understanding and agreed to proceed.   Location of Patient: Home   Location of Provider: Clinic    Persons participating in Telemedicine visit: Akshaj Besancon [facility] Dr. Juleen China      History of Present Illness: Patient has a visit for concerns about hyperglycemia with T2DM. He is scheduled to have left shoulder arthroscopy.   Reports yesterday he pigged out and CBG was 330. Today, he ate "a decent breakfast", a cheeseburger, and no snacks and CBG was 228. These were about 3 hours after eating.   Fasting CBG yesterday AM was 66 or 69. Today, it was 93.    He is using 30 units of Basaglar each morning. His sister gave him an extra box of Novolog.    Past Medical History:  Diagnosis Date  . Anxiety   . Arthritis   . Bradycardia   . Depression   . Diabetes mellitus    dx 2010  . Pacemaker    Permanent pacemaker placement by Dr. Herbie Baltimore McQueen--10/21/2007--a Medtronic Adapta--serial number #[id number]  . Syncope 09/2007   2D Echo--History of frequent episodes of syncope beginning at 54 years of age--usually associated with some type of vagal issue but he does completely lose consciousness   Allergies  Allergen Reactions  . Cephalexin Hives  . Vancomycin Hives, Itching and Other (See Comments)    True Allergic Reaction -- Not "Red Man's Syndrome Onset of prominent red rash, severe itching/hives Not responsive to Benadryl and IV rate  reduction. First seen pre-op for surgery on 06/05/18.    Current Outpatient Medications on File Prior to Visit  Medication Sig Dispense Refill  . ACCU-CHEK AVIVA PLUS test strip Check blood sugars twice a day. Dx: E11.59, Z79.4 100 each 0  . citalopram (CELEXA) 20 MG tablet Take 1 tablet (20 mg total) by mouth daily. 90 tablet 4  . Insulin Glargine (BASAGLAR KWIKPEN) 100 UNIT/ML INJECT 30 UNITS TOTAL INTO THE SKIN DAILY. 15 mL 1  . Insulin Pen Needle (B-D UF III MINI PEN NEEDLES) 31G X 5 MM MISC BD Ultra-Fine Short Pen Needle 31 gauge x 5/16"  Use with Basaglar Kwikpen 200 each 1  . lisinopril (ZESTRIL) 5 MG tablet Take 1 tablet (5 mg total) by mouth daily. 90 tablet 1  . metFORMIN (GLUCOPHAGE) 1000 MG tablet Take 1 tablet (1,000 mg total) by mouth 2 (two) times daily with a meal. 180 tablet 1   No current facility-administered medications on file prior to visit.    Observations/Objective: NAD. Speaking clearly.  Work of breathing normal.  Alert and oriented. Mood appropriate.   Assessment and Plan: 1. Type 2 diabetes mellitus with hyperglycemia, with long-term current use of insulin (HCC) Discussed that diet is the likely culprit of his elevated glucose. His fasting CBGs are very well controlled. Hesitant to increase his long acting insulin or to add another oral agent that could potentially cause hypoglycemia. Will start low dose Novolog 2 units with meals. Patient to monitor CBGs fasting and 2hr postprandial.  Last A1c was 11.6, a year prior it was 7.4. Declines nutrition consult. Reports he knows what he needs to be eating but doesn't have the motivation. Encouraged upcoming surgery to be the motivation for more optimal glycemic control. If does not improve with addition of short acting insulin would recommend seeing pharmacist for consult for DM management.  - insulin aspart (NOVOLOG) 100 UNIT/ML injection; Inject 2 Units into the skin 3 (three) times daily with meals.  Dispense: 10 mL;  Refill: 11   Follow Up Instructions: As scheduled    I discussed the assessment and treatment plan with the patient. The patient was provided an opportunity to ask questions and all were answered. The patient agreed with the plan and demonstrated an understanding of the instructions.   The patient was advised to call back or seek an in-person evaluation if the symptoms worsen or if the condition fails to improve as anticipated.     I provided 18 minutes total of non-face-to-face time during this encounter including median intraservice time, reviewing previous notes, investigations, ordering medications, medical decision making, coordinating care and patient verbalized understanding at the end of the visit.    Marcy Siren, D.O. Primary Care at [facility]  12/30/2019, 3:30 PM

## 2020-01-28 ENCOUNTER — Other Ambulatory Visit: Payer: Self-pay | Admitting: Family Medicine

## 2020-02-01 ENCOUNTER — Other Ambulatory Visit: Payer: Self-pay

## 2020-02-01 MED ORDER — CITALOPRAM HYDROBROMIDE 20 MG PO TABS: 20.0000 mg | ORAL_TABLET | Freq: Every day | ORAL | 0 refills | Status: DC

## 2020-02-01 MED ORDER — ACCU-CHEK AVIVA PLUS VI STRP: ORAL_STRIP | 2 refills | Status: DC

## 2020-02-22 ENCOUNTER — Other Ambulatory Visit: Payer: Self-pay | Admitting: Internal Medicine

## 2020-02-29 ENCOUNTER — Ambulatory Visit: Payer: BC Managed Care – PPO | Admitting: Internal Medicine

## 2020-03-02 ENCOUNTER — Telehealth (INDEPENDENT_AMBULATORY_CARE_PROVIDER_SITE_OTHER): Payer: BC Managed Care – PPO | Admitting: Internal Medicine

## 2020-03-02 ENCOUNTER — Encounter: Payer: Self-pay | Admitting: Internal Medicine

## 2020-03-02 DIAGNOSIS — E1159 Type 2 diabetes mellitus with other circulatory complications: Secondary | ICD-10-CM | POA: Diagnosis not present

## 2020-03-02 DIAGNOSIS — Z794 Long term (current) use of insulin: Secondary | ICD-10-CM | POA: Diagnosis not present

## 2020-03-02 NOTE — Progress Notes (Signed)
Virtual Visit via Telephone Note  I connected with Theodore [name], on 03/02/2020 at 3:46 PM by telephone due to the COVID-19 pandemic and verified that I am speaking with the correct person using two identifiers.   Consent: I discussed the limitations, risks, security and privacy concerns of performing an evaluation and management service by telephone and the availability of in person appointments. I also discussed with the patient that there may be a patient responsible charge related to this service. The patient expressed understanding and agreed to proceed.   Location of Patient: Home   Location of Provider: Clinic    Persons participating in Telemedicine visit: Theodore [name] [facility] Dr. Earlene [name]    History of Present Illness: Patient has a visit to follow up on DM.   Diabetes mellitus, Type 2 At last visit we added Novolog 2u TID with meals. Reports he is eating a lot better since his last visit as well. Feels like glycemic control has improved.  Disease Monitoring             Blood Sugar Ranges: Fasting - Reports that have been good, typically <120.              Polyuria: no              Visual problems: no   Urine Microalbumin Will collect at lab visit   Last A1C: 11.6 (March 2021)   Medications: Novolog 2u TID, Metformin 1000 mg BID, Basaglar 30u daily  Medication Compliance: yes  Medication Side Effects             Hypoglycemia: no       Past Medical History:  Diagnosis Date  . Anxiety   . Arthritis   . Bradycardia   . Depression   . Diabetes mellitus    dx 2010  . Pacemaker    Permanent pacemaker placement by Dr. Molly Maduro McQueen--10/21/2007--a Medtronic Adapta--serial number #[id number]  . Syncope 09/2007   2D Echo--History of frequent episodes of syncope beginning at 54 years of age--usually associated with some type of vagal issue but he does completely lose consciousness   Allergies  Allergen Reactions  . Cephalexin Hives  . Vancomycin  Hives, Itching and Other (See Comments)    True Allergic Reaction -- Not "Red Man's Syndrome Onset of prominent red rash, severe itching/hives Not responsive to Benadryl and IV rate reduction. First seen pre-op for surgery on 06/05/18.    Current Outpatient Medications on File Prior to Visit  Medication Sig Dispense Refill  . Insulin Glargine (BASAGLAR KWIKPEN) 100 UNIT/ML INJECT 30 UNITS TOTAL INTO THE SKIN DAILY. 45 pen 1  . ACCU-CHEK AVIVA PLUS test strip Check blood sugars twice a day. Dx: E11.59, Z79.4 100 each 2  . citalopram (CELEXA) 20 MG tablet Take 1 tablet (20 mg total) by mouth daily. 90 tablet 0  . clobetasol cream (TEMOVATE) 0.05 %     . doxycycline (PERIOSTAT) 20 MG tablet Take 20 mg by mouth 2 (two) times daily.    Marland Kitchen gabapentin (NEURONTIN) 300 MG capsule Take 300 mg by mouth 3 (three) times daily.    . insulin aspart (NOVOLOG) 100 UNIT/ML injection Inject 2 Units into the skin 3 (three) times daily with meals. 10 mL 11  . Insulin Pen Needle (B-D UF III MINI PEN NEEDLES) 31G X 5 MM MISC BD Ultra-Fine Short Pen Needle 31 gauge x 5/16"  Use with Basaglar Kwikpen 200 each 1  . lisinopril (ZESTRIL) 5 MG tablet Take  1 tablet (5 mg total) by mouth daily. 90 tablet 1  . metFORMIN (GLUCOPHAGE) 1000 MG tablet Take 1 tablet (1,000 mg total) by mouth 2 (two) times daily with a meal. 180 tablet 1  . metroNIDAZOLE (METROGEL) 0.75 % gel Apply 1 application topically at bedtime.     No current facility-administered medications on file prior to visit.    Observations/Objective: NAD. Speaking clearly.  Work of breathing normal.  Alert and oriented. Mood appropriate.   Assessment and Plan: 1. Type 2 diabetes mellitus with other circulatory complication, with long-term current use of insulin ([place]) Patient reports he can still do better with diet, but he has been making a lot of changes. Sounds to have better glycemic control. Will repeat A1c. Continue current regimen. He is on Lisinopril  but will check microalbumin.  Counseled on Diabetic diet, my plate method, 470 minutes of moderate intensity exercise/week Blood sugar logs with fasting goals of 80-120 mg/dl, random of less than 180 and in the event of sugars less than 60 mg/dl or greater than 400 mg/dl encouraged to notify the clinic. Advised on the need for annual eye exams, annual foot exams, Pneumonia vaccine. - Hemoglobin A1c; Future - Microalbumin / creatinine urine ratio; Future   Follow Up Instructions: Lab visit 6/10; 3 month f/u for DM and annual exam    I discussed the assessment and treatment plan with the patient. The patient was provided an opportunity to ask questions and all were answered. The patient agreed with the plan and demonstrated an understanding of the instructions.   The patient was advised to call back or seek an in-person evaluation if the symptoms worsen or if the condition fails to improve as anticipated.   I provided 14 minutes total of non-face-to-face time during this encounter including median intraservice time, reviewing previous notes, investigations, ordering medications, medical decision making, coordinating care and patient verbalized understanding at the end of the visit.    Theodore [name], D.O. Primary Care at [facility]  03/02/2020, 3:46 PM

## 2020-03-03 ENCOUNTER — Other Ambulatory Visit (INDEPENDENT_AMBULATORY_CARE_PROVIDER_SITE_OTHER): Payer: BC Managed Care – PPO

## 2020-03-03 ENCOUNTER — Other Ambulatory Visit: Payer: Self-pay

## 2020-03-03 DIAGNOSIS — Z794 Long term (current) use of insulin: Secondary | ICD-10-CM

## 2020-03-03 DIAGNOSIS — E1159 Type 2 diabetes mellitus with other circulatory complications: Secondary | ICD-10-CM | POA: Diagnosis not present

## 2020-03-03 NOTE — Addendum Note (Signed)
Addended by: Heidi Dach on: 03/03/2020 01:57 PM   Modules accepted: Orders

## 2020-03-04 LAB — HEMOGLOBIN A1C
Est. average glucose Bld gHb Est-mCnc: 203 mg/dL
Hgb A1c MFr Bld: 8.7 % — ABNORMAL HIGH (ref 4.8–5.6)

## 2020-03-04 NOTE — Progress Notes (Signed)
Patient notified of results & recommendations. Expressed understanding. Made a follow up appointment on 04/12/2020 at 3:10 PM

## 2020-03-07 ENCOUNTER — Emergency Department (HOSPITAL_COMMUNITY): Payer: BC Managed Care – PPO

## 2020-03-07 ENCOUNTER — Encounter (HOSPITAL_COMMUNITY): Payer: Self-pay | Admitting: Emergency Medicine

## 2020-03-07 ENCOUNTER — Observation Stay (HOSPITAL_COMMUNITY)
Admission: EM | Admit: 2020-03-07 | Discharge: 2020-03-08 | Disposition: A | Discharge status: Home / Self Care | Payer: BC Managed Care – PPO | Attending: Internal Medicine | Admitting: Internal Medicine

## 2020-03-07 ENCOUNTER — Other Ambulatory Visit: Payer: Self-pay

## 2020-03-07 DIAGNOSIS — F329 Major depressive disorder, single episode, unspecified: Secondary | ICD-10-CM | POA: Diagnosis not present

## 2020-03-07 DIAGNOSIS — I700 Atherosclerosis of aorta: Secondary | ICD-10-CM | POA: Diagnosis not present

## 2020-03-07 DIAGNOSIS — I10 Essential (primary) hypertension: Secondary | ICD-10-CM | POA: Diagnosis present

## 2020-03-07 DIAGNOSIS — E876 Hypokalemia: Secondary | ICD-10-CM

## 2020-03-07 DIAGNOSIS — F419 Anxiety disorder, unspecified: Secondary | ICD-10-CM | POA: Insufficient documentation

## 2020-03-07 DIAGNOSIS — Y92410 Unspecified street and highway as the place of occurrence of the external cause: Secondary | ICD-10-CM | POA: Insufficient documentation

## 2020-03-07 DIAGNOSIS — T383X5A Adverse effect of insulin and oral hypoglycemic [antidiabetic] drugs, initial encounter: Secondary | ICD-10-CM

## 2020-03-07 DIAGNOSIS — I2510 Atherosclerotic heart disease of native coronary artery without angina pectoris: Secondary | ICD-10-CM | POA: Diagnosis not present

## 2020-03-07 DIAGNOSIS — Z87891 Personal history of nicotine dependence: Secondary | ICD-10-CM | POA: Insufficient documentation

## 2020-03-07 DIAGNOSIS — Z79899 Other long term (current) drug therapy: Secondary | ICD-10-CM | POA: Diagnosis not present

## 2020-03-07 DIAGNOSIS — Z20822 Contact with and (suspected) exposure to covid-19: Secondary | ICD-10-CM | POA: Diagnosis not present

## 2020-03-07 DIAGNOSIS — E11649 Type 2 diabetes mellitus with hypoglycemia without coma: Secondary | ICD-10-CM | POA: Diagnosis not present

## 2020-03-07 DIAGNOSIS — M47812 Spondylosis without myelopathy or radiculopathy, cervical region: Secondary | ICD-10-CM | POA: Insufficient documentation

## 2020-03-07 DIAGNOSIS — Z950 Presence of cardiac pacemaker: Secondary | ICD-10-CM | POA: Insufficient documentation

## 2020-03-07 DIAGNOSIS — F418 Other specified anxiety disorders: Secondary | ICD-10-CM | POA: Diagnosis present

## 2020-03-07 DIAGNOSIS — E162 Hypoglycemia, unspecified: Secondary | ICD-10-CM

## 2020-03-07 DIAGNOSIS — E119 Type 2 diabetes mellitus without complications: Secondary | ICD-10-CM

## 2020-03-07 DIAGNOSIS — Z794 Long term (current) use of insulin: Secondary | ICD-10-CM | POA: Diagnosis not present

## 2020-03-07 DIAGNOSIS — I480 Paroxysmal atrial fibrillation: Secondary | ICD-10-CM | POA: Diagnosis not present

## 2020-03-07 DIAGNOSIS — E160 Drug-induced hypoglycemia without coma: Secondary | ICD-10-CM

## 2020-03-07 LAB — CBC WITH DIFFERENTIAL/PLATELET
Abs Immature Granulocytes: 0.02 10*3/uL (ref 0.00–0.07)
Basophils Absolute: 0.1 10*3/uL (ref 0.0–0.1)
Basophils Relative: 1 %
Eosinophils Absolute: 0.3 10*3/uL (ref 0.0–0.5)
Eosinophils Relative: 5 %
HCT: 45.2 % (ref 39.0–52.0)
Hemoglobin: 15.4 g/dL (ref 13.0–17.0)
Immature Granulocytes: 0 %
Lymphocytes Relative: 34 %
Lymphs Abs: 2.3 10*3/uL (ref 0.7–4.0)
MCH: 32.6 pg (ref 26.0–34.0)
MCHC: 34.1 g/dL (ref 30.0–36.0)
MCV: 95.6 fL (ref 80.0–100.0)
Monocytes Absolute: 0.9 10*3/uL (ref 0.1–1.0)
Monocytes Relative: 13 %
Neutro Abs: 3.2 10*3/uL (ref 1.7–7.7)
Neutrophils Relative %: 47 %
Platelets: 157 10*3/uL (ref 150–400)
RBC: 4.73 MIL/uL (ref 4.22–5.81)
RDW: 11.7 % (ref 11.5–15.5)
WBC: 6.7 10*3/uL (ref 4.0–10.5)
nRBC: 0 % (ref 0.0–0.2)

## 2020-03-07 LAB — COMPREHENSIVE METABOLIC PANEL
ALT: 14 U/L (ref 0–44)
AST: 21 U/L (ref 15–41)
Albumin: 3.6 g/dL (ref 3.5–5.0)
Alkaline Phosphatase: 49 U/L (ref 38–126)
Anion gap: 12 (ref 5–15)
BUN: 16 mg/dL (ref 6–20)
CO2: 23 mmol/L (ref 22–32)
Calcium: 8.7 mg/dL — ABNORMAL LOW (ref 8.9–10.3)
Chloride: 107 mmol/L (ref 98–111)
Creatinine, Ser: 1.1 mg/dL (ref 0.61–1.24)
GFR calc Af Amer: >60 mL/min (ref >60)
GFR calc non Af Amer: >60 mL/min (ref >60)
Glucose, Bld: 132 mg/dL — ABNORMAL HIGH (ref 70–99)
Potassium: 2.8 mmol/L — ABNORMAL LOW (ref 3.5–5.1)
Sodium: 142 mmol/L (ref 135–145)
Total Bilirubin: 0.4 mg/dL (ref 0.3–1.2)
Total Protein: 6.1 g/dL — ABNORMAL LOW (ref 6.5–8.1)

## 2020-03-07 LAB — CBG MONITORING, ED
Glucose-Capillary: 42 mg/dL — CL (ref 70–99)
Glucose-Capillary: 110 mg/dL — ABNORMAL HIGH (ref 70–99)
Glucose-Capillary: 59 mg/dL — ABNORMAL LOW (ref 70–99)
Glucose-Capillary: 159 mg/dL — ABNORMAL HIGH (ref 70–99)
Glucose-Capillary: 365 mg/dL — ABNORMAL HIGH (ref 70–99)
Glucose-Capillary: 315 mg/dL — ABNORMAL HIGH (ref 70–99)

## 2020-03-07 LAB — GLUCOSE, CAPILLARY: Glucose-Capillary: 228 mg/dL — ABNORMAL HIGH (ref 70–99)

## 2020-03-07 LAB — SARS CORONAVIRUS 2 BY RT PCR (HOSPITAL ORDER, PERFORMED IN ~~LOC~~ HOSPITAL LAB): SARS Coronavirus 2: NEGATIVE

## 2020-03-07 MED ORDER — INSULIN ASPART 100 UNIT/ML ~~LOC~~ SOLN
0.0000 [IU] | Freq: Three times a day (TID) | SUBCUTANEOUS | Status: DC
  Administered 2020-03-07: 7 [IU] via SUBCUTANEOUS
  Administered 2020-03-08: 3 [IU] via SUBCUTANEOUS

## 2020-03-07 MED ORDER — DEXTROSE 50 % IV SOLN
50.0000 mL | Freq: Once | INTRAVENOUS | Status: AC
  Administered 2020-03-07: 50 mL via INTRAVENOUS
  Filled 2020-03-07: qty 50

## 2020-03-07 MED ORDER — ACETAMINOPHEN 650 MG RE SUPP: 650.0000 mg | Freq: Four times a day (QID) | RECTAL | Status: DC | PRN

## 2020-03-07 MED ORDER — GABAPENTIN 300 MG PO CAPS
300.0000 mg | ORAL_CAPSULE | Freq: Three times a day (TID) | ORAL | Status: DC
  Administered 2020-03-07 – 2020-03-08 (×3): 300 mg via ORAL
  Filled 2020-03-07 (×2): qty 1
  Filled 2020-03-07: qty 3

## 2020-03-07 MED ORDER — ACETAMINOPHEN 325 MG PO TABS: 650.0000 mg | ORAL_TABLET | Freq: Four times a day (QID) | ORAL | Status: DC | PRN

## 2020-03-07 MED ORDER — IOHEXOL 300 MG/ML  SOLN
100.0000 mL | Freq: Once | INTRAMUSCULAR | Status: AC | PRN
  Administered 2020-03-07: 100 mL via INTRAVENOUS

## 2020-03-07 MED ORDER — LISINOPRIL 5 MG PO TABS
5.0000 mg | ORAL_TABLET | Freq: Every day | ORAL | Status: DC
  Administered 2020-03-08: 5 mg via ORAL
  Filled 2020-03-07: qty 1

## 2020-03-07 MED ORDER — DEXTROSE 50 % IV SOLN
1.0000 | Freq: Once | INTRAVENOUS | Status: AC
  Administered 2020-03-07: 50 mL via INTRAVENOUS
  Filled 2020-03-07: qty 50

## 2020-03-07 MED ORDER — METHOCARBAMOL 1000 MG/10ML IJ SOLN
500.0000 mg | Freq: Four times a day (QID) | INTRAVENOUS | Status: DC | PRN
  Filled 2020-03-07: qty 5

## 2020-03-07 MED ORDER — ONDANSETRON HCL 4 MG PO TABS: 4.0000 mg | ORAL_TABLET | Freq: Four times a day (QID) | ORAL | Status: DC | PRN

## 2020-03-07 MED ORDER — CITALOPRAM HYDROBROMIDE 20 MG PO TABS
20.0000 mg | ORAL_TABLET | Freq: Every day | ORAL | Status: DC
  Administered 2020-03-08: 20 mg via ORAL
  Filled 2020-03-07: qty 1

## 2020-03-07 MED ORDER — POTASSIUM CHLORIDE CRYS ER 20 MEQ PO TBCR
40.0000 meq | EXTENDED_RELEASE_TABLET | Freq: Once | ORAL | Status: AC
  Administered 2020-03-07: 40 meq via ORAL
  Filled 2020-03-07: qty 2

## 2020-03-07 MED ORDER — ENOXAPARIN SODIUM 40 MG/0.4ML ~~LOC~~ SOLN
40.0000 mg | SUBCUTANEOUS | Status: DC
  Administered 2020-03-07: 40 mg via SUBCUTANEOUS
  Filled 2020-03-07: qty 0.4

## 2020-03-07 MED ORDER — DEXTROSE 10 % IV SOLN: INTRAVENOUS | Status: DC

## 2020-03-07 MED ORDER — HYDROCODONE-ACETAMINOPHEN 5-325 MG PO TABS: 1.0000 | ORAL_TABLET | ORAL | Status: DC | PRN

## 2020-03-07 MED ORDER — ONDANSETRON HCL 4 MG/2ML IJ SOLN: 4.0000 mg | Freq: Four times a day (QID) | INTRAMUSCULAR | Status: DC | PRN
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
## 2020-03-07 NOTE — ED Notes (Signed)
Checked patient blood sugar it was 59 notified RN kelly of blood sugar

## 2020-03-07 NOTE — ED Notes (Signed)
C collar removed , PA request. Sitting up on stretcher , drinking diet coke.
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
## 2020-03-08 DIAGNOSIS — T383X5A Adverse effect of insulin and oral hypoglycemic [antidiabetic] drugs, initial encounter: Secondary | ICD-10-CM | POA: Diagnosis not present

## 2020-03-08 DIAGNOSIS — E160 Drug-induced hypoglycemia without coma: Secondary | ICD-10-CM | POA: Diagnosis not present

## 2020-03-08 LAB — CBC
HCT: 42.3 % (ref 39.0–52.0)
Hemoglobin: 14.5 g/dL (ref 13.0–17.0)
MCH: 32.7 pg (ref 26.0–34.0)
MCHC: 34.3 g/dL (ref 30.0–36.0)
MCV: 95.3 fL (ref 80.0–100.0)
Platelets: 130 10*3/uL — ABNORMAL LOW (ref 150–400)
RBC: 4.44 MIL/uL (ref 4.22–5.81)
RDW: 11.8 % (ref 11.5–15.5)
WBC: 7.1 10*3/uL (ref 4.0–10.5)
nRBC: 0 % (ref 0.0–0.2)

## 2020-03-08 LAB — BASIC METABOLIC PANEL
Anion gap: 9 (ref 5–15)
BUN: 13 mg/dL (ref 6–20)
CO2: 26 mmol/L (ref 22–32)
Calcium: 8.5 mg/dL — ABNORMAL LOW (ref 8.9–10.3)
Chloride: 102 mmol/L (ref 98–111)
Creatinine, Ser: 0.94 mg/dL (ref 0.61–1.24)
GFR calc Af Amer: >60 mL/min (ref >60)
GFR calc non Af Amer: >60 mL/min (ref >60)
Glucose, Bld: 229 mg/dL — ABNORMAL HIGH (ref 70–99)
Potassium: 4.1 mmol/L (ref 3.5–5.1)
Sodium: 137 mmol/L (ref 135–145)

## 2020-03-08 MED ORDER — INSULIN ASPART 100 UNIT/ML ~~LOC~~ SOLN
2.0000 [IU] | Freq: Three times a day (TID) | SUBCUTANEOUS | Status: DC
  Administered 2020-03-08: 2 [IU] via SUBCUTANEOUS

## 2020-03-08 MED ORDER — INSULIN GLARGINE 100 UNIT/ML ~~LOC~~ SOLN
30.0000 [IU] | Freq: Every day | SUBCUTANEOUS | Status: DC
  Administered 2020-03-08: 30 [IU] via SUBCUTANEOUS
  Filled 2020-03-08: qty 0.3

## 2020-03-08 MED ORDER — BASAGLAR KWIKPEN 100 UNIT/ML ~~LOC~~ SOPN: 30.0000 [IU] | PEN_INJECTOR | Freq: Every day | SUBCUTANEOUS | Status: DC
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
## 2020-03-08 NOTE — Progress Notes (Signed)
Patient upset that wife has to leave d/t visiting policy. Patient stating that he is going to leave since all that is being done is checking his CBG, and he can do that at home. This RN explained to patient that MD wants overnight observation of CBG, VS, and tele. Nelda Marseille, Charge RN also spoke to patient and wife. This RN informed patient that he can sign himself out AMA, but insurance will not pay for his stay if he does so. Patient very upset about hospital policy, insurance, Covid, etc. Patient cursing and yelling, threw phone. Wife in room attempting to calm patient and waiting for her transportation. Opyd, MD notified via text page and call. Patient agrees to stay until morning. Will continue to monitor.

## 2020-03-08 NOTE — Progress Notes (Signed)
Inpatient Diabetes Program Recommendations  AACE/ADA: New Consensus Statement on Inpatient Glycemic Control (2015)  Target Ranges:  Prepandial:   less than 140 mg/dL      Peak postprandial:   less than 180 mg/dL (1-2 hours)      Critically ill patients:  140 - 180 mg/dL   Lab Results  Component Value Date   GLUCAP 228 (H) 03/07/2020   HGBA1C 8.7 (H) 03/03/2020    Review of Glycemic Control  Diabetes history: DM 2 Outpatient Diabetes medications: Basaglar 30 units, Nov 2 units tid mc   A1c 8.7% on 6/10, 11.6 on 3/4   Inpatient Diabetes Program Recommendations:    Pt reports changing what he eats drastically at home. PT did not eat when he checked his glucose yesterday am and glucose was in the 70's. Spoke with pt about importance of eating a snack if his glucose is in the 70's until he reaches work where he eats breakfast.  Also discussed with pt about the possibility of not needing the same amount of basal insulin due to him cutting back what he eats at home. Pt reports occasional hypoglycemia, also at night time.  Pt still has more improvement to do with his diet at home but is trending in the right direction.  Discussed healthy snack options.  Discussed having glucose tablets in the glove compartment in his car as a back up for hypoglycemia. Pt was trying to get to a fast food restaurant to get something to eat because he could feel his low blood sugar. Does not even know why he was in that neighborhood as he usually does not travel that way.  Thanks,  Christena Deem RN, MSN, BC-ADM Inpatient Diabetes Coordinator Team Pager [phone] (8a-5p)

## 2020-03-08 NOTE — Progress Notes (Addendum)
Patient was discharged home by MD order; discharged instructions review and give to patient with care notes; IV DIC; patient refused to be helped to the car by staff via wheelchair. His wife was with him and both walked off the floor.

## 2020-03-08 NOTE — Discharge Instructions (Signed)
Hypoglycemia Hypoglycemia is when the sugar (glucose) level in your blood is too low. Signs of low blood sugar may include:  Feeling: ? Hungry. ? Worried or nervous (anxious). ? Sweaty and clammy. ? Confused. ? Dizzy. ? Sleepy. ? Sick to your stomach (nauseous).  Having: ? A fast heartbeat. ? A headache. ? A change in your vision. ? Tingling or no feeling (numbness) around your mouth, lips, or tongue. ? Jerky movements that you cannot control (seizure).  Having trouble with: ? Moving (coordination). ? Sleeping. ? Passing out (fainting). ? Getting upset easily (irritability). Low blood sugar can happen to people who have diabetes and people who do not have diabetes. Low blood sugar can happen quickly, and it can be an emergency. Treating low blood sugar Low blood sugar is often treated by eating or drinking something sugary right away, such as:  Fruit juice, 4-6 oz (120-150 mL).  Regular soda (not diet soda), 4-6 oz (120-150 mL).  Low-fat milk, 4 oz (120 mL).  Several pieces of hard candy.  Sugar or honey, 1 Tbsp (15 mL). Treating low blood sugar if you have diabetes If you can think clearly and swallow safely, follow the 15:15 rule:  Take 15 grams of a fast-acting carb (carbohydrate). Talk with your doctor about how much you should take.  Always keep a source of fast-acting carb with you, such as: ? Sugar tablets (glucose pills). Take 3-4 pills. ? 6-8 pieces of hard candy. ? 4-6 oz (120-150 mL) of fruit juice. ? 4-6 oz (120-150 mL) of regular (not diet) soda. ? 1 Tbsp (15 mL) honey or sugar.  Check your blood sugar 15 minutes after you take the carb.  If your blood sugar is still at or below 70 mg/dL (3.9 mmol/L), take 15 grams of a carb again.  If your blood sugar does not go above 70 mg/dL (3.9 mmol/L) after 3 tries, get help right away.  After your blood sugar goes back to normal, eat a meal or a snack within 1 hour.  Treating very low blood sugar If your  blood sugar is at or below 54 mg/dL (3 mmol/L), you have very low blood sugar (severe hypoglycemia). This may also cause:  Passing out.  Jerky movements you cannot control (seizure).  Losing consciousness (coma). This is an emergency. Do not wait to see if the symptoms will go away. Get medical help right away. Call your local emergency services (911 in the [place] Do not drive yourself to the hospital. If you have very low blood sugar and you cannot eat or drink, you may need a glucagon shot (injection). A family member or friend should learn how to check your blood sugar and how to give you a glucagon shot. Ask your doctor if you need to have a glucagon shot kit at home. Follow these instructions at home: General instructions  Take over-the-counter and prescription medicines only as told by your doctor.  Stay aware of your blood sugar as told by your doctor.  Limit alcohol intake to no more than 1 drink a day for nonpregnant women and 2 drinks a day for men. One drink equals 12 oz of beer (355 mL), 5 oz of wine (148 mL), or 1 oz of hard liquor (44 mL).  Keep all follow-up visits as told by your doctor. This is important. If you have diabetes:   Follow your diabetes care plan as told by your doctor. Make sure you: ? Know the signs of low blood sugar. ?  Take your medicines as told. ? Follow your exercise and meal plan. ? Eat on time. Do not skip meals. ? Check your blood sugar as often as told by your doctor. Always check it before and after exercise. ? Follow your sick day plan when you cannot eat or drink normally. Make this plan ahead of time with your doctor.  Share your diabetes care plan with: ? Your work or school. ? People you live with.  Check your pee (urine) for ketones: ? When you are sick. ? As told by your doctor.  Carry a card or wear jewelry that says you have diabetes. Contact a doctor if:  You have trouble keeping your blood sugar in your target  range.  You have low blood sugar often. Get help right away if:  You still have symptoms after you eat or drink something sugary.  Your blood sugar is at or below 54 mg/dL (3 mmol/L).  You have jerky movements that you cannot control.  You pass out. These symptoms may be an emergency. Do not wait to see if the symptoms will go away. Get medical help right away. Call your local emergency services (911 in the [place] Do not drive yourself to the hospital. Summary  Hypoglycemia happens when the level of sugar (glucose) in your blood is too low.  Low blood sugar can happen to people who have diabetes and people who do not have diabetes. Low blood sugar can happen quickly, and it can be an emergency.  Make sure you know the signs of low blood sugar and know how to treat it.  Always keep a source of sugar (fast-acting carb) with you to treat low blood sugar. This information is not intended to replace advice given to you by your health care provider. Make sure you discuss any questions you have with your health care provider. Document Revised: 01/01/2019 Document Reviewed: 10/14/2015 Elsevier Patient Education  2020 Elsevier Inc.  

## 2020-03-10 LAB — GLUCOSE, CAPILLARY: Glucose-Capillary: 225 mg/dL — ABNORMAL HIGH (ref 70–99)

## 2020-03-11 ENCOUNTER — Ambulatory Visit (INDEPENDENT_AMBULATORY_CARE_PROVIDER_SITE_OTHER): Payer: BC Managed Care – PPO | Admitting: *Deleted

## 2020-03-11 DIAGNOSIS — R001 Bradycardia, unspecified: Secondary | ICD-10-CM | POA: Diagnosis not present

## 2020-03-12 LAB — CUP PACEART REMOTE DEVICE CHECK
Battery Impedance: 1600 Ohm
Battery Remaining Longevity: 48 mo
Battery Voltage: 2.76 V
Brady Statistic AP VP Percent: 0 %
Brady Statistic AP VS Percent: 6 %
Brady Statistic AS VP Percent: 0 %
Brady Statistic AS VS Percent: 94 %
Date Time Interrogation Session: 20210614100649
Implantable Lead Implant Date: 20090127
Implantable Lead Implant Date: 20090127
Implantable Lead Location: 753859
Implantable Lead Location: 753860
Implantable Lead Model: 5076
Implantable Lead Model: 5076
Implantable Pulse Generator Implant Date: 20090127
Lead Channel Impedance Value: 454 Ohm
Lead Channel Impedance Value: 503 Ohm
Lead Channel Pacing Threshold Amplitude: 0.5 V
Lead Channel Pacing Threshold Amplitude: 0.75 V
Lead Channel Pacing Threshold Pulse Width: 0.4 ms
Lead Channel Pacing Threshold Pulse Width: 0.4 ms
Lead Channel Setting Pacing Amplitude: 2 V
Lead Channel Setting Pacing Amplitude: 2.5 V
Lead Channel Setting Pacing Pulse Width: 0.4 ms
Lead Channel Setting Sensing Sensitivity: 5.6 mV

## 2020-03-14 NOTE — Progress Notes (Signed)
Remote pacemaker transmission.   

## 2020-03-22 ENCOUNTER — Encounter (INDEPENDENT_AMBULATORY_CARE_PROVIDER_SITE_OTHER): Payer: BC Managed Care – PPO | Admitting: Ophthalmology

## 2020-03-23 ENCOUNTER — Encounter (INDEPENDENT_AMBULATORY_CARE_PROVIDER_SITE_OTHER): Payer: BC Managed Care – PPO | Admitting: Ophthalmology

## 2020-03-30 ENCOUNTER — Other Ambulatory Visit: Payer: Self-pay | Admitting: Orthopedic Surgery

## 2020-03-30 DIAGNOSIS — M5416 Radiculopathy, lumbar region: Secondary | ICD-10-CM

## 2020-03-31 ENCOUNTER — Telehealth: Payer: Self-pay

## 2020-03-31 NOTE — Telephone Encounter (Signed)
Spoke with patient to review his medications and drug allergies prior to him being scheduled for a lumbar myelogram.  He states an understanding that he will be here two hours, will need a driver and he will need to be on strict bedrest (explained) for 24 hours after the myelogram.  He also states an understanding to hold Celexa/Citalopram for 48 hours before, and 24 hours after, the myelogram.

## 2020-04-08 ENCOUNTER — Ambulatory Visit
Admission: RE | Admit: 2020-04-08 | Discharge: 2020-04-08 | Disposition: A | Discharge status: Home / Self Care | Payer: BC Managed Care – PPO | Source: Ambulatory Visit | Attending: Orthopedic Surgery | Admitting: Orthopedic Surgery

## 2020-04-08 DIAGNOSIS — M5416 Radiculopathy, lumbar region: Secondary | ICD-10-CM

## 2020-04-08 MED ORDER — DIAZEPAM 5 MG PO TABS
10.0000 mg | ORAL_TABLET | Freq: Once | ORAL | Status: AC
  Administered 2020-04-08: 10 mg via ORAL

## 2020-04-08 MED ORDER — IOPAMIDOL (ISOVUE-M 200) INJECTION 41%
18.0000 mL | Freq: Once | INTRAMUSCULAR | Status: AC
  Administered 2020-04-08: 18 mL via INTRATHECAL

## 2020-04-08 NOTE — Discharge Instructions (Signed)

## 2020-04-11 ENCOUNTER — Telehealth: Payer: Self-pay

## 2020-04-11 NOTE — Telephone Encounter (Signed)

## 2020-04-12 ENCOUNTER — Encounter: Payer: Self-pay | Admitting: Internal Medicine

## 2020-04-12 ENCOUNTER — Other Ambulatory Visit: Payer: Self-pay

## 2020-04-12 ENCOUNTER — Ambulatory Visit (INDEPENDENT_AMBULATORY_CARE_PROVIDER_SITE_OTHER): Payer: BC Managed Care – PPO | Admitting: Internal Medicine

## 2020-04-12 VITALS — BP 128/89 | HR 87 | Temp 97.3°F | Resp 17 | Wt 166.0 lb

## 2020-04-12 DIAGNOSIS — E1159 Type 2 diabetes mellitus with other circulatory complications: Secondary | ICD-10-CM

## 2020-04-12 DIAGNOSIS — F430 Acute stress reaction: Secondary | ICD-10-CM

## 2020-04-12 DIAGNOSIS — Z794 Long term (current) use of insulin: Secondary | ICD-10-CM | POA: Diagnosis not present
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
## 2020-04-26 ENCOUNTER — Other Ambulatory Visit: Payer: Self-pay

## 2020-04-26 MED ORDER — CITALOPRAM HYDROBROMIDE 20 MG PO TABS: 20.0000 mg | ORAL_TABLET | Freq: Every day | ORAL | 0 refills | Status: DC

## 2020-05-19 ENCOUNTER — Ambulatory Visit: Payer: BC Managed Care – PPO | Admitting: Internal Medicine

## 2020-06-02 ENCOUNTER — Ambulatory Visit
Admission: EM | Admit: 2020-06-02 | Discharge: 2020-06-02 | Disposition: A | Discharge status: Home / Self Care | Payer: BC Managed Care – PPO | Attending: Physician Assistant | Admitting: Physician Assistant

## 2020-06-02 DIAGNOSIS — L03313 Cellulitis of chest wall: Secondary | ICD-10-CM

## 2020-06-02 MED ORDER — TRIAMCINOLONE ACETONIDE 0.1 % EX CREA: 1.0000 "application " | TOPICAL_CREAM | Freq: Two times a day (BID) | CUTANEOUS | 0 refills | Status: DC

## 2020-06-02 MED ORDER — DOXYCYCLINE HYCLATE 100 MG PO CAPS
100.0000 mg | ORAL_CAPSULE | Freq: Two times a day (BID) | ORAL | 0 refills | Status: DC
Start: 2020-06-02 — End: 2020-06-09

## 2020-06-02 NOTE — Discharge Instructions (Signed)
Start doxycycline as directed. Monitor for spreading redness, warmth, fever, follow up for reevaluation.  Otherwise, you can use triamcinolone for itching when needed.

## 2020-06-02 NOTE — ED Triage Notes (Signed)
Pt c/o insect bite to rt upper side/chest x2 day with redness and pain.

## 2020-06-02 NOTE — ED Provider Notes (Signed)
[facility]    CSN: [id number] Arrival date & time: 06/02/20  1221      History   Chief Complaint Chief Complaint  Patient presents with  . Insect Bite    HPI Theodore [name] is a 54 y.o. male.   54 year old male with history of uncontrolled DM comes in for 2 day history of redness and pain to rash on right chest. States this first started with itching and was thought to be an insect bite. Now having pain, spreading redness. Denies fever.   DM uncontrolled with last a1c 8.7 but improved from 11.6     Past Medical History:  Diagnosis Date  . Anxiety   . Arthritis   . Depression   . Diabetes mellitus    dx 2010  . Pacemaker    Permanent pacemaker placement by Dr. Molly Maduro McQueen--10/21/2007--a [employer]onic Adapta--serial number #[id number]  . Syncope 09/2007   2D Echo--History of frequent episodes of syncope beginning at 54 years of age--usually associated with some type of vagal issue but he does completely lose consciousness    Patient Active Problem List   Diagnosis Date Noted  . Hypoglycemia due to insulin 03/07/2020  . Atherosclerosis of coronary artery of native heart 03/07/2020  . Essential hypertension 11/26/2019  . Neurogenic orthostatic hypotension (HCC) 06/06/2018  . Status post lumbar spine surgery for decompression of spinal cord 06/05/2018  . Paroxysmal atrial fibrillation (HCC) 12/12/2017  . Diabetes mellitus type 2 in nonobese (HCC) 12/12/2017  . Neurocardiogenic syncope 07/23/2014  . Pacemaker 07/23/2014  . Tachycardia 07/23/2014  . Type 2 diabetes mellitus with hyperglycemia (HCC) 10/20/2011  . Sinoatrial bradycardia 10/20/2011  . Depression with anxiety 10/20/2011    Past Surgical History:  Procedure Laterality Date  . CARDIAC CATHETERIZATION Right 10/20/2007  . HAND SURGERY    . LUMBAR LAMINECTOMY/DECOMPRESSION MICRODISCECTOMY N/A 06/05/2018   Procedure: Lumbar decompression L4-S1;  Surgeon: [name], MD;  Location: [facility] OR;   Service: Orthopedics;  Laterality: N/A;  3 hrs  . PACEMAKER INSERTION  10/21/2007   Dr Jenne Campus  . SHOULDER SURGERY  1999  . TONSILLECTOMY         Home Medications    Prior to Admission medications   Medication Sig Start Date End Date Taking? Authorizing Provider  ACCU-CHEK AVIVA PLUS test strip Check blood sugars twice a day. Dx: E11.59, Z79.4 02/01/20   Arvilla Market, DO  citalopram (CELEXA) 20 MG tablet Take 1 tablet (20 mg total) by mouth daily. 04/26/20   Arvilla Market, DO  clobetasol cream (TEMOVATE) 0.05 % Apply 1 application topically 2 (two) times daily.  01/19/20   [provider]  doxycycline (VIBRAMYCIN) 100 MG capsule Take 1 capsule (100 mg total) by mouth 2 (two) times daily. 06/02/20   Cathie Hoops, Dorleen Kissel V, PA-C  gabapentin (NEURONTIN) 300 MG capsule Take 300 mg by mouth 3 (three) times daily. 02/29/20   [provider]  insulin aspart (NOVOLOG) 100 UNIT/ML injection Inject 2 Units into the skin 3 (three) times daily with meals. 12/30/19   Arvilla Market, DO  Insulin Glargine (BASAGLAR KWIKPEN) 100 UNIT/ML INJECT 30 UNITS TOTAL INTO THE SKIN DAILY. Patient taking differently: Inject 30 Units into the skin daily.  02/23/20   Arvilla Market, DO  Insulin Pen Needle (B-D UF III MINI PEN NEEDLES) 31G X 5 MM MISC BD Ultra-Fine Short Pen Needle 31 gauge x 5/16"  Use with Basaglar Stephanie Coup 11/30/19   Arvilla Market, DO  lisinopril (  ZESTRIL) 5 MG tablet Take 1 tablet (5 mg total) by mouth daily. 11/30/19   Arvilla Market, DO  metFORMIN (GLUCOPHAGE) 1000 MG tablet Take 1 tablet (1,000 mg total) by mouth 2 (two) times daily with a meal. 11/30/19   Arvilla Market, DO  metroNIDAZOLE (METROGEL) 0.75 % gel Apply 1 application topically at bedtime. 01/19/20   [provider]  triamcinolone cream (KENALOG) 0.1 % Apply 1 application topically 2 (two) times daily. 06/02/20   Belinda Fisher, PA-C    Family History Family  History  Problem Relation Age of Onset  . Diabetes Sister   . Diabetes Mother   . Coronary artery disease Mother 58's  . Hypertension Mother     Social History Social History   Tobacco Use  . Smoking status: Former Smoker    Packs/day: 1.00    Years: 30.00    Pack years: 30.00    Quit date: 10/17/2007    Years since quitting: 12.6  . Smokeless tobacco: Former Clinical biochemist  . Vaping Use: Never used  Substance Use Topics  . Alcohol use: No  . Drug use: No     Allergies   Cephalexin and Vancomycin   Review of Systems Review of Systems  Reason unable to perform ROS: See HPI as above.     Physical Exam Triage Vital Signs ED Triage Vitals [06/02/20 1446]  Enc Vitals Group     BP (!) 143/84     Pulse Rate 92     Resp 18     Temp 98.2 F (36.8 C)     Temp Source Oral     SpO2 98 %     Weight      Height      Head Circumference      Peak Flow      Pain Score 0     Pain Loc      Pain Edu?      Excl. in GC?    No data found.  Updated Vital Signs BP (!) 143/84 (BP Location: Right Arm)   Pulse 92   Temp 98.2 F (36.8 C) (Oral)   Resp 18   SpO2 98%   Physical Exam Constitutional:      General: He is not in acute distress.    Appearance: Normal appearance. He is well-developed. He is not toxic-appearing or diaphoretic.  HENT:     Head: Normocephalic and atraumatic.  Eyes:     Conjunctiva/sclera: Conjunctivae normal.     Pupils: Pupils are equal, round, and reactive to light.  Pulmonary:     Effort: Pulmonary effort is normal. No respiratory distress.  Chest:     Comments: Small scarring/wound that could be possible insect bite. approx 10cm x 4cm erythema with warmth. Tenderness to palpation. No induration, fluctuance. Musculoskeletal:     Cervical back: Normal range of motion and neck supple.  Skin:    General: Skin is warm and dry.  Neurological:     Mental Status: He is alert and oriented to person, place, and time.      UC Treatments /  Results  Labs (all labs ordered are listed, but only abnormal results are displayed) Labs Reviewed - No data to display  EKG   Radiology No results found.  Procedures Procedures (including critical care time)  Medications Ordered in UC Medications - No data to display  Initial Impression / Assessment and Plan / UC Course  I have reviewed the triage vital  signs and the nursing notes.  Pertinent labs & imaging results that were available during my care of the patient were reviewed by me and considered in my medical decision making (see chart for details).    Start doxycycline as directed. Discussed to use triamcinolone in the future for itching. Return precautions given.  Final Clinical Impressions(s) / UC Diagnoses   Final diagnoses:  Cellulitis of chest wall   ED Prescriptions    Medication Sig Dispense Auth. Provider   doxycycline (VIBRAMYCIN) 100 MG capsule Take 1 capsule (100 mg total) by mouth 2 (two) times daily. 14 capsule Jakia Kennebrew V, PA-C   triamcinolone cream (KENALOG) 0.1 % Apply 1 application topically 2 (two) times daily. 30 g Belinda Fisher, PA-C     PDMP not reviewed this encounter.   Belinda Fisher, PA-C 06/02/20 1505

## 2020-06-08 NOTE — Patient Instructions (Signed)

## 2020-06-09 ENCOUNTER — Encounter: Payer: BC Managed Care – PPO | Admitting: Internal Medicine

## 2020-06-09 ENCOUNTER — Encounter: Payer: Self-pay | Admitting: Internal Medicine

## 2020-06-09 ENCOUNTER — Other Ambulatory Visit: Payer: Self-pay

## 2020-06-09 ENCOUNTER — Ambulatory Visit (INDEPENDENT_AMBULATORY_CARE_PROVIDER_SITE_OTHER): Payer: BC Managed Care – PPO | Admitting: Internal Medicine

## 2020-06-09 VITALS — BP 133/90 | HR 79 | Temp 97.3°F | Resp 17 | Ht 68.0 in | Wt 163.0 lb

## 2020-06-09 DIAGNOSIS — I10 Essential (primary) hypertension: Secondary | ICD-10-CM

## 2020-06-09 DIAGNOSIS — Z0000 Encounter for general adult medical examination without abnormal findings: Secondary | ICD-10-CM | POA: Diagnosis not present

## 2020-06-09 DIAGNOSIS — Z1159 Encounter for screening for other viral diseases: Secondary | ICD-10-CM | POA: Diagnosis not present

## 2020-06-09 DIAGNOSIS — Z794 Long term (current) use of insulin: Secondary | ICD-10-CM

## 2020-06-09 DIAGNOSIS — E1159 Type 2 diabetes mellitus with other circulatory complications: Secondary | ICD-10-CM | POA: Diagnosis not present

## 2020-06-09 DIAGNOSIS — Z23 Encounter for immunization: Secondary | ICD-10-CM | POA: Diagnosis not present

## 2020-06-09 DIAGNOSIS — Z7189 Other specified counseling: Secondary | ICD-10-CM

## 2020-06-09 MED ORDER — LISINOPRIL 5 MG PO TABS: 5.0000 mg | ORAL_TABLET | Freq: Every day | ORAL | 1 refills | Status: AC

## 2020-06-09 MED ORDER — METFORMIN HCL 1000 MG PO TABS: 1000.0000 mg | ORAL_TABLET | Freq: Two times a day (BID) | ORAL | 1 refills | Status: DC

## 2020-06-09 NOTE — Progress Notes (Signed)
Subjective:    Theodore [name] - 54 y.o. male MRN [id number]  Date of birth: 1966-04-05  HPI  Theodore [name] is here for annual exam. Reports he has a lot going on in his life with his mother in law having mini strokes, daughter just having a baby, and his step daughter getting married this week. Has been unable to work due to his chronic pain. Reports his CBGs are mostly well controlled. Does have some labile blood glucose; reports related to not eating or over eating.      Health Maintenance:  Health Maintenance Due  Topic Date Due  . Hepatitis C Screening  Never done  . COVID-19 Vaccine (1) Never done  . OPHTHALMOLOGY EXAM  03/22/2020  . INFLUENZA VACCINE  04/24/2020    -  reports that he quit smoking about 12 years ago. He has a 30.00 pack-year smoking history. He has quit using smokeless tobacco. - Review of Systems: Per HPI. - Past Medical History: Patient Active Problem List   Diagnosis Date Noted  . Hypoglycemia due to insulin 03/07/2020  . Atherosclerosis of coronary artery of native heart 03/07/2020  . Essential hypertension 11/26/2019  . Neurogenic orthostatic hypotension (HCC) 06/06/2018  . Status post lumbar spine surgery for decompression of spinal cord 06/05/2018  . Paroxysmal atrial fibrillation (HCC) 12/12/2017  . Diabetes mellitus type 2 in nonobese (HCC) 12/12/2017  . Neurocardiogenic syncope 07/23/2014  . Pacemaker 07/23/2014  . Tachycardia 07/23/2014  . Type 2 diabetes mellitus with hyperglycemia (HCC) 10/20/2011  . Sinoatrial bradycardia 10/20/2011  . Depression with anxiety 10/20/2011   - Medications: reviewed and updated   Objective:   Physical Exam BP 133/90   Pulse 79   Temp (!) 97.3 F (36.3 C) (Temporal)   Resp 17   Ht 5\' 8"  (1.727 m)   Wt 163 lb (73.9 kg)   SpO2 96%   BMI 24.78 kg/m  Physical Exam Constitutional:      Appearance: He is not diaphoretic.  HENT:     Head: Normocephalic and atraumatic.  Eyes:      Conjunctiva/sclera: Conjunctivae normal.     Pupils: Pupils are equal, round, and reactive to light.  Neck:     Thyroid: No thyromegaly.  Cardiovascular:     Rate and Rhythm: Normal rate and regular rhythm.     Heart sounds: Normal heart sounds. No murmur heard.   Pulmonary:     Effort: Pulmonary effort is normal. No respiratory distress.     Breath sounds: Normal breath sounds. No wheezing.  Abdominal:     General: Bowel sounds are normal. There is no distension.     Palpations: Abdomen is soft.     Tenderness: There is no abdominal tenderness. There is no guarding or rebound.  Musculoskeletal:        General: No deformity. Normal range of motion.     Cervical back: Normal range of motion and neck supple.  Lymphadenopathy:     Cervical: No cervical adenopathy.  Skin:    General: Skin is warm and dry.     Findings: No rash.  Neurological:     Mental Status: He is alert and oriented to person, place, and time.     Gait: Gait is intact.  Psychiatric:        Mood and Affect: Mood and affect normal.        Judgment: Judgment normal.            Assessment & Plan:  1. Annual physical exam Counseled on 150 minutes of exercise per week, healthy eating (including decreased daily intake of saturated fats, cholesterol, added sugars, sodium), STI prevention, routine healthcare maintenance.  Declines PSA screening.  - CBC with Differential; Future - Comprehensive metabolic panel; Future  2. Need for hepatitis C screening test - HCV Ab w/Rflx to Verification; Future - Hepatitis C Antibody; Future  3. Type 2 diabetes mellitus with other circulatory complication, with long-term current use of insulin (HCC) Continue current insulin regimne. Monitor A1c.  - metFORMIN (GLUCOPHAGE) 1000 MG tablet; Take 1 tablet (1,000 mg total) by mouth 2 (two) times daily with a meal.  Dispense: 180 tablet; Refill: 1 - Hemoglobin A1c; Future  4. Needs flu shot - Flu Vaccine QUAD 6+ mos PF IM  (Fluarix Quad PF)  5. Essential hypertension BP fairly well controlled. Continue regimen.  - lisinopril (ZESTRIL) 5 MG tablet; Take 1 tablet (5 mg total) by mouth daily.  Dispense: 90 tablet; Refill: 1  6. Counseled about COVID-19 virus infection The patient was counseled on the potential benefits and lack of known risks of COVID vaccination on today's visit. The patient's questions and concerns were addressed today, including bieng unsure. The patient is against vaccination. Discussed ways to prevent contracting COVID, including social distancing and masking.    Marcy Siren, D.O. 06/09/2020, 8:52 AM Primary Care at [facility]

## 2020-06-10 ENCOUNTER — Encounter: Payer: BC Managed Care – PPO | Admitting: Internal Medicine

## 2020-06-15 ENCOUNTER — Other Ambulatory Visit: Payer: Self-pay | Admitting: Orthopedic Surgery

## 2020-06-17 ENCOUNTER — Other Ambulatory Visit: Payer: Self-pay | Admitting: Orthopedic Surgery

## 2020-06-17 DIAGNOSIS — G8929 Other chronic pain: Secondary | ICD-10-CM

## 2020-06-17 DIAGNOSIS — M545 Low back pain, unspecified: Secondary | ICD-10-CM

## 2020-06-29 LAB — COMPREHENSIVE METABOLIC PANEL
AG Ratio: 1.8 (calc) (ref 1.0–2.5)
ALT: 14 U/L (ref 9–46)
AST: 16 U/L (ref 10–35)
Albumin: 4.1 g/dL (ref 3.6–5.1)
Alkaline phosphatase (APISO): 61 U/L (ref 35–144)
BUN: 17 mg/dL (ref 7–25)
CO2: 31 mmol/L (ref 20–32)
Calcium: 8.8 mg/dL (ref 8.6–10.3)
Chloride: 102 mmol/L (ref 98–110)
Creat: 0.98 mg/dL (ref 0.70–1.33)
Globulin: 2.3 g/dL (calc) (ref 1.9–3.7)
Glucose, Bld: 147 mg/dL — ABNORMAL HIGH (ref 65–99)
Potassium: 4.4 mmol/L (ref 3.5–5.3)
Sodium: 140 mmol/L (ref 135–146)
Total Bilirubin: 0.5 mg/dL (ref 0.2–1.2)
Total Protein: 6.4 g/dL (ref 6.1–8.1)

## 2020-06-29 LAB — CBC WITH DIFFERENTIAL/PLATELET
Absolute Monocytes: 649 cells/uL (ref 200–950)
Basophils Absolute: 28 cells/uL (ref 0–200)
Basophils Relative: 0.6 %
Eosinophils Absolute: 291 cells/uL (ref 15–500)
Eosinophils Relative: 6.2 %
HCT: 44.9 % (ref 38.5–50.0)
Hemoglobin: 15.6 g/dL (ref 13.2–17.1)
Lymphs Abs: 1518 cells/uL (ref 850–3900)
MCH: 33 pg (ref 27.0–33.0)
MCHC: 34.7 g/dL (ref 32.0–36.0)
MCV: 94.9 fL (ref 80.0–100.0)
MPV: 11.5 fL (ref 7.5–12.5)
Monocytes Relative: 13.8 %
Neutro Abs: 2214 cells/uL (ref 1500–7800)
Neutrophils Relative %: 47.1 %
Platelets: 120 10*3/uL — ABNORMAL LOW (ref 140–400)
RBC: 4.73 10*6/uL (ref 4.20–5.80)
RDW: 12.8 % (ref 11.0–15.0)
Total Lymphocyte: 32.3 %
WBC: 4.7 10*3/uL (ref 3.8–10.8)

## 2020-06-29 LAB — HEMOGLOBIN A1C
Hgb A1c MFr Bld: 8.9 % of total Hgb — ABNORMAL HIGH (ref <5.7)
Mean Plasma Glucose: 209 (calc)
eAG (mmol/L): 11.6 (calc)

## 2020-06-29 LAB — HEPATITIS C ANTIBODY
Hepatitis C Ab: NONREACTIVE
SIGNAL TO CUT-OFF: 0.01 (ref <1.00)

## 2020-06-30 ENCOUNTER — Other Ambulatory Visit: Payer: Self-pay

## 2020-06-30 ENCOUNTER — Other Ambulatory Visit: Payer: BC Managed Care – PPO

## 2020-06-30 ENCOUNTER — Ambulatory Visit
Admission: RE | Admit: 2020-06-30 | Discharge: 2020-06-30 | Disposition: A | Discharge status: Home / Self Care | Payer: BC Managed Care – PPO | Source: Ambulatory Visit | Attending: Orthopedic Surgery | Admitting: Orthopedic Surgery

## 2020-06-30 DIAGNOSIS — G8929 Other chronic pain: Secondary | ICD-10-CM

## 2020-06-30 DIAGNOSIS — M545 Low back pain, unspecified: Secondary | ICD-10-CM

## 2020-06-30 MED ORDER — IOPAMIDOL (ISOVUE-M 200) INJECTION 41%
1.0000 mL | Freq: Once | INTRAMUSCULAR | Status: AC
  Administered 2020-06-30: 1 mL

## 2020-06-30 NOTE — Discharge Instructions (Signed)

## 2020-07-04 NOTE — Progress Notes (Signed)
Patient notified of results & recommendations. Expressed understanding.

## 2020-07-05 ENCOUNTER — Telehealth: Payer: Self-pay

## 2020-07-05 NOTE — Telephone Encounter (Signed)
Copied from CRM [id number]. Topic: General - Other >> Jul 05, 2020  5:21 PM Dalphine Handing A wrote: Patient would like to cancel appt with [facility]

## 2020-07-06 ENCOUNTER — Other Ambulatory Visit: Payer: Self-pay | Admitting: Orthopedic Surgery

## 2020-07-06 ENCOUNTER — Ambulatory Visit: Payer: BC Managed Care – PPO | Admitting: Pharmacist

## 2020-07-06 DIAGNOSIS — M25512 Pain in left shoulder: Secondary | ICD-10-CM

## 2020-07-07 ENCOUNTER — Telehealth: Payer: Self-pay | Admitting: Internal Medicine

## 2020-07-07 DIAGNOSIS — M961 Postlaminectomy syndrome, not elsewhere classified: Secondary | ICD-10-CM

## 2020-07-07 DIAGNOSIS — T819XXA Unspecified complication of procedure, initial encounter: Secondary | ICD-10-CM | POA: Insufficient documentation

## 2020-07-07 HISTORY — DX: Postlaminectomy syndrome, not elsewhere classified: M96.1

## 2020-07-07 NOTE — Telephone Encounter (Signed)
Would you be able to do a surgical clearance form based off of the CPE that he had done in September 2021 or does he need an appointment?

## 2020-07-07 NOTE — Telephone Encounter (Signed)
Pt came and dropped surgery  clearance

## 2020-07-07 NOTE — Telephone Encounter (Signed)
I think the issue is more than his last A1c is 8.9% and I suspect a surgeon would not feel comfortable operating with that. Does his surgeon know about his DM?   Marcy Siren, D.O. Primary Care at [facility]  07/07/2020, 4:40 PM

## 2020-07-11 NOTE — Telephone Encounter (Signed)
Left voice mail to call back 

## 2020-07-18 ENCOUNTER — Other Ambulatory Visit: Payer: Self-pay

## 2020-07-18 ENCOUNTER — Ambulatory Visit
Admission: RE | Admit: 2020-07-18 | Discharge: 2020-07-18 | Disposition: A | Discharge status: Home / Self Care | Payer: BC Managed Care – PPO | Source: Ambulatory Visit | Attending: Orthopedic Surgery | Admitting: Orthopedic Surgery

## 2020-07-18 DIAGNOSIS — M25512 Pain in left shoulder: Secondary | ICD-10-CM

## 2020-07-18 MED ORDER — IOPAMIDOL (ISOVUE-M 200) INJECTION 41%
20.0000 mL | Freq: Once | INTRAMUSCULAR | Status: AC
  Administered 2020-07-18: 20 mL via INTRA_ARTICULAR

## 2020-07-20 NOTE — Telephone Encounter (Signed)
Patient transferred to me from front desk.  Upset & states that the current doctor doesn't know him or his health history. States that he was cleared previously with an A1C of > 11. He says that his surgeon is aware that his A1C is 8.9 & is "pleased" with it.  He started yelling & demanding that the clearance form be complete.

## 2020-07-20 NOTE — Telephone Encounter (Signed)
Pt returning call became upset cursed me out. Sent call to [facility]

## 2020-07-20 NOTE — Telephone Encounter (Signed)
Please ask patient if he would like telemedicine or in-person visit with his PCP to discuss completion of his preoperative clearance form and also let your practice manager know about patient's call and demeanor.

## 2020-07-21 ENCOUNTER — Ambulatory Visit
Admission: RE | Admit: 2020-07-21 | Discharge: 2020-07-21 | Disposition: A | Discharge status: Home / Self Care | Payer: BC Managed Care – PPO | Source: Ambulatory Visit | Attending: Nurse Practitioner | Admitting: Nurse Practitioner

## 2020-07-21 ENCOUNTER — Other Ambulatory Visit: Payer: Self-pay | Admitting: Nurse Practitioner

## 2020-07-21 ENCOUNTER — Other Ambulatory Visit: Payer: Self-pay

## 2020-07-21 DIAGNOSIS — Z01818 Encounter for other preprocedural examination: Secondary | ICD-10-CM

## 2020-07-21 NOTE — Telephone Encounter (Signed)
Patient states that he will be transferring to a different office.

## 2020-08-01 ENCOUNTER — Encounter: Payer: Self-pay | Admitting: *Deleted

## 2020-08-03 ENCOUNTER — Encounter: Payer: Self-pay | Admitting: Diagnostic Neuroimaging

## 2020-08-03 ENCOUNTER — Ambulatory Visit: Payer: BC Managed Care – PPO | Admitting: Diagnostic Neuroimaging

## 2020-08-03 VITALS — BP 112/74 | HR 85 | Ht 68.0 in | Wt 173.0 lb

## 2020-08-03 DIAGNOSIS — M5416 Radiculopathy, lumbar region: Secondary | ICD-10-CM | POA: Diagnosis not present

## 2020-08-03 NOTE — Patient Instructions (Signed)
  LOW BACK PAIN (radiating to right leg) - due to lumbar spinal stenosis at L4-5; also discogenic pain at L5-S1 (based on CT myelogram, discogram and clinical symptoms) - planning for lumbar decompression and fusion per Dr. Shon Baton - no clinical symptoms of meralgia paresthetica; EMG/NCS not indicated at this time

## 2020-08-03 NOTE — Progress Notes (Signed)
[facility]  PATIENT: Theodore [name] DOB: 1966/03/18  REFERRING CLINICIAN: Su Hoff, PA-C HISTORY FROM: patient  REASON FOR VISIT: new consult    HISTORICAL  CHIEF COMPLAINT:  Chief Complaint  Patient presents with  . Radiculopathy Lumbar SPine    rm 7 New Pt "pain on right side from foot up to hip, lower back hurts"     HISTORY OF PRESENT ILLNESS:   54 year old male here for evaluation of low back pain rating to the right leg.  In 2019 patient had low back pain rating to bilateral lower extremities.  He underwent L5-S1 decompression surgery which helped improve symptoms.  In 2020 patient had recurrence of low back pain, this time rating to the right leg, following round of playing golf.  He underwent epidural steroid injection at right side at L5-S1 in August 2020.  Symptoms not improved.  He had L5-S1 discogram which seem to help improve symptoms.  He had CT myogram of the lumbar spine showing degenerative changes at L4-5 and L5-S1.  Patient was initially referred to referred here for evaluation of possible nerve conduction study to evaluate for right meralgia paresthetica.  Patient denies any right anterolateral thigh numbness or discomfort at this time.   REVIEW OF SYSTEMS: Full 14 system review of systems performed and negative with exception of: As per HPI.  ALLERGIES: Allergies  Allergen Reactions  . Cephalexin Hives  . Vancomycin Hives, Itching and Other (See Comments)    True Allergic Reaction -- Not "Red Man's Syndrome Onset of prominent red rash, severe itching/hives Not responsive to Benadryl and IV rate reduction. First seen pre-op for surgery on 06/05/18.    HOME MEDICATIONS: Outpatient Medications Prior to Visit  Medication Sig Dispense Refill  . ACCU-CHEK AVIVA PLUS test strip Check blood sugars twice a day. Dx: E11.59, Z79.4 100 each 2  . citalopram (CELEXA) 20 MG tablet Take 1 tablet (20 mg total) by mouth daily. 90 tablet 0   . insulin aspart (NOVOLOG) 100 UNIT/ML injection Inject 2 Units into the skin 3 (three) times daily with meals. 10 mL 11  . Insulin Glargine (BASAGLAR KWIKPEN) 100 UNIT/ML INJECT 30 UNITS TOTAL INTO THE SKIN DAILY. (Patient taking differently: Inject 30 Units into the skin daily. ) 45 pen 1  . Insulin Pen Needle (B-D UF III MINI PEN NEEDLES) 31G X 5 MM MISC BD Ultra-Fine Short Pen Needle 31 gauge x 5/16"  Use with Basaglar Kwikpen 200 each 1  . lisinopril (ZESTRIL) 5 MG tablet Take 1 tablet (5 mg total) by mouth daily. 90 tablet 1  . metFORMIN (GLUCOPHAGE) 1000 MG tablet Take 1 tablet (1,000 mg total) by mouth 2 (two) times daily with a meal. 180 tablet 1  . clobetasol cream (TEMOVATE) 0.05 % Apply 1 application topically 2 (two) times daily.  (Patient not taking: Reported on 08/03/2020)    . gabapentin (NEURONTIN) 300 MG capsule Take 300 mg by mouth 3 (three) times daily. (Patient not taking: Reported on 08/03/2020)    . metroNIDAZOLE (METROGEL) 0.75 % gel Apply 1 application topically at bedtime. (Patient not taking: Reported on 08/03/2020)    . triamcinolone cream (KENALOG) 0.1 % Apply 1 application topically 2 (two) times daily. (Patient not taking: Reported on 08/03/2020) 30 g 0   No facility-administered medications prior to visit.    PAST MEDICAL HISTORY: Past Medical History:  Diagnosis Date  . Anxiety   . Arthritis   . DDD (degenerative disc disease), lumbar   . Depression   .  Diabetes mellitus    dx 2010  . Lumbar radiculopathy   . Neurogenic claudication due to lumbar spinal stenosis   . Pacemaker    Permanent pacemaker placement by Dr. Molly Maduro McQueen--10/21/2007--a Medtronic Adapta--serial number #[id number]  . Radiculopathy of lumbar region   . Syncope 09/2007   2D Echo--History of frequent episodes of syncope beginning at 54 years of age--usually associated with some type of vagal issue but he does completely lose consciousness    PAST SURGICAL HISTORY: Past Surgical  History:  Procedure Laterality Date  . CARDIAC CATHETERIZATION Right 10/20/2007  . HAND SURGERY    . LUMBAR LAMINECTOMY/DECOMPRESSION MICRODISCECTOMY N/A 06/05/2018   Procedure: Lumbar decompression L4-S1;  Surgeon: [name], MD;  Location: [facility] OR;  Service: Orthopedics;  Laterality: N/A;  3 hrs  . PACEMAKER INSERTION  10/21/2007   Dr Jenne Campus  . SHOULDER SURGERY  1999   arthroscopy  . TONSILLECTOMY      FAMILY HISTORY: Family History  Problem Relation Age of Onset  . Diabetes Sister   . Diabetes Mother        type 2  . Coronary artery disease Mother 53's  . Hypertension Mother   . Stroke Mother     SOCIAL HISTORY: Social History   Socioeconomic History  . Marital status: Married    Spouse name: [name]  . Number of children: Not on file  . Years of education: Not on file  . Highest education level: Not on file  Occupational History  . Occupation: assembly  Tobacco Use  . Smoking status: Former Smoker    Packs/day: 1.00    Years: 30.00    Pack years: 30.00    Quit date: 10/17/2007    Years since quitting: 12.8  . Smokeless tobacco: Former Clinical biochemist  . Vaping Use: Never used  Substance and Sexual Activity  . Alcohol use: No  . Drug use: No  . Sexual activity: Not on file  Other Topics Concern  . Not on file  Social History Narrative   Lives with wife   Social Determinants of Health   Financial Resource Strain:   . Difficulty of Paying Living Expenses: Not on file  Food Insecurity:   . Worried About Programme researcher, broadcasting/film/video in the Last Year: Not on file  . Ran Out of Food in the Last Year: Not on file  Transportation Needs:   . Lack of Transportation (Medical): Not on file  . Lack of Transportation (Non-Medical): Not on file  Physical Activity:   . Days of Exercise per Week: Not on file  . Minutes of Exercise per Session: Not on file  Stress:   . Feeling of Stress : Not on file  Social Connections:   . Frequency of Communication with Friends and  Family: Not on file  . Frequency of Social Gatherings with Friends and Family: Not on file  . Attends Religious Services: Not on file  . Active Member of Clubs or Organizations: Not on file  . Attends Banker Meetings: Not on file  . Marital Status: Not on file  Intimate Partner Violence:   . Fear of Current or Ex-Partner: Not on file  . Emotionally Abused: Not on file  . Physically Abused: Not on file  . Sexually Abused: Not on file     PHYSICAL EXAM  GENERAL EXAM/CONSTITUTIONAL: Vitals:  Vitals:   08/03/20 1035  BP: 112/74  Pulse: 85  Weight: 173 lb (78.5 kg)  Height: 5\' 8"  (1.727  m)     Body mass index is 26.3 kg/m. Wt Readings from Last 3 Encounters:  08/03/20 173 lb (78.5 kg)  06/09/20 163 lb (73.9 kg)  04/12/20 166 lb (75.3 kg)     Patient is in no distress; well developed, nourished and groomed; neck is supple  CARDIOVASCULAR:  Examination of carotid arteries is normal; no carotid bruits  Regular rate and rhythm, no murmurs  Examination of peripheral vascular system by observation and palpation is normal  EYES:  Ophthalmoscopic exam of optic discs and posterior segments is normal; no papilledema or hemorrhages  No exam data present  MUSCULOSKELETAL:  Gait, strength, tone, movements noted in Neurologic exam below  NEUROLOGIC: MENTAL STATUS:  No flowsheet data found.  awake, alert, oriented to person, place and time  recent and remote memory intact  normal attention and concentration  language fluent, comprehension intact, naming intact  fund of knowledge appropriate  CRANIAL NERVE:   2nd - no papilledema on fundoscopic exam  2nd, 3rd, 4th, 6th - pupils equal and reactive to light, visual fields full to confrontation, extraocular muscles intact, no nystagmus  5th - facial sensation symmetric  7th - facial strength symmetric  8th - hearing intact  9th - palate elevates symmetrically, uvula midline  11th - shoulder  shrug symmetric  12th - tongue protrusion midline  MOTOR:   normal bulk and tone, full strength in the BUE, BLE  SENSORY:   normal and symmetric to light touch, temperature, vibration  COORDINATION:   finger-nose-finger, fine finger movements normal  REFLEXES:   deep tendon reflexes TRACE and symmetric  GAIT/STATION:   narrow based gait     DIAGNOSTIC DATA (LABS, IMAGING, TESTING) - I reviewed patient records, labs, notes, testing and imaging myself where available.  Lab Results  Component Value Date   WBC 4.7 06/28/2020   HGB 15.6 06/28/2020   HCT 44.9 06/28/2020   MCV 94.9 06/28/2020   PLT 120 (L) 06/28/2020      Component Value Date/Time   NA 140 06/28/2020 0740   NA 136 10/06/2018 1549   K 4.4 06/28/2020 0740   CL 102 06/28/2020 0740   CO2 31 06/28/2020 0740   GLUCOSE 147 (H) 06/28/2020 0740   BUN 17 06/28/2020 0740   BUN 18 10/06/2018 1549   CREATININE 0.98 06/28/2020 0740   CALCIUM 8.8 06/28/2020 0740   PROT 6.4 06/28/2020 0740   PROT 6.0 10/06/2018 1549   ALBUMIN 3.6 03/07/2020 0639   ALBUMIN 3.4 (L) 10/06/2018 1549   AST 16 06/28/2020 0740   ALT 14 06/28/2020 0740   ALKPHOS 49 03/07/2020 0639   BILITOT 0.5 06/28/2020 0740   BILITOT 0.2 10/06/2018 1549   GFRNONAA >60 03/08/2020 0607   GFRAA >60 03/08/2020 0607   Lab Results  Component Value Date   CHOL 150 12/07/2019   HDL 38 12/07/2019   LDLCALC 40 10/22/2011   TRIG 78 12/07/2019   CHOLHDL 3.9 12/07/2019   Lab Results  Component Value Date   HGBA1C 8.9 (H) 06/28/2020   No results found for: VITAMINB12 Lab Results  Component Value Date   TSH 4.100 10/06/2018     04/08/20 CT lumbar spine [I reviewed images myself and agree with interpretation. Moderate lumbar spinal stenosis at L4-5.  -VRP]  1. Interval laminectomy at L5-S1 with decompression of the posterior canal. 2. Progressive moderate left and mild right subarticular and foraminal stenosis at L4-5 secondary to a  broad-based disc protrusion, advanced facet hypertrophy, and ligamentum flavum  thickening. 3. Mild bilateral foraminal stenosis at L5-S1 secondary to lateral disc protrusions. 4. Slight progression of broad-based disc protrusion and facet hypertrophy at L3-4 with slight impression on the left subarticular space without significant stenosis.    ASSESSMENT AND PLAN  54 y.o. year old male here with:  Dx:  1. Right lumbar radiculopathy     PLAN:  LOW BACK PAIN (radiating to right leg) - due to lumbar spinal stenosis at L4-5; also discogenic pain at L5-S1 (based on CT myelogram, discogram and clinical symptoms) - planning for lumbar decompression and fusion per Dr. Shon BatonBrooks - no clinical symptoms of meralgia paresthetica; EMG/NCS not indicated at this time  Return for return to Dr. Ethelene Halamos, Dr. Shon BatonBrooks.    Suanne MarkerVIKRAM R. Jorje Vanatta, MD 08/03/2020, 11:50 AM Certified in Neurology, Neurophysiology and Neuroimaging  [facility] [address], [address] [place], [place] [address] [phone]

## 2020-08-11 ENCOUNTER — Encounter: Payer: Self-pay | Admitting: Cardiovascular Disease

## 2020-08-11 ENCOUNTER — Other Ambulatory Visit: Payer: Self-pay

## 2020-08-11 ENCOUNTER — Ambulatory Visit (INDEPENDENT_AMBULATORY_CARE_PROVIDER_SITE_OTHER): Payer: BC Managed Care – PPO | Admitting: Cardiovascular Disease

## 2020-08-11 VITALS — BP 120/80 | HR 75 | Ht 68.0 in | Wt 174.0 lb

## 2020-08-11 DIAGNOSIS — I480 Paroxysmal atrial fibrillation: Secondary | ICD-10-CM

## 2020-08-11 DIAGNOSIS — E1165 Type 2 diabetes mellitus with hyperglycemia: Secondary | ICD-10-CM

## 2020-08-11 DIAGNOSIS — Z794 Long term (current) use of insulin: Secondary | ICD-10-CM

## 2020-08-11 DIAGNOSIS — I10 Essential (primary) hypertension: Secondary | ICD-10-CM

## 2020-08-11 DIAGNOSIS — R55 Syncope and collapse: Secondary | ICD-10-CM

## 2020-08-11 DIAGNOSIS — Z950 Presence of cardiac pacemaker: Secondary | ICD-10-CM

## 2020-08-11 NOTE — Patient Instructions (Signed)

## 2020-08-11 NOTE — Progress Notes (Signed)
Patient ID: Theodore [name], male   DOB: [birth year], 54 y.o.   MRN: [id number]    Cardiology Office Note    Date:  08/12/2020   ID:  Theodore [name], DOB September 06, 1966, MRN [id number]  PCP:  Patient, No Pcp Per  Cardiologist:   Thurmon Fair, MD   No chief complaint on file.   History of Present Illness:  Theodore [name] is a 54 y.o. male with a previous history of numerous episodes of syncope associated with prolonged sinus pauses, which completely resolved following implantation of a dual-chamber permanent pacemaker in 2009. He probably had neurally mediated syncope.   His pacemaker recorded an episode of asymptomatic atrial fibrillation that occurred on September 10, 2018 and lasted for over 19 hours.  This was an asymptomatic event and has not recurred since.  Around that date, his mother had an unexpected stroke and died not long afterwards.  His device has recorded other episodes of regular tachycardia in the 160 bpm range, likely representing sinus tachycardia.  Not had any syncopal events.  The patient specifically denies any chest pain at rest exertion, dyspnea at rest or with exertion, orthopnea, paroxysmal nocturnal dyspnea, syncope, palpitations, focal neurological deficits, intermittent claudication, lower extremity edema, unexplained weight gain, cough, hemoptysis or wheezing.  His pacemaker is functioning normally.  His Medtronic Adapta dual-chamber device was implanted in 2009 and has an estimated 3.5 years of remaining longevity.  He only has 3.4% atrial pacing and never requires ventricular pacing.  As mentioned, he has not had any episodes of atrial fibrillation.  The episodes of "mode switch" probably represents sinus tachycardia in the 160s.  Currently his biggest problems are related to pain in his left shoulder and right-sided sciatica.  He is planning to have several other surgery with Dr. Ranell Patrick and back injections or surgery with Dr. Shon Baton in the near future.  [name]'s  triggers for syncope seem to be consistently related to the observation or discussion of health problems and medical procedures. For example he once passed out watching a person wearing oxygen mask and another time passed out discussing a newly diagnosed lung cancer in his uncle. His son has syncope associated with rapid palpitations, probably also neurally mediated.  He has insulin requiring type 2 diabetes mellitus and systemic hypertension both of which appear to be well treated. He is minimally overweight. He had an echocardiogram in 2009 that was a completely normal study. At that time he also had cardiac catheterization that showed normal coronary arteries. 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  Past Medical History:  Diagnosis Date   Anxiety    Arthritis    DDD (degenerative disc disease), lumbar    Depression    Diabetes mellitus    dx 2010   Lumbar radiculopathy    Neurogenic claudication due to lumbar spinal stenosis    Pacemaker    Permanent pacemaker placement by Dr. Molly Maduro McQueen--10/21/2007--a Medtronic Adapta--serial number #[id number]   Radiculopathy of lumbar region    Syncope 09/2007   2D Echo--History of frequent episodes of syncope beginning at 54 years of age--usually associated with some type of vagal issue but he does completely lose consciousness    Past Surgical History:  Procedure Laterality Date   CARDIAC CATHETERIZATION Right 10/20/2007   HAND SURGERY     LUMBAR LAMINECTOMY/DECOMPRESSION MICRODISCECTOMY N/A 06/05/2018   Procedure: Lumbar decompression L4-S1;  Surgeon: [name], MD;  Location: [facility] OR;  Service:  Orthopedics;  Laterality: N/A;  3 hrs   PACEMAKER INSERTION  10/21/2007   Dr Jenne Campus   SHOULDER SURGERY  1999   arthroscopy   TONSILLECTOMY      Current Medications: Outpatient Medications Prior to Visit  Medication Sig Dispense Refill   ACCU-CHEK  AVIVA PLUS test strip Check blood sugars twice a day. Dx: E11.59, Z79.4 100 each 2   citalopram (CELEXA) 20 MG tablet Take 1 tablet (20 mg total) by mouth daily. 90 tablet 0   insulin aspart (NOVOLOG) 100 UNIT/ML injection Inject 2 Units into the skin 3 (three) times daily with meals. 10 mL 11   Insulin Glargine (BASAGLAR KWIKPEN) 100 UNIT/ML INJECT 30 UNITS TOTAL INTO THE SKIN DAILY. (Patient taking differently: Inject 30 Units into the skin daily. ) 45 pen 1   Insulin Pen Needle (B-D UF III MINI PEN NEEDLES) 31G X 5 MM MISC BD Ultra-Fine Short Pen Needle 31 gauge x 5/16"  Use with Basaglar Kwikpen 200 each 1   lisinopril (ZESTRIL) 5 MG tablet Take 1 tablet (5 mg total) by mouth daily. 90 tablet 1   metFORMIN (GLUCOPHAGE) 1000 MG tablet Take 1 tablet (1,000 mg total) by mouth 2 (two) times daily with a meal. 180 tablet 1   clobetasol cream (TEMOVATE) 0.05 % Apply 1 application topically 2 (two) times daily.  (Patient not taking: Reported on 08/03/2020)     gabapentin (NEURONTIN) 300 MG capsule Take 300 mg by mouth 3 (three) times daily. (Patient not taking: Reported on 08/03/2020)     metroNIDAZOLE (METROGEL) 0.75 % gel Apply 1 application topically at bedtime. (Patient not taking: Reported on 08/03/2020)     triamcinolone cream (KENALOG) 0.1 % Apply 1 application topically 2 (two) times daily. (Patient not taking: Reported on 08/03/2020) 30 g 0   No facility-administered medications prior to visit.     Allergies:   Cephalexin and Vancomycin   Social History   Socioeconomic History   Marital status: Married    Spouse name: [name]   Number of children: Not on file   Years of education: Not on file   Highest education level: Not on file  Occupational History   Occupation: assembly  Tobacco Use   Smoking status: Former Smoker    Packs/day: 1.00    Years: 30.00    Pack years: 30.00    Quit date: 10/17/2007    Years since quitting: 12.8   Smokeless tobacco: Former Health and safety inspector Use: Never used  Substance and Sexual Activity   Alcohol use: No   Drug use: No   Sexual activity: Not on file  Other Topics Concern   Not on file  Social History Narrative   Lives with wife   Social Determinants of Health   Financial Resource Strain:    Difficulty of Paying Living Expenses: Not on file  Food Insecurity:    Worried About Programme researcher, broadcasting/film/video in the Last Year: Not on file   [employer] of Food in the Last Year: Not on file  Transportation Needs:    Lack of Transportation (Medical): Not on file   Lack of Transportation (Non-Medical): Not on file  Physical Activity:    Days of Exercise per Week: Not on file   Minutes of Exercise per Session: Not on file  Stress:    Feeling of Stress : Not on file  Social Connections:    Frequency of Communication with Friends and Family: Not on file  Frequency of Social Gatherings with Friends and Family: Not on file   Attends Religious Services: Not on file   Active Member of Clubs or Organizations: Not on file   Attends BankerClub or Organization Meetings: Not on file   Marital Status: Not on file     Family History:  The patient's family history includes Coronary artery disease (age of onset: 3450's) in his mother; Diabetes in his mother and sister; Hypertension in his mother; Stroke in his mother.   ROS:   Please see the history of present illness.    ROS All other systems reviewed and are negative.   PHYSICAL EXAM:   VS:  BP 120/80    Pulse 75    Ht 5\' 8"  (1.727 m)    Wt 174 lb (78.9 kg)    SpO2 97%    BMI 26.46 kg/m      General: Alert, oriented x3, no distress, healthy left subclavian pacemaker site Head: no evidence of trauma, PERRL, EOMI, no exophtalmos or lid lag, no myxedema, no xanthelasma; normal ears, nose and oropharynx Neck: normal jugular venous pulsations and no hepatojugular reflux; brisk carotid pulses without delay and no carotid bruits Chest: clear to auscultation, no  signs of consolidation by percussion or palpation, normal fremitus, symmetrical and full respiratory excursions Cardiovascular: normal position and quality of the apical impulse, regular rhythm, normal first and second heart sounds, no murmurs, rubs or gallops Abdomen: no tenderness or distention, no masses by palpation, no abnormal pulsatility or arterial bruits, normal bowel sounds, no hepatosplenomegaly Extremities: no clubbing, cyanosis or edema; 2+ radial, ulnar and brachial pulses bilaterally; 2+ right femoral, posterior tibial and dorsalis pedis pulses; 2+ left femoral, posterior tibial and dorsalis pedis pulses; no subclavian or femoral bruits Neurological: grossly nonfocal Psych: Normal mood and affect    Wt Readings from Last 3 Encounters:  08/11/20 174 lb (78.9 kg)  08/03/20 173 lb (78.5 kg)  06/09/20 163 lb (73.9 kg)      Studies/Labs Reviewed:   EKG:  EKG is ordered today.  The ECG shows normal sinus rhythm and is a completely normal tracing.  Recent Labs: 06/28/2020: ALT 14; BUN 17; Creat 0.98; Hemoglobin 15.6; Platelets 120; Potassium 4.4; Sodium 140   Lipid Panel    Component Value Date/Time   CHOL 150 12/07/2019 0000   TRIG 78 12/07/2019 0000   HDL 38 12/07/2019 0000   CHOLHDL 3.9 12/07/2019 0000   VLDL 72 (H) 10/22/2011 0507   LDLCALC 40 10/22/2011 0507      ASSESSMENT:    1. Paroxysmal atrial fibrillation (HCC)   2. Essential hypertension   3. Neurocardiogenic syncope   4. Pacemaker   5. Type 2 diabetes mellitus with hyperglycemia, with long-term current use of insulin (HCC)      PLAN:  In order of problems listed above:  1. AFib: Had 1 episode during emotional distress that has not recurred.  Low embolic risk. CHADSVasc 1 (DM).  No history of systemic embolism.  Discussed stroke risk.  Prefers aspirin which I think is quite reasonable.  He can stop the aspirin for the planned back injections or surgery.  He does not require antiarrhythmics. 2. HTN:  Controlled.  Avoid diuretics. 3. Syncope: Highly suggestive of a neurally mediated cardioinhibitory syncope.  No recurrence since pacemaker implantation.  Occasional episodes of "anxiety" and rapid heartbeats are probably the prodrome of neurally mediated events that never reach fruition. 4. PM: Normal device function.  He has changed his smart phone and  has been unable to perform remote downloads.  Will reach out to the Medtronic representative in our device clinic to help with that. 5. DM: Less than ideal control with the most recent hemoglobin A1c of 8.9%.  The lipid profile was not bad with a total cholesterol 150, HDL 38 and triglycerides 78. Calculated LDL is close to target (less than 100)    Medication Adjustments/Labs and Tests Ordered: Current medicines are reviewed at length with the patient today.  Concerns regarding medicines are outlined above.  Medication changes, Labs and Tests ordered today are listed in the Patient Instructions below. Patient Instructions  Medication Instructions:  No changes *If you need a refill on your cardiac medications before your next appointment, please call your pharmacy*   Lab Work: None ordered If you have labs (blood work) drawn today and your tests are completely normal, you will receive your results only by:  MyChart Message (if you have MyChart) OR  A paper copy in the mail If you have any lab test that is abnormal or we need to change your treatment, we will call you to review the results.   Testing/Procedures: None ordered   Follow-Up: At [facility], you and your health needs are our priority.  As part of our continuing mission to provide you with exceptional heart care, we have created designated Provider Care Teams.  These Care Teams include your primary Cardiologist (physician) and Advanced Practice Providers (APPs -  Physician Assistants and Nurse Practitioners) who all work together to provide you with the care you need, when  you need it.  We recommend signing up for the patient portal called "MyChart".  Sign up information is provided on this After Visit Summary.  MyChart is used to connect with patients for Virtual Visits (Telemedicine).  Patients are able to view lab/test results, encounter notes, upcoming appointments, etc.  Non-urgent messages can be sent to your provider as well.   To learn more about what you can do with MyChart, go to [email or web address].    Your next appointment:   12 month(s)  The format for your next appointment:   In Person  Provider:   Thurmon Fair, MD     Signed, Thurmon Fair, MD  08/12/2020 3:37 PM    [facility] [address] [place], [place], [place]  [address] Phone: [phone]; Fax: [phone]

## 2020-08-12 ENCOUNTER — Ambulatory Visit (INDEPENDENT_AMBULATORY_CARE_PROVIDER_SITE_OTHER): Payer: BC Managed Care – PPO

## 2020-08-12 ENCOUNTER — Ambulatory Visit: Payer: Self-pay | Admitting: Orthopedic Surgery

## 2020-08-12 ENCOUNTER — Telehealth: Payer: Self-pay

## 2020-08-12 DIAGNOSIS — R001 Bradycardia, unspecified: Secondary | ICD-10-CM | POA: Diagnosis not present

## 2020-08-12 NOTE — Telephone Encounter (Signed)
Transmission received.

## 2020-08-12 NOTE — Telephone Encounter (Signed)
I called the pt and he thinks he figured it out. He is going to try and give me a call back.

## 2020-08-12 NOTE — Telephone Encounter (Signed)
-----   Message from Thurmon Fair, MD sent at 08/12/2020  3:37 PM EST ----- He got a new smart phone (android) and he can no longer access the app to do his device downloads.  He has a Medtronic Adapta, but was using a hand-held wand, paired to his phone, to do remote transmissions.  I suspect he may have downloaded the wrong app or that the new phone needs to be paired up with his device.  Could we help him with this, please?

## 2020-08-14 LAB — CUP PACEART REMOTE DEVICE CHECK
Battery Impedance: 1832 Ohm
Battery Remaining Longevity: 43 mo
Battery Voltage: 2.76 V
Brady Statistic AP VP Percent: 0 %
Brady Statistic AP VS Percent: 8 %
Brady Statistic AS VP Percent: 0 %
Brady Statistic AS VS Percent: 92 %
Date Time Interrogation Session: 20211119165628
Implantable Lead Implant Date: 20090127
Implantable Lead Implant Date: 20090127
Implantable Lead Location: 753859
Implantable Lead Location: 753860
Implantable Lead Model: 5076
Implantable Lead Model: 5076
Implantable Pulse Generator Implant Date: 20090127
Lead Channel Impedance Value: 474 Ohm
Lead Channel Impedance Value: 603 Ohm
Lead Channel Pacing Threshold Amplitude: 0.5 V
Lead Channel Pacing Threshold Amplitude: 0.625 V
Lead Channel Pacing Threshold Pulse Width: 0.4 ms
Lead Channel Pacing Threshold Pulse Width: 0.4 ms
Lead Channel Setting Pacing Amplitude: 2 V
Lead Channel Setting Pacing Amplitude: 2.5 V
Lead Channel Setting Pacing Pulse Width: 0.4 ms
Lead Channel Setting Sensing Sensitivity: 5.6 mV

## 2020-08-15 NOTE — Progress Notes (Signed)
Remote pacemaker transmission.   

## 2020-08-16 ENCOUNTER — Ambulatory Visit: Payer: Self-pay | Admitting: Orthopedic Surgery
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
## 2020-08-22 ENCOUNTER — Other Ambulatory Visit: Payer: Self-pay

## 2020-08-23 ENCOUNTER — Encounter: Payer: Self-pay | Admitting: Vascular Surgery

## 2020-08-23 ENCOUNTER — Ambulatory Visit: Payer: BC Managed Care – PPO | Admitting: Vascular Surgery

## 2020-08-23 ENCOUNTER — Other Ambulatory Visit: Payer: Self-pay

## 2020-08-23 DIAGNOSIS — M5441 Lumbago with sciatica, right side: Secondary | ICD-10-CM

## 2020-08-23 DIAGNOSIS — M549 Dorsalgia, unspecified: Secondary | ICD-10-CM | POA: Insufficient documentation

## 2020-08-23 DIAGNOSIS — G8929 Other chronic pain: Secondary | ICD-10-CM | POA: Diagnosis not present

## 2020-08-23 NOTE — Progress Notes (Signed)
Patient name: Theodore [name] MRN: [id number] DOB: 01-14-1966 Sex: male  REASON FOR CONSULT: Evaluate L5-S1 ALIF  HPI: Theodore [name] is a 54 y.o. male, with history of degenerative disc disease, diabetes, bradycardia status post pacemaker that presents for evaluation of L5-S1 ALIF with Dr. Shon Baton.  Patient states he was doing well until about 5 to 6 months ago when he was swinging a golf club and had sudden lower back pain with radiculopathy down the right leg.  Ultimately has been evaluated by Dr. Shon Baton who has recommended a anterior posterior L5-S1 approach.  He does have a history of lumbar decompression from L4-S1 that was done in 2019 also by Dr. Shon Baton and states after that surgery he got a lot better until his incident on the golf course about 5 to 6 months ago.  He has no prior history of abdominal surgery.  He quit smoking years ago.  He is currently on short-term disability but previously worked in heating and air.  Past Medical History:  Diagnosis Date  . Anxiety   . Arthritis   . DDD (degenerative disc disease), lumbar   . Depression   . Diabetes mellitus    dx 2010  . Lumbar radiculopathy   . Neurogenic claudication due to lumbar spinal stenosis   . Pacemaker    Permanent pacemaker placement by Dr. Molly Maduro McQueen--10/21/2007--a Medtronic Adapta--serial number #[id number]  . Radiculopathy of lumbar region   . Syncope 09/2007   2D Echo--History of frequent episodes of syncope beginning at 54 years of age--usually associated with some type of vagal issue but he does completely lose consciousness    Past Surgical History:  Procedure Laterality Date  . CARDIAC CATHETERIZATION Right 10/20/2007  . HAND SURGERY    . LUMBAR LAMINECTOMY/DECOMPRESSION MICRODISCECTOMY N/A 06/05/2018   Procedure: Lumbar decompression L4-S1;  Surgeon: [name], MD;  Location: [facility] OR;  Service: Orthopedics;  Laterality: N/A;  3 hrs  . PACEMAKER INSERTION  10/21/2007   Dr Jenne Campus  . SHOULDER  SURGERY  1999   arthroscopy  . TONSILLECTOMY      Family History  Problem Relation Age of Onset  . Diabetes Sister   . Diabetes Mother        type 2  . Coronary artery disease Mother 70's  . Hypertension Mother   . Stroke Mother     SOCIAL HISTORY: Social History   Socioeconomic History  . Marital status: Married    Spouse name: [name]  . Number of children: Not on file  . Years of education: Not on file  . Highest education level: Not on file  Occupational History  . Occupation: assembly  Tobacco Use  . Smoking status: Former Smoker    Packs/day: 1.00    Years: 30.00    Pack years: 30.00    Quit date: 10/17/2007    Years since quitting: 12.8  . Smokeless tobacco: Former Clinical biochemist  . Vaping Use: Never used  Substance and Sexual Activity  . Alcohol use: No  . Drug use: No  . Sexual activity: Not on file  Other Topics Concern  . Not on file  Social History Narrative   Lives with wife   Social Determinants of Health   Financial Resource Strain:   . Difficulty of Paying Living Expenses: Not on file  Food Insecurity:   . Worried About Programme researcher, broadcasting/film/video in the Last Year: Not on file  . Ran Out of Food in the Last Year:  Not on file  Transportation Needs:   . Lack of Transportation (Medical): Not on file  . Lack of Transportation (Non-Medical): Not on file  Physical Activity:   . Days of Exercise per Week: Not on file  . Minutes of Exercise per Session: Not on file  Stress:   . Feeling of Stress : Not on file  Social Connections:   . Frequency of Communication with Friends and Family: Not on file  . Frequency of Social Gatherings with Friends and Family: Not on file  . Attends Religious Services: Not on file  . Active Member of Clubs or Organizations: Not on file  . Attends Banker Meetings: Not on file  . Marital Status: Not on file  Intimate Partner Violence:   . Fear of Current or Ex-Partner: Not on file  . Emotionally Abused: Not on  file  . Physically Abused: Not on file  . Sexually Abused: Not on file    Allergies  Allergen Reactions  . Cephalexin Hives  . Vancomycin Hives, Itching and Other (See Comments)    True Allergic Reaction -- Not "Red Man's Syndrome Onset of prominent red rash, severe itching/hives Not responsive to Benadryl and IV rate reduction. First seen pre-op for surgery on 06/05/18.    Current Outpatient Medications  Medication Sig Dispense Refill  . ACCU-CHEK AVIVA PLUS test strip Check blood sugars twice a day. Dx: E11.59, Z79.4 100 each 2  . citalopram (CELEXA) 20 MG tablet Take 1 tablet (20 mg total) by mouth daily. 90 tablet 0  . insulin aspart (NOVOLOG) 100 UNIT/ML injection Inject 2 Units into the skin 3 (three) times daily with meals. 10 mL 11  . Insulin Glargine (BASAGLAR KWIKPEN) 100 UNIT/ML INJECT 30 UNITS TOTAL INTO THE SKIN DAILY. (Patient taking differently: Inject 30 Units into the skin daily. ) 45 pen 1  . Insulin Pen Needle (B-D UF III MINI PEN NEEDLES) 31G X 5 MM MISC BD Ultra-Fine Short Pen Needle 31 gauge x 5/16"  Use with Basaglar Kwikpen 200 each 1  . lisinopril (ZESTRIL) 5 MG tablet Take 1 tablet (5 mg total) by mouth daily. 90 tablet 1  . metFORMIN (GLUCOPHAGE) 1000 MG tablet Take 1 tablet (1,000 mg total) by mouth 2 (two) times daily with a meal. 180 tablet 1   No current facility-administered medications for this visit.    REVIEW OF SYSTEMS:  [X]  denotes positive finding, [ ]  denotes negative finding Cardiac  Comments:  Chest pain or chest pressure:    Shortness of breath upon exertion:    Short of breath when lying flat:    Irregular heart rhythm:        Vascular    Pain in calf, thigh, or hip brought on by ambulation:    Pain in feet at night that wakes you up from your sleep:     Blood clot in your veins:    Leg swelling:         Pulmonary    Oxygen at home:    Productive cough:     Wheezing:         Neurologic    Sudden weakness in arms or legs:      Sudden numbness in arms or legs:     Sudden onset of difficulty speaking or slurred speech:    Temporary loss of vision in one eye:     Problems with dizziness:         Gastrointestinal    Blood in stool:  Vomited blood:         Genitourinary    Burning when urinating:     Blood in urine:        Psychiatric    Major depression:         Hematologic    Bleeding problems:    Problems with blood clotting too easily:        Skin    Rashes or ulcers:        Constitutional    Fever or chills:      PHYSICAL EXAM: Vitals:   08/23/20 0814  BP: 135/84  Pulse: 90  Resp: 18  Temp: 98 F (36.7 C)  TempSrc: Temporal  SpO2: 96%  Weight: 171 lb (77.6 kg)  Height: 5\' 8"  (1.727 m)    GENERAL: The patient is a well-nourished male, in no acute distress. The vital signs are documented above. CARDIAC: There is a regular rate and rhythm.  VASCULAR:  Palpable radial pulses bilaterally Palpable femoral pulses bilaterally Palpable dorsalis pedis and posterior tibial pulses bilaterally PULMONARY: No respiratory distress ABDOMEN: Soft and non-tender.  No abdominal incisions MUSCULOSKELETAL: There are no major deformities or cyanosis. NEUROLOGIC: No focal weakness or paresthesias are detected. SKIN: There are no ulcers or rashes noted. PSYCHIATRIC: The patient has a normal affect.  DATA:   I independently reviewed his CT L-spine from 04/08/2020 and aortic bifurcation is at the bottom of L4 with minimal atherosclerosis.  The iliac vein bifurcation is at the L4-L5 disc space.  Assessment/Plan:  54 year old male with degenerative disc disease and chronic lower back pain with radiculopathy that presents for preoperative evaluation of L5-S1 ALIF with Dr. 57.  Ultimately I reviewed his CT lumbar spine from July 2021 and I think he would be an excellent candidate for anterior approach.  We discussed transverse incision over the left rectus muscle with mobilization of the left rectus  and entering the retroperitoneal space to mobilize the peritoneum and intestines as well as the left ureter across midline.  We discussed mobilizing iliac artery and veins.  Discussed possible injury to the above structures.  Look forward to assisting Dr. August 2021.   Shon Baton, MD [facility] [place] [facility] Office: [phone]

## 2020-09-02 ENCOUNTER — Encounter: Payer: Self-pay | Admitting: Cardiovascular Disease

## 2020-09-02 NOTE — Progress Notes (Signed)
Periop device orders sent to periop device via [employer]. Also, device rep emailed of patient's surgery, date, and time.

## 2020-09-02 NOTE — Progress Notes (Signed)
Your procedure is scheduled on Tuesday, December 14th.  Report to [facility] Main Entrance "A" at 5:30 A.M., and check in at the Admitting office.  Call this number if you have problems the morning of surgery:  [phone]  Call [phone] if you have any questions prior to your surgery date Monday-Friday 8am-4pm   Remember:  Do not eat or drink after midnight the night before your surgery    Take these medicines the morning of surgery with A SIP OF WATER  atorvastatin (LIPITOR) sertraline (ZOLOFT)   As of today, STOP taking any Aspirin (unless otherwise instructed by your surgeon) Aleve, Naproxen, Ibuprofen, Motrin, Advil, Goody's, BC's, all herbal medications, fish oil, and all vitamins.         WHAT DO I DO ABOUT MY DIABETES MEDICATION?  ---The night before surgery--- Take 15 units of Insulin Glargine (BASAGLAR KWIKPEN) (if normally taken at night)  ---The morning of surgery--- Take 15 units of Insulin Glargine [facility])  (if normally taken in the morning)  . If your CBG is greater than 220 mg/dL, you may take  of your sliding scale (correction) dose of insulin aspart (NOVOLOG).  HOW TO MANAGE YOUR DIABETES BEFORE AND AFTER SURGERY  Why is it important to control my blood sugar before and after surgery? . Improving blood sugar levels before and after surgery helps healing and can limit problems. . A way of improving blood sugar control is eating a healthy diet by: o  Eating less sugar and carbohydrates o  Increasing activity/exercise o  Talking with your doctor about reaching your blood sugar goals . High blood sugars (greater than 180 mg/dL) can raise your risk of infections and slow your recovery, so you will need to focus on controlling your diabetes during the weeks before surgery. . Make sure that the doctor who takes care of your diabetes knows about your planned surgery including the date and location.  How do I manage my blood sugar before  surgery? . Check your blood sugar at least 4 times a day, starting 2 days before surgery, to make sure that the level is not too high or low. . Check your blood sugar the morning of your surgery when you wake up and every 2 hours until you get to the Short Stay unit. o If your blood sugar is less than 70 mg/dL, you will need to treat for low blood sugar: - Do not take insulin. - Treat a low blood sugar (less than 70 mg/dL) with  cup of clear juice (cranberry or apple), 4 glucose tablets, OR glucose gel. - Recheck blood sugar in 15 minutes after treatment (to make sure it is greater than 70 mg/dL). If your blood sugar is not greater than 70 mg/dL on recheck, call [phone] for further instructions. . Report your blood sugar to the short stay nurse when you get to Short Stay.  . If you are admitted to the hospital after surgery: o Your blood sugar will be checked by the staff and you will probably be given insulin after surgery (instead of oral diabetes medicines) to make sure you have good blood sugar levels. o The goal for blood sugar control after surgery is 80-180 mg/dL.             Do not wear jewelry            Do not wear lotions, powders, colognes, or deodorant.            Men may shave  face and neck.            Do not bring valuables to the hospital.            [facility] is not responsible for any belongings or valuables.  Do NOT Smoke (Tobacco/Vaping) or drink Alcohol 24 hours prior to your procedure If you use a CPAP at night, you may bring all equipment for your overnight stay.   Contacts, glasses, dentures or bridgework may not be worn into surgery.      For patients admitted to the hospital, discharge time will be determined by your treatment team.   Patients discharged the day of surgery will not be allowed to drive home, and someone needs to stay with them for 24 hours.  Special instructions:   [place]- Preparing For Surgery  Before surgery, you can play an  important role. Because skin is not sterile, your skin needs to be as free of germs as possible. You can reduce the number of germs on your skin by washing with CHG (chlorahexidine gluconate) Soap before surgery.  CHG is an antiseptic cleaner which kills germs and bonds with the skin to continue killing germs even after washing.    Oral Hygiene is also important to reduce your risk of infection.  Remember - BRUSH YOUR TEETH THE MORNING OF SURGERY WITH YOUR REGULAR TOOTHPASTE  Please do not use if you have an allergy to CHG or antibacterial soaps. If your skin becomes reddened/irritated stop using the CHG.  Do not shave (including legs and underarms) for at least 48 hours prior to first CHG shower. It is OK to shave your face.  Please follow these instructions carefully.   1. Shower the NIGHT BEFORE SURGERY and the MORNING OF SURGERY with CHG Soap.   2. If you chose to wash your hair, wash your hair first as usual with your normal shampoo.  3. After you shampoo, rinse your hair and body thoroughly to remove the shampoo.  4. Use CHG as you would any other liquid soap. You can apply CHG directly to the skin and wash gently with a scrungie or a clean washcloth.   5. Apply the CHG Soap to your body ONLY FROM THE NECK DOWN.  Do not use on open wounds or open sores. Avoid contact with your eyes, ears, mouth and genitals (private parts). Wash Face and genitals (private parts)  with your normal soap.   6. Wash thoroughly, paying special attention to the area where your surgery will be performed.  7. Thoroughly rinse your body with warm water from the neck down.  8. DO NOT shower/wash with your normal soap after using and rinsing off the CHG Soap.  9. Pat yourself dry with a CLEAN TOWEL.  10. Wear CLEAN PAJAMAS to bed the night before surgery  11. Place CLEAN SHEETS on your bed the night of your first shower and DO NOT SLEEP WITH PETS.  Day of Surgery: Wear Clean/Comfortable clothing the  morning of surgery Do not apply any deodorants/lotions.   Remember to brush your teeth WITH YOUR REGULAR TOOTHPASTE.   Please read over the following fact sheets that you were given.

## 2020-09-02 NOTE — Progress Notes (Unsigned)
PERIOPERATIVE PRESCRIPTION FOR IMPLANTED CARDIAC DEVICE PROGRAMMING  Patient Information: Name:  Theodore [name]  DOB:  1966/02/20  MRN:  [id number]  {TIP - You do not have to delete this tip  -  Copy the info from the staff message sent by the PAT staff  then press F2 here and paste the information using CTL - V on the next line :[id number]}  Planned Procedure: Anterior lumbar interbody fusion  Surgeon: Dr. [name] and Dr. Sherald Hess  Date of Procedure: 09/08/2020  Cautery will be used.  Position during surgery: Supine/Prone - not sure   Please send documentation back to:  [facility] (Fax # [phone])   Device Information:  Clinic EP Physician:  Thurmon Fair, MD   Device Type:  Pacemaker Manufacturer and Phone #:  Medtronic: [id number] Pacemaker Dependent?:  No. Date of Last Device Check:  08/14/20 Normal Device Function?:  Yes.    Electrophysiologist's Recommendations:   Have magnet available.  Provide continuous ECG monitoring when magnet is used or reprogramming is to be performed.   Procedure may interfere with device function.  Magnet should be placed over device during procedure. (If supine)  If patient is prone the device will need to be reprogrammed.  Per Device Clinic Standing Orders, Lenor Coffin, RN  3:36 PM 09/02/2020

## 2020-09-05 ENCOUNTER — Encounter (HOSPITAL_COMMUNITY): Payer: Self-pay

## 2020-09-05 ENCOUNTER — Other Ambulatory Visit (HOSPITAL_COMMUNITY)
Admission: RE | Admit: 2020-09-05 | Discharge: 2020-09-05 | Disposition: A | Discharge status: Home / Self Care | Payer: BC Managed Care – PPO | Source: Ambulatory Visit | Attending: Orthopedic Surgery | Admitting: Orthopedic Surgery

## 2020-09-05 ENCOUNTER — Other Ambulatory Visit: Payer: Self-pay

## 2020-09-05 ENCOUNTER — Encounter (HOSPITAL_COMMUNITY)
Admission: RE | Admit: 2020-09-05 | Discharge: 2020-09-05 | Disposition: A | Discharge status: Home / Self Care | Payer: BC Managed Care – PPO | Source: Ambulatory Visit | Attending: Orthopedic Surgery | Admitting: Orthopedic Surgery

## 2020-09-05 ENCOUNTER — Ambulatory Visit (HOSPITAL_COMMUNITY)
Admission: RE | Admit: 2020-09-05 | Discharge: 2020-09-05 | Disposition: A | Discharge status: Home / Self Care | Payer: BC Managed Care – PPO | Source: Ambulatory Visit | Attending: Orthopedic Surgery | Admitting: Orthopedic Surgery

## 2020-09-05 DIAGNOSIS — Z01812 Encounter for preprocedural laboratory examination: Secondary | ICD-10-CM | POA: Insufficient documentation

## 2020-09-05 DIAGNOSIS — Z01811 Encounter for preprocedural respiratory examination: Secondary | ICD-10-CM

## 2020-09-05 DIAGNOSIS — Z20822 Contact with and (suspected) exposure to covid-19: Secondary | ICD-10-CM | POA: Insufficient documentation

## 2020-09-05 LAB — PROTIME-INR
INR: 1.1 (ref 0.8–1.2)
Prothrombin Time: 13.5 seconds (ref 11.4–15.2)

## 2020-09-05 LAB — SURGICAL PCR SCREEN
MRSA, PCR: NEGATIVE
Staphylococcus aureus: NEGATIVE

## 2020-09-05 LAB — URINALYSIS, COMPLETE (UACMP) WITH MICROSCOPIC
Bacteria, UA: NONE SEEN
Bilirubin Urine: NEGATIVE
Glucose, UA: NEGATIVE mg/dL
Hgb urine dipstick: NEGATIVE
Ketones, ur: NEGATIVE mg/dL
Leukocytes,Ua: NEGATIVE
Nitrite: NEGATIVE
Protein, ur: NEGATIVE mg/dL
Specific Gravity, Urine: 1.014 (ref 1.005–1.030)
pH: 5 (ref 5.0–8.0)

## 2020-09-05 LAB — COMPREHENSIVE METABOLIC PANEL
ALT: 17 U/L (ref 0–44)
AST: 19 U/L (ref 15–41)
Albumin: 4 g/dL (ref 3.5–5.0)
Alkaline Phosphatase: 47 U/L (ref 38–126)
Anion gap: 9 (ref 5–15)
BUN: 13 mg/dL (ref 6–20)
CO2: 26 mmol/L (ref 22–32)
Calcium: 9.2 mg/dL (ref 8.9–10.3)
Chloride: 102 mmol/L (ref 98–111)
Creatinine, Ser: 1.06 mg/dL (ref 0.61–1.24)
GFR, Estimated: >60 mL/min (ref >60)
Glucose, Bld: 121 mg/dL — ABNORMAL HIGH (ref 70–99)
Potassium: 4.3 mmol/L (ref 3.5–5.1)
Sodium: 137 mmol/L (ref 135–145)
Total Bilirubin: 1 mg/dL (ref 0.3–1.2)
Total Protein: 6.9 g/dL (ref 6.5–8.1)

## 2020-09-05 LAB — CBC
HCT: 48 % (ref 39.0–52.0)
Hemoglobin: 16.3 g/dL (ref 13.0–17.0)
MCH: 32.1 pg (ref 26.0–34.0)
MCHC: 34 g/dL (ref 30.0–36.0)
MCV: 94.7 fL (ref 80.0–100.0)
Platelets: 161 10*3/uL (ref 150–400)
RBC: 5.07 MIL/uL (ref 4.22–5.81)
RDW: 12 % (ref 11.5–15.5)
WBC: 5.7 10*3/uL (ref 4.0–10.5)
nRBC: 0 % (ref 0.0–0.2)

## 2020-09-05 LAB — TYPE AND SCREEN
ABO/RH(D): O NEG
Antibody Screen: NEGATIVE

## 2020-09-05 LAB — APTT: aPTT: 32 seconds (ref 24–36)

## 2020-09-05 LAB — GLUCOSE, CAPILLARY: Glucose-Capillary: 128 mg/dL — ABNORMAL HIGH (ref 70–99)

## 2020-09-05 LAB — SARS CORONAVIRUS 2 (TAT 6-24 HRS): SARS Coronavirus 2: NEGATIVE

## 2020-09-05 NOTE — Progress Notes (Signed)
   09/05/20 0914  OBSTRUCTIVE SLEEP APNEA  Have you ever been diagnosed with sleep apnea through a sleep study? No  Do you snore loudly (loud enough to be heard through closed doors)?  1  Do you often feel tired, fatigued, or sleepy during the daytime (such as falling asleep during driving or talking to someone)? 0  Has anyone observed you stop breathing during your sleep? 1  Do you have, or are you being treated for high blood pressure? 1  BMI more than 35 kg/m2? 0  Age > 50 (1-yes) 1  Neck circumference greater than:Male 16 inches or larger, Male 17inches or larger? 0  Male Gender (Yes=1) 1  Obstructive Sleep Apnea Score 5   Patient tested for sleep apnea back in 2019, negative for sleep apnea no CPAP recommended per patient

## 2020-09-05 NOTE — Progress Notes (Addendum)
Your procedure is scheduled on Thursday, December 16th.  Report to [facility] Main Entrance "A" at 5:30 A.M., and check in at the Admitting office.  Call this number if you have problems the morning of surgery:  [phone]  Call [phone] if you have any questions prior to your surgery date Monday-Friday 8am-4pm   Remember:  Do not eat or drink after midnight the night before your surgery    Take these medicines the morning of surgery with A SIP OF WATER  atorvastatin (LIPITOR) sertraline (ZOLOFT)   As of today, STOP taking any Aspirin (unless otherwise instructed by your surgeon) Aleve, Naproxen, Ibuprofen, Motrin, Advil, Goody's, BC's, all herbal medications, fish oil, and all vitamins.         WHAT DO I DO ABOUT MY DIABETES MEDICATION?  ---The night before surgery--- Take 15 units of Insulin Glargine (BASAGLAR KWIKPEN) (if normally taken at night)  ---The morning of surgery--- Take 15 units of Insulin Glargine [facility])  (if normally taken in the morning)  . If your CBG is greater than 220 mg/dL, you may take  of your sliding scale (correction) dose of insulin aspart (NOVOLOG).  HOW TO MANAGE YOUR DIABETES BEFORE AND AFTER SURGERY  Why is it important to control my blood sugar before and after surgery? . Improving blood sugar levels before and after surgery helps healing and can limit problems. . A way of improving blood sugar control is eating a healthy diet by: o  Eating less sugar and carbohydrates o  Increasing activity/exercise o  Talking with your doctor about reaching your blood sugar goals . High blood sugars (greater than 180 mg/dL) can raise your risk of infections and slow your recovery, so you will need to focus on controlling your diabetes during the weeks before surgery. . Make sure that the doctor who takes care of your diabetes knows about your planned surgery including the date and location.  How do I manage my blood sugar before  surgery? . Check your blood sugar at least 4 times a day, starting 2 days before surgery, to make sure that the level is not too high or low. . Check your blood sugar the morning of your surgery when you wake up and every 2 hours until you get to the Short Stay unit. o If your blood sugar is less than 70 mg/dL, you will need to treat for low blood sugar: - Do not take insulin. - Treat a low blood sugar (less than 70 mg/dL) with  cup of clear juice (cranberry or apple), 4 glucose tablets, OR glucose gel. - Recheck blood sugar in 15 minutes after treatment (to make sure it is greater than 70 mg/dL). If your blood sugar is not greater than 70 mg/dL on recheck, call [phone] for further instructions. . Report your blood sugar to the short stay nurse when you get to Short Stay.  . If you are admitted to the hospital after surgery: o Your blood sugar will be checked by the staff and you will probably be given insulin after surgery (instead of oral diabetes medicines) to make sure you have good blood sugar levels. o The goal for blood sugar control after surgery is 80-180 mg/dL.             Do not wear jewelry            Do not wear lotions, powders, colognes, or deodorant.            Men may shave  face and neck.            Do not bring valuables to the hospital.            [facility] is not responsible for any belongings or valuables.  Do NOT Smoke (Tobacco/Vaping) or drink Alcohol 24 hours prior to your procedure If you use a CPAP at night, you may bring all equipment for your overnight stay.   Contacts, glasses, dentures or bridgework may not be worn into surgery.      For patients admitted to the hospital, discharge time will be determined by your treatment team.   Patients discharged the day of surgery will not be allowed to drive home, and someone needs to stay with them for 24 hours.  Special instructions:   [place]- Preparing For Surgery  Before surgery, you can play an  important role. Because skin is not sterile, your skin needs to be as free of germs as possible. You can reduce the number of germs on your skin by washing with CHG (chlorahexidine gluconate) Soap before surgery.  CHG is an antiseptic cleaner which kills germs and bonds with the skin to continue killing germs even after washing.    Oral Hygiene is also important to reduce your risk of infection.  Remember - BRUSH YOUR TEETH THE MORNING OF SURGERY WITH YOUR REGULAR TOOTHPASTE  Please do not use if you have an allergy to CHG or antibacterial soaps. If your skin becomes reddened/irritated stop using the CHG.  Do not shave (including legs and underarms) for at least 48 hours prior to first CHG shower. It is OK to shave your face.  Please follow these instructions carefully.   1. Shower the NIGHT BEFORE SURGERY and the MORNING OF SURGERY with CHG Soap.   2. If you chose to wash your hair, wash your hair first as usual with your normal shampoo.  3. After you shampoo, rinse your hair and body thoroughly to remove the shampoo.  4. Use CHG as you would any other liquid soap. You can apply CHG directly to the skin and wash gently with a scrungie or a clean washcloth.   5. Apply the CHG Soap to your body ONLY FROM THE NECK DOWN.  Do not use on open wounds or open sores. Avoid contact with your eyes, ears, mouth and genitals (private parts). Wash Face and genitals (private parts)  with your normal soap.   6. Wash thoroughly, paying special attention to the area where your surgery will be performed.  7. Thoroughly rinse your body with warm water from the neck down.  8. DO NOT shower/wash with your normal soap after using and rinsing off the CHG Soap.  9. Pat yourself dry with a CLEAN TOWEL.  10. Wear CLEAN PAJAMAS to bed the night before surgery  11. Place CLEAN SHEETS on your bed the night of your first shower and DO NOT SLEEP WITH PETS.  Day of Surgery: Wear Clean/Comfortable clothing the  morning of surgery Do not apply any deodorants/lotions.   Remember to brush your teeth WITH YOUR REGULAR TOOTHPASTE.   Please read over the following fact sheets that you were given.

## 2020-09-05 NOTE — Progress Notes (Signed)
[facility] - [facility]  Cardiologist - [facility]  PPM/ICD - Medtronic - e-mail sent by Ro to Medtr[employer] rep,  Device Orders - received back printed placed on shadow chart Rep Notified -  email sent by Ro to medtronic rep,- rep will need to be present for procedure, patient will be positioned supine at first but will be flipped for posterior part of procedure.   Last device check - 08/12/20  Chest x-ray - 09/05/20 EKG - 08/11/20 Stress Test - denies ECHO - 2009 Cardiac Cath - 2009  Sleep Study - 2019 negative for sleep apnea CPAP - no  Fasting Blood Sugar - 120s-140s Checks Blood Sugar __3___ times a day  Blood Thinner Instructions: n/a Aspirin Instructions: n/a  COVID TEST- 09/05/20  Anesthesia review: yes, ICD/PPM Medtronic  Patient denies shortness of breath, fever, cough and chest pain at PAT appointment   All instructions explained to the patient, with a verbal understanding of the material. Patient agrees to go over the instructions while at home for a better understanding. Patient also instructed to self quarantine after being tested for COVID-19. The opportunity to ask questions was provided.

## 2020-09-06 NOTE — Anesthesia Preprocedure Evaluation (Addendum)
Anesthesia Evaluation  Patient identified by MRN, date of birth, ID band Patient awake    Reviewed: Allergy & Precautions, NPO status , Patient's Chart, lab work & pertinent test results  Airway Mallampati: II  TM Distance: >3 FB     Dental   Pulmonary former smoker,    breath sounds clear to auscultation       Cardiovascular hypertension, + CAD  + pacemaker  Rhythm:Regular Rate:Normal     Neuro/Psych    GI/Hepatic negative GI ROS, Neg liver ROS,   Endo/Other  diabetes  Renal/GU negative Renal ROS     Musculoskeletal   Abdominal   Peds  Hematology   Anesthesia Other Findings   Reproductive/Obstetrics                            Anesthesia Physical Anesthesia Plan  ASA: III  Anesthesia Plan: General   Post-op Pain Management:    Induction: Intravenous  PONV Risk Score and Plan: 2 and Ondansetron, Dexamethasone and Midazolam  Airway Management Planned: Oral ETT  Additional Equipment:   Intra-op Plan:   Post-operative Plan: Possible Post-op intubation/ventilation  Informed Consent: I have reviewed the patients History and Physical, chart, labs and discussed the procedure including the risks, benefits and alternatives for the proposed anesthesia with the patient or authorized representative who has indicated his/her understanding and acceptance.     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  Last seen by Dr. Royann Shivers 08/11/20, normal device function, discussed upcoming  surgery and said ASA could be stopped if needed. Formal clearance by Dr. Royann Shivers in letter dated 08/11/20, "[name] is at low risk, from a cardiac standpoint, for the upcoming procedures.  He has a dual chamber Medtronic pacemaker that Is functioning normally (checked today). He is not pacemaker dependent. Device reprorgramming is not necessary before his surgery."  IDDMII, last A1c 8.9 on 06/28/20.  Preop labs reviewed, unremarkable.   EKG 09/05/20: NSR. Rate 70.  Periop device instructions per note 09/02/20: Device Information:  Clinic EP Physician:  Thurmon Fair, MD   Device Type:  Pacemaker Manufacturer and Phone #:  Medtronic: [phone] Pacemaker Dependent?:  No. Date of Last Device Check:  08/14/20         Normal Device Function?:  Yes.    Electrophysiologist's Recommendations:  Have magnet available. Provide continuous ECG monitoring when magnet is used or reprogramming is to be performed.  Procedure may interfere with device function.  Magnet should be placed over device during procedure. (If supine) If patient is prone the device will need to be reprogrammed.  Cardiac cath 10/20/07: Impression:  Normal coronaries. Normal left ventricular systolic function.  Echo 10/20/07: SUMMARY - Overall left ventricular systolic function was normal. There was    no diagnostic evidence of left ventricular regional wall    motion abnormalities.  )      Anesthesia Quick Evaluation

## 2020-09-06 NOTE — Progress Notes (Signed)
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  Last seen by Dr. Royann Shivers 08/11/20, normal device function, discussed upcoming surgery and said ASA could be stopped if needed. Formal clearance by Dr. Royann Shivers in letter dated 08/11/20, "Theodore [name] is at low risk, from a cardiac standpoint, for the upcoming procedures.  He has a dual chamber Medtronic pacemaker that Is functioning normally (checked today). He is not pacemaker dependent. Device reprorgramming is not necessary before his surgery."  IDDMII, last A1c 8.9 on 06/28/20.  Preop labs reviewed, unremarkable.   EKG 09/05/20: NSR. Rate 70.  Periop device instructions per note 09/02/20: Device Information:  Clinic EP Physician:  Thurmon Fair, MD   Device Type:  Pacemaker Manufacturer and Phone #:  Medtronic: [phone] Pacemaker Dependent?:  No. Date of Last Device Check:  08/14/20         Normal Device Function?:  Yes.    Electrophysiologist's Recommendations:   Have magnet available.  Provide continuous ECG monitoring when magnet is used or reprogramming is to be performed.   Procedure may interfere with device function.  Magnet should be placed over device during procedure. (If supine)  If patient is prone the device will need to be reprogrammed.  Cardiac cath 10/20/07: Impression:  Normal coronaries. Normal left ventricular systolic function.  Echo 10/20/07: SUMMARY - Overall left ventricular systolic function was normal. There was    no diagnostic evidence of left ventricular regional wall    motion abnormalities.   [facility] Short Stay Center/Anesthesiology Phone [phone] 09/06/2020 1:36 PM

## 2020-09-08 ENCOUNTER — Inpatient Hospital Stay (HOSPITAL_COMMUNITY)
Admission: RE | Admit: 2020-09-08 | Discharge: 2020-09-10 | DRG: 455 | Disposition: A | Discharge status: Home / Self Care | Payer: BC Managed Care – PPO | Attending: Orthopedic Surgery | Admitting: Orthopedic Surgery

## 2020-09-08 ENCOUNTER — Inpatient Hospital Stay (HOSPITAL_COMMUNITY): Payer: BC Managed Care – PPO | Admitting: Anesthesiology

## 2020-09-08 ENCOUNTER — Inpatient Hospital Stay (HOSPITAL_COMMUNITY): Payer: BC Managed Care – PPO | Admitting: Physician Assistant

## 2020-09-08 ENCOUNTER — Ambulatory Visit: Payer: BC Managed Care – PPO | Admitting: Internal Medicine

## 2020-09-08 ENCOUNTER — Other Ambulatory Visit: Payer: Self-pay

## 2020-09-08 ENCOUNTER — Ambulatory Visit: Payer: Self-pay | Admitting: Orthopedic Surgery

## 2020-09-08 ENCOUNTER — Inpatient Hospital Stay (HOSPITAL_COMMUNITY): Payer: BC Managed Care – PPO

## 2020-09-08 ENCOUNTER — Encounter (HOSPITAL_COMMUNITY): Payer: Self-pay | Admitting: Orthopedic Surgery

## 2020-09-08 ENCOUNTER — Inpatient Hospital Stay (HOSPITAL_COMMUNITY)
Admission: RE | Disposition: A | Discharge status: Home / Self Care | Payer: Self-pay | Source: Home / Self Care | Attending: Orthopedic Surgery

## 2020-09-08 DIAGNOSIS — M5137 Other intervertebral disc degeneration, lumbosacral region: Secondary | ICD-10-CM | POA: Diagnosis not present

## 2020-09-08 DIAGNOSIS — Z419 Encounter for procedure for purposes other than remedying health state, unspecified: Secondary | ICD-10-CM

## 2020-09-08 DIAGNOSIS — M2578 Osteophyte, vertebrae: Secondary | ICD-10-CM | POA: Diagnosis present

## 2020-09-08 DIAGNOSIS — E119 Type 2 diabetes mellitus without complications: Secondary | ICD-10-CM | POA: Diagnosis present

## 2020-09-08 DIAGNOSIS — Z79899 Other long term (current) drug therapy: Secondary | ICD-10-CM | POA: Diagnosis not present

## 2020-09-08 DIAGNOSIS — Z981 Arthrodesis status: Secondary | ICD-10-CM | POA: Diagnosis not present

## 2020-09-08 DIAGNOSIS — Z87891 Personal history of nicotine dependence: Secondary | ICD-10-CM | POA: Diagnosis not present

## 2020-09-08 DIAGNOSIS — M5117 Intervertebral disc disorders with radiculopathy, lumbosacral region: Secondary | ICD-10-CM | POA: Diagnosis present

## 2020-09-08 DIAGNOSIS — M961 Postlaminectomy syndrome, not elsewhere classified: Secondary | ICD-10-CM | POA: Diagnosis present

## 2020-09-08 DIAGNOSIS — Z7984 Long term (current) use of oral hypoglycemic drugs: Secondary | ICD-10-CM | POA: Diagnosis not present

## 2020-09-08 DIAGNOSIS — I2510 Atherosclerotic heart disease of native coronary artery without angina pectoris: Secondary | ICD-10-CM | POA: Diagnosis present

## 2020-09-08 DIAGNOSIS — I10 Essential (primary) hypertension: Secondary | ICD-10-CM | POA: Diagnosis present

## 2020-09-08 DIAGNOSIS — Z950 Presence of cardiac pacemaker: Secondary | ICD-10-CM | POA: Diagnosis not present

## 2020-09-08 DIAGNOSIS — Z794 Long term (current) use of insulin: Secondary | ICD-10-CM | POA: Diagnosis not present

## 2020-09-08 DIAGNOSIS — M48062 Spinal stenosis, lumbar region with neurogenic claudication: Secondary | ICD-10-CM | POA: Diagnosis present

## 2020-09-08 DIAGNOSIS — Z20822 Contact with and (suspected) exposure to covid-19: Secondary | ICD-10-CM | POA: Diagnosis present

## 2020-09-08 HISTORY — PX: HARVEST BONE GRAFT: SHX377

## 2020-09-08 HISTORY — PX: ABDOMINAL EXPOSURE: SHX5708

## 2020-09-08 HISTORY — PX: ANTERIOR LUMBAR FUSION: SHX1170

## 2020-09-08 LAB — CREATININE, SERUM
Creatinine, Ser: 1.04 mg/dL (ref 0.61–1.24)
GFR, Estimated: >60 mL/min (ref >60)

## 2020-09-08 LAB — CBC
HCT: 37.3 % — ABNORMAL LOW (ref 39.0–52.0)
Hemoglobin: 13.4 g/dL (ref 13.0–17.0)
MCH: 33 pg (ref 26.0–34.0)
MCHC: 35.9 g/dL (ref 30.0–36.0)
MCV: 91.9 fL (ref 80.0–100.0)
Platelets: 142 10*3/uL — ABNORMAL LOW (ref 150–400)
RBC: 4.06 MIL/uL — ABNORMAL LOW (ref 4.22–5.81)
RDW: 12 % (ref 11.5–15.5)
WBC: 12.4 10*3/uL — ABNORMAL HIGH (ref 4.0–10.5)
nRBC: 0 % (ref 0.0–0.2)

## 2020-09-08 LAB — ABO/RH: ABO/RH(D): O NEG

## 2020-09-08 LAB — HEMOGLOBIN A1C
Hgb A1c MFr Bld: 7.4 % — ABNORMAL HIGH (ref 4.8–5.6)
Mean Plasma Glucose: 165.68 mg/dL

## 2020-09-08 LAB — GLUCOSE, CAPILLARY
Glucose-Capillary: 116 mg/dL — ABNORMAL HIGH (ref 70–99)
Glucose-Capillary: 171 mg/dL — ABNORMAL HIGH (ref 70–99)
Glucose-Capillary: 261 mg/dL — ABNORMAL HIGH (ref 70–99)
Glucose-Capillary: 280 mg/dL — ABNORMAL HIGH (ref 70–99)

## 2020-09-08 SURGERY — ANTERIOR LUMBAR FUSION 1 LEVEL
Anesthesia: General | Site: Hip

## 2020-09-08 MED ORDER — HEMOSTATIC AGENTS (NO CHARGE) OPTIME
TOPICAL | Status: DC | PRN
  Administered 2020-09-08 (×2): 1 via TOPICAL

## 2020-09-08 MED ORDER — FENTANYL CITRATE (PF) 100 MCG/2ML IJ SOLN
25.0000 ug | INTRAMUSCULAR | Status: DC | PRN
  Administered 2020-09-08: 25 ug via INTRAVENOUS
  Administered 2020-09-08: 50 ug via INTRAVENOUS

## 2020-09-08 MED ORDER — SUGAMMADEX SODIUM 200 MG/2ML IV SOLN
INTRAVENOUS | Status: DC | PRN
  Administered 2020-09-08: 200 mg via INTRAVENOUS

## 2020-09-08 MED ORDER — LACTATED RINGERS IV SOLN: INTRAVENOUS | Status: DC | PRN

## 2020-09-08 MED ORDER — MENTHOL 3 MG MT LOZG: 1.0000 | LOZENGE | OROMUCOSAL | Status: DC | PRN

## 2020-09-08 MED ORDER — SERTRALINE HCL 50 MG PO TABS
50.0000 mg | ORAL_TABLET | Freq: Every day | ORAL | Status: DC
  Administered 2020-09-09 – 2020-09-10 (×2): 50 mg via ORAL
  Filled 2020-09-08 (×2): qty 1

## 2020-09-08 MED ORDER — PHENOL 1.4 % MT LIQD: 1.0000 | OROMUCOSAL | Status: DC | PRN

## 2020-09-08 MED ORDER — PROPOFOL 10 MG/ML IV BOLUS
INTRAVENOUS | Status: AC
  Filled 2020-09-08: qty 20

## 2020-09-08 MED ORDER — CEFAZOLIN SODIUM-DEXTROSE 2-3 GM-%(50ML) IV SOLR
INTRAVENOUS | Status: DC | PRN
  Administered 2020-09-08: 2 g via INTRAVENOUS

## 2020-09-08 MED ORDER — ROCURONIUM 10MG/ML (10ML) SYRINGE FOR MEDFUSION PUMP - OPTIME
INTRAVENOUS | Status: DC | PRN
  Administered 2020-09-08 (×2): 50 mg via INTRAVENOUS

## 2020-09-08 MED ORDER — LIDOCAINE HCL (CARDIAC) PF 100 MG/5ML IV SOSY
PREFILLED_SYRINGE | INTRAVENOUS | Status: DC | PRN
  Administered 2020-09-08: 100 mg via INTRAVENOUS

## 2020-09-08 MED ORDER — THROMBIN 20000 UNITS EX SOLR
CUTANEOUS | Status: DC | PRN
  Administered 2020-09-08: 20000 [IU] via TOPICAL

## 2020-09-08 MED ORDER — CHLORHEXIDINE GLUCONATE 4 % EX LIQD: 60.0000 mL | Freq: Once | CUTANEOUS | Status: DC

## 2020-09-08 MED ORDER — SUFENTANIL CITRATE 50 MCG/ML IV SOLN
INTRAVENOUS | Status: DC | PRN
  Administered 2020-09-08: 10 ug via INTRAVENOUS
  Administered 2020-09-08: 20 ug via INTRAVENOUS
  Administered 2020-09-08: 10 ug via INTRAVENOUS
  Administered 2020-09-08: 20 ug via INTRAVENOUS
  Administered 2020-09-08: 10 ug via INTRAVENOUS

## 2020-09-08 MED ORDER — SUCCINYLCHOLINE CHLORIDE 200 MG/10ML IV SOSY
PREFILLED_SYRINGE | INTRAVENOUS | Status: AC
  Filled 2020-09-08: qty 10

## 2020-09-08 MED ORDER — GABAPENTIN 300 MG PO CAPS
300.0000 mg | ORAL_CAPSULE | Freq: Three times a day (TID) | ORAL | Status: DC
  Administered 2020-09-08 – 2020-09-10 (×5): 300 mg via ORAL
  Filled 2020-09-08 (×5): qty 1

## 2020-09-08 MED ORDER — MORPHINE SULFATE (PF) 2 MG/ML IV SOLN
2.0000 mg | INTRAVENOUS | Status: AC | PRN
Start: 2020-09-08 — End: 2020-09-09
  Administered 2020-09-08 (×2): 2 mg via INTRAVENOUS
  Filled 2020-09-08: qty 1

## 2020-09-08 MED ORDER — LACTATED RINGERS IV SOLN: INTRAVENOUS | Status: DC

## 2020-09-08 MED ORDER — SUFENTANIL CITRATE 50 MCG/ML IV SOLN
INTRAVENOUS | Status: AC
  Filled 2020-09-08: qty 1

## 2020-09-08 MED ORDER — INSULIN ASPART 100 UNIT/ML ~~LOC~~ SOLN: 0.0000 [IU] | Freq: Every day | SUBCUTANEOUS | Status: DC

## 2020-09-08 MED ORDER — THROMBIN (RECOMBINANT) 20000 UNITS EX SOLR
CUTANEOUS | Status: AC
  Filled 2020-09-08: qty 20000

## 2020-09-08 MED ORDER — ACETAMINOPHEN 325 MG PO TABS: 650.0000 mg | ORAL_TABLET | ORAL | Status: DC | PRN

## 2020-09-08 MED ORDER — DEXAMETHASONE SODIUM PHOSPHATE 10 MG/ML IJ SOLN
INTRAMUSCULAR | Status: DC | PRN
  Administered 2020-09-08: 10 mg via INTRAVENOUS

## 2020-09-08 MED ORDER — ALBUMIN HUMAN 5 % IV SOLN: INTRAVENOUS | Status: DC | PRN

## 2020-09-08 MED ORDER — BUPIVACAINE-EPINEPHRINE (PF) 0.25% -1:200000 IJ SOLN
INTRAMUSCULAR | Status: DC | PRN
  Administered 2020-09-08: 20 mL
  Administered 2020-09-08: 10 mL

## 2020-09-08 MED ORDER — SUCCINYLCHOLINE 20MG/ML (10ML) SYRINGE FOR MEDFUSION PUMP - OPTIME
INTRAMUSCULAR | Status: DC | PRN
  Administered 2020-09-08: 120 mg via INTRAVENOUS

## 2020-09-08 MED ORDER — ACETAMINOPHEN 650 MG RE SUPP: 650.0000 mg | RECTAL | Status: DC | PRN

## 2020-09-08 MED ORDER — BUPIVACAINE LIPOSOME 1.3 % IJ SUSP
20.0000 mL | INTRAMUSCULAR | Status: AC
  Administered 2020-09-08: 20 mL
  Filled 2020-09-08: qty 20

## 2020-09-08 MED ORDER — INSULIN GLARGINE 100 UNIT/ML ~~LOC~~ SOLN
30.0000 [IU] | Freq: Every day | SUBCUTANEOUS | Status: DC
  Administered 2020-09-09 – 2020-09-10 (×2): 30 [IU] via SUBCUTANEOUS
  Filled 2020-09-08 (×3): qty 0.3

## 2020-09-08 MED ORDER — SODIUM CHLORIDE 0.9% FLUSH: 3.0000 mL | INTRAVENOUS | Status: DC | PRN

## 2020-09-08 MED ORDER — POLYETHYLENE GLYCOL 3350 17 G PO PACK
17.0000 g | PACK | Freq: Every day | ORAL | Status: DC | PRN
  Administered 2020-09-09: 17 g via ORAL
  Filled 2020-09-08: qty 1

## 2020-09-08 MED ORDER — OXYCODONE HCL 5 MG PO TABS
10.0000 mg | ORAL_TABLET | ORAL | Status: DC | PRN
  Administered 2020-09-08 – 2020-09-10 (×7): 10 mg via ORAL
  Filled 2020-09-08 (×8): qty 2

## 2020-09-08 MED ORDER — ATORVASTATIN CALCIUM 10 MG PO TABS
10.0000 mg | ORAL_TABLET | Freq: Every day | ORAL | Status: DC
  Administered 2020-09-09 – 2020-09-10 (×2): 10 mg via ORAL
  Filled 2020-09-08 (×2): qty 1

## 2020-09-08 MED ORDER — CEFAZOLIN SODIUM-DEXTROSE 2-4 GM/100ML-% IV SOLN
INTRAVENOUS | Status: AC
  Filled 2020-09-08: qty 100

## 2020-09-08 MED ORDER — MORPHINE SULFATE (PF) 2 MG/ML IV SOLN
INTRAVENOUS | Status: AC
  Filled 2020-09-08: qty 1

## 2020-09-08 MED ORDER — MIDAZOLAM HCL 2 MG/2ML IJ SOLN
INTRAMUSCULAR | Status: AC
  Filled 2020-09-08: qty 2

## 2020-09-08 MED ORDER — GLYCOPYRROLATE 0.2 MG/ML IJ SOLN
INTRAMUSCULAR | Status: DC | PRN
  Administered 2020-09-08: .2 mg via INTRAVENOUS

## 2020-09-08 MED ORDER — METFORMIN HCL 500 MG PO TABS
1000.0000 mg | ORAL_TABLET | Freq: Two times a day (BID) | ORAL | Status: DC
  Administered 2020-09-08 – 2020-09-10 (×4): 1000 mg via ORAL
  Filled 2020-09-08 (×4): qty 2

## 2020-09-08 MED ORDER — ONDANSETRON HCL 4 MG PO TABS: 4.0000 mg | ORAL_TABLET | Freq: Three times a day (TID) | ORAL | 0 refills | Status: DC | PRN

## 2020-09-08 MED ORDER — OXYCODONE-ACETAMINOPHEN 10-325 MG PO TABS: 1.0000 | ORAL_TABLET | Freq: Four times a day (QID) | ORAL | 0 refills | Status: AC | PRN

## 2020-09-08 MED ORDER — INSULIN ASPART 100 UNIT/ML ~~LOC~~ SOLN
0.0000 [IU] | Freq: Three times a day (TID) | SUBCUTANEOUS | Status: DC
  Administered 2020-09-09 (×2): 8 [IU] via SUBCUTANEOUS
  Administered 2020-09-09 – 2020-09-10 (×3): 3 [IU] via SUBCUTANEOUS

## 2020-09-08 MED ORDER — ENOXAPARIN SODIUM 40 MG/0.4ML ~~LOC~~ SOLN: 40.0000 mg | SUBCUTANEOUS | 0 refills | Status: DC

## 2020-09-08 MED ORDER — GLYCOPYRROLATE PF 0.2 MG/ML IJ SOSY
PREFILLED_SYRINGE | INTRAMUSCULAR | Status: AC
  Filled 2020-09-08: qty 1

## 2020-09-08 MED ORDER — FENTANYL CITRATE (PF) 100 MCG/2ML IJ SOLN
INTRAMUSCULAR | Status: AC
  Administered 2020-09-08: 25 ug via INTRAVENOUS
  Filled 2020-09-08: qty 2

## 2020-09-08 MED ORDER — CEFAZOLIN SODIUM-DEXTROSE 1-4 GM/50ML-% IV SOLN
1.0000 g | Freq: Three times a day (TID) | INTRAVENOUS | Status: AC
  Administered 2020-09-08 – 2020-09-09 (×2): 1 g via INTRAVENOUS
  Filled 2020-09-08 (×2): qty 50

## 2020-09-08 MED ORDER — METHOCARBAMOL 500 MG PO TABS: 500.0000 mg | ORAL_TABLET | Freq: Three times a day (TID) | ORAL | 0 refills | Status: AC | PRN

## 2020-09-08 MED ORDER — LIDOCAINE 2% (20 MG/ML) 5 ML SYRINGE
INTRAMUSCULAR | Status: AC
  Filled 2020-09-08: qty 5

## 2020-09-08 MED ORDER — INSULIN ASPART 100 UNIT/ML ~~LOC~~ SOLN: 0.0000 [IU] | Freq: Three times a day (TID) | SUBCUTANEOUS | Status: DC

## 2020-09-08 MED ORDER — PROPOFOL 10 MG/ML IV BOLUS
INTRAVENOUS | Status: DC | PRN
  Administered 2020-09-08: 180 mg via INTRAVENOUS

## 2020-09-08 MED ORDER — SODIUM CHLORIDE 0.9% FLUSH
3.0000 mL | Freq: Two times a day (BID) | INTRAVENOUS | Status: DC
  Administered 2020-09-08: 3 mL via INTRAVENOUS

## 2020-09-08 MED ORDER — METHOCARBAMOL 1000 MG/10ML IJ SOLN
500.0000 mg | Freq: Four times a day (QID) | INTRAVENOUS | Status: DC | PRN
  Filled 2020-09-08: qty 5

## 2020-09-08 MED ORDER — BUPIVACAINE-EPINEPHRINE (PF) 0.25% -1:200000 IJ SOLN
INTRAMUSCULAR | Status: AC
  Filled 2020-09-08: qty 10

## 2020-09-08 MED ORDER — ROCURONIUM BROMIDE 10 MG/ML (PF) SYRINGE
PREFILLED_SYRINGE | INTRAVENOUS | Status: AC
  Filled 2020-09-08: qty 10

## 2020-09-08 MED ORDER — METHOCARBAMOL 500 MG PO TABS
ORAL_TABLET | ORAL | Status: AC
  Filled 2020-09-08: qty 1

## 2020-09-08 MED ORDER — 0.9 % SODIUM CHLORIDE (POUR BTL) OPTIME
TOPICAL | Status: DC | PRN
  Administered 2020-09-08 (×2): 1000 mL

## 2020-09-08 MED ORDER — HEMOSTATIC AGENTS (NO CHARGE) OPTIME
TOPICAL | Status: DC | PRN
  Administered 2020-09-08: 1

## 2020-09-08 MED ORDER — BUPIVACAINE-EPINEPHRINE (PF) 0.25% -1:200000 IJ SOLN
INTRAMUSCULAR | Status: AC
  Filled 2020-09-08: qty 20

## 2020-09-08 MED ORDER — ACETAMINOPHEN 10 MG/ML IV SOLN
INTRAVENOUS | Status: DC | PRN
  Administered 2020-09-08: 1000 mg via INTRAVENOUS

## 2020-09-08 MED ORDER — LISINOPRIL 5 MG PO TABS
5.0000 mg | ORAL_TABLET | Freq: Every day | ORAL | Status: DC
  Administered 2020-09-08 – 2020-09-10 (×2): 5 mg via ORAL
  Filled 2020-09-08 (×3): qty 1

## 2020-09-08 MED ORDER — CHLORHEXIDINE GLUCONATE 0.12 % MT SOLN: 15.0000 mL | Freq: Once | OROMUCOSAL | Status: DC

## 2020-09-08 MED ORDER — INSULIN ASPART 100 UNIT/ML ~~LOC~~ SOLN
0.0000 [IU] | Freq: Every day | SUBCUTANEOUS | Status: DC
  Administered 2020-09-08: 3 [IU] via SUBCUTANEOUS

## 2020-09-08 MED ORDER — ENOXAPARIN SODIUM 40 MG/0.4ML ~~LOC~~ SOLN
40.0000 mg | SUBCUTANEOUS | Status: DC
  Administered 2020-09-09 – 2020-09-10 (×2): 40 mg via SUBCUTANEOUS
  Filled 2020-09-08 (×2): qty 0.4

## 2020-09-08 MED ORDER — MIDAZOLAM HCL 2 MG/2ML IJ SOLN
INTRAMUSCULAR | Status: DC | PRN
  Administered 2020-09-08: 2 mg via INTRAVENOUS

## 2020-09-08 MED ORDER — PROPOFOL 500 MG/50ML IV EMUL
INTRAVENOUS | Status: DC | PRN
  Administered 2020-09-08: 100 ug/kg/min via INTRAVENOUS

## 2020-09-08 MED ORDER — ONDANSETRON HCL 4 MG PO TABS: 4.0000 mg | ORAL_TABLET | Freq: Four times a day (QID) | ORAL | Status: DC | PRN

## 2020-09-08 MED ORDER — ONDANSETRON HCL 4 MG/2ML IJ SOLN
INTRAMUSCULAR | Status: AC
  Filled 2020-09-08: qty 2

## 2020-09-08 MED ORDER — METHOCARBAMOL 500 MG PO TABS
500.0000 mg | ORAL_TABLET | Freq: Four times a day (QID) | ORAL | Status: DC | PRN
  Administered 2020-09-08 – 2020-09-10 (×6): 500 mg via ORAL
  Filled 2020-09-08 (×6): qty 1

## 2020-09-08 MED ORDER — ORAL CARE MOUTH RINSE: 15.0000 mL | Freq: Once | OROMUCOSAL | Status: DC

## 2020-09-08 MED ORDER — OXYCODONE HCL 5 MG PO TABS
5.0000 mg | ORAL_TABLET | ORAL | Status: DC | PRN
Start: 2020-09-08 — End: 2020-09-10
  Administered 2020-09-09: 5 mg via ORAL
  Filled 2020-09-08: qty 1

## 2020-09-08 MED ORDER — ONDANSETRON HCL 4 MG/2ML IJ SOLN
INTRAMUSCULAR | Status: DC | PRN
  Administered 2020-09-08: 4 mg via INTRAVENOUS

## 2020-09-08 MED ORDER — ONDANSETRON HCL 4 MG/2ML IJ SOLN
4.0000 mg | Freq: Four times a day (QID) | INTRAMUSCULAR | Status: DC | PRN
  Administered 2020-09-08: 4 mg via INTRAVENOUS
  Filled 2020-09-08: qty 2

## 2020-09-08 MED ORDER — INSULIN ASPART 100 UNIT/ML ~~LOC~~ SOLN: 2.0000 [IU] | Freq: Three times a day (TID) | SUBCUTANEOUS | Status: DC

## 2020-09-08 MED ORDER — PHENYLEPHRINE HCL-NACL 10-0.9 MG/250ML-% IV SOLN
INTRAVENOUS | Status: DC | PRN
  Administered 2020-09-08: 50 ug/min via INTRAVENOUS

## 2020-09-08 MED ORDER — DEXAMETHASONE SODIUM PHOSPHATE 10 MG/ML IJ SOLN
INTRAMUSCULAR | Status: AC
  Filled 2020-09-08: qty 1

## 2020-09-08 MED ORDER — SODIUM CHLORIDE (PF) 0.9 % IJ SOLN
INTRAMUSCULAR | Status: AC
  Filled 2020-09-08: qty 10

## 2020-09-08 MED ORDER — MAGNESIUM CITRATE PO SOLN
1.0000 | Freq: Once | ORAL | Status: AC | PRN
  Administered 2020-09-09: 1 via ORAL
  Filled 2020-09-08: qty 296

## 2020-09-08 SURGICAL SUPPLY — 79 items
APPLIER CLIP 11 MED OPEN (CLIP) ×4
BLADE CLIPPER SURG (BLADE) ×4 IMPLANT
BLADE SURG 10 STRL SS (BLADE) ×8 IMPLANT
BUR EGG ELITE 4.0 (BURR) IMPLANT
CABLE BIPOLOR RESECTION CORD (MISCELLANEOUS) ×4 IMPLANT
CAGE FUSION NL 12D XL 16 (Cage) ×4 IMPLANT
CATH FOLEY 2WAY SLVR  5CC 16FR (CATHETERS) ×4
CATH FOLEY 2WAY SLVR 5CC 16FR (CATHETERS) ×3 IMPLANT
CLIP APPLIE 11 MED OPEN (CLIP) ×3 IMPLANT
CLIP NEUROVISION LG (CLIP) ×4 IMPLANT
CLSR STERI-STRIP ANTIMIC 1/2X4 (GAUZE/BANDAGES/DRESSINGS) ×4 IMPLANT
COVER SURGICAL LIGHT HANDLE (MISCELLANEOUS) ×8 IMPLANT
COVER WAND RF STERILE (DRAPES) IMPLANT
DRAPE C-ARM 42X72 X-RAY (DRAPES) ×8 IMPLANT
DRAPE C-ARMOR (DRAPES) ×8 IMPLANT
DRAPE INCISE IOBAN 66X45 STRL (DRAPES) ×4 IMPLANT
DRAPE LAPAROTOMY T 102X78X121 (DRAPES) ×4 IMPLANT
DRAPE POUCH INSTRU U-SHP 10X18 (DRAPES) ×8 IMPLANT
DRAPE SURG 17X23 STRL (DRAPES) ×4 IMPLANT
DRAPE U-SHAPE 47X51 STRL (DRAPES) ×12 IMPLANT
DRSG OPSITE POSTOP 4X8 (GAUZE/BANDAGES/DRESSINGS) ×8 IMPLANT
DRSG TEGADERM 2-3/8X2-3/4 SM (GAUZE/BANDAGES/DRESSINGS) ×4 IMPLANT
DRSG TEGADERM 4X4.75 (GAUZE/BANDAGES/DRESSINGS) ×8 IMPLANT
DURAPREP 26ML APPLICATOR (WOUND CARE) ×8 IMPLANT
ELECT BLADE 4.0 EZ CLEAN MEGAD (MISCELLANEOUS) ×4
ELECT CAUTERY BLADE 6.4 (BLADE) ×4 IMPLANT
ELECT PENCIL ROCKER SW 15FT (MISCELLANEOUS) ×8 IMPLANT
ELECT REM PT RETURN 9FT ADLT (ELECTROSURGICAL) ×4
ELECTRODE BLDE 4.0 EZ CLN MEGD (MISCELLANEOUS) ×3 IMPLANT
ELECTRODE REM PT RTRN 9FT ADLT (ELECTROSURGICAL) ×3 IMPLANT
GAUZE 4X4 16PLY RFD (DISPOSABLE) IMPLANT
GLOVE BIO SURGEON STRL SZ 6.5 (GLOVE) ×24 IMPLANT
GLOVE BIO SURGEON STRL SZ7.5 (GLOVE) IMPLANT
GLOVE BIOGEL PI IND STRL 6.5 (GLOVE) ×12 IMPLANT
GLOVE BIOGEL PI IND STRL 8.5 (GLOVE) ×9 IMPLANT
GLOVE BIOGEL PI INDICATOR 6.5 (GLOVE) ×4
GLOVE BIOGEL PI INDICATOR 8.5 (GLOVE) ×3
GLOVE SS BIOGEL STRL SZ 8.5 (GLOVE) ×9 IMPLANT
GLOVE SUPERSENSE BIOGEL SZ 8.5 (GLOVE) ×3
GOWN STRL REUS W/ TWL LRG LVL3 (GOWN DISPOSABLE) ×3 IMPLANT
GOWN STRL REUS W/TWL 2XL LVL3 (GOWN DISPOSABLE) ×12 IMPLANT
GOWN STRL REUS W/TWL LRG LVL3 (GOWN DISPOSABLE) ×4
GUIDEWIRE NITINOL BEVEL TIP (WIRE) ×16 IMPLANT
KIT BASIN OR (CUSTOM PROCEDURE TRAY) ×4 IMPLANT
KIT HARVESTER BONE 10 DISP (KITS) ×4 IMPLANT
KIT TURNOVER KIT B (KITS) ×4 IMPLANT
MODULE EMG NEEDLE SSEP NVM5 (NEEDLE) ×4 IMPLANT
MODULE NVM5 NEXT GEN EMG (NEEDLE) ×4 IMPLANT
NEEDLE I-PASS III (NEEDLE) ×4 IMPLANT
NEEDLE SPNL 18GX3.5 QUINCKE PK (NEEDLE) ×4 IMPLANT
NS IRRIG 1000ML POUR BTL (IV SOLUTION) ×4 IMPLANT
PACK LAMINECTOMY ORTHO (CUSTOM PROCEDURE TRAY) ×4 IMPLANT
PACK UNIVERSAL I (CUSTOM PROCEDURE TRAY) ×8 IMPLANT
PAD ARMBOARD 7.5X6 YLW CONV (MISCELLANEOUS) ×8 IMPLANT
PROBE BALL TIP NVM5 SNG USE (BALLOONS) ×4 IMPLANT
PUTTY BONE DBX 5CC MIX (Putty) ×8 IMPLANT
ROD RELINE MAS TI 5.5X35MM LRD (Rod) ×4 IMPLANT
ROD RELINE MAS TI LORD 5.5X40 (Rod) ×4 IMPLANT
SCREW 6.5X25 (Screw) ×4 IMPLANT
SCREW LOCK RELINE 5.5 TULIP (Screw) ×16 IMPLANT
SCREW RELINE MAS POLY 6.5X40MM (Screw) ×8 IMPLANT
SCREW RELINE RED 6.5X45MM POLY (Screw) ×8 IMPLANT
SPONGE INTESTINAL PEANUT (DISPOSABLE) ×8 IMPLANT
SPONGE SURGIFOAM ABS GEL 100 (HEMOSTASIS) ×4 IMPLANT
SURGIFLO W/THROMBIN 8M KIT (HEMOSTASIS) ×8 IMPLANT
SUT BONE WAX W31G (SUTURE) ×4 IMPLANT
SUT MNCRL AB 3-0 PS2 27 (SUTURE) ×8 IMPLANT
SUT PDS AB 1 CTX 36 (SUTURE) ×4 IMPLANT
SUT PROLENE 5 0 C 1 24 (SUTURE) IMPLANT
SUT SILK 2 0 TIES 10X30 (SUTURE) ×4 IMPLANT
SUT SILK 3 0 TIES 10X30 (SUTURE) ×4 IMPLANT
SUT VIC AB 1 CT1 27 (SUTURE) ×8
SUT VIC AB 1 CT1 27XBRD ANBCTR (SUTURE) ×6 IMPLANT
SUT VIC AB 2-0 CT1 18 (SUTURE) ×16 IMPLANT
SYR BULB IRRIG 60ML STRL (SYRINGE) ×4 IMPLANT
TOWEL GREEN STERILE (TOWEL DISPOSABLE) ×4 IMPLANT
TOWEL GREEN STERILE FF (TOWEL DISPOSABLE) ×4 IMPLANT
TRAP SPECIMEN MUCUS 40CC (MISCELLANEOUS) IMPLANT
WATER STERILE IRR 1000ML POUR (IV SOLUTION) ×4 IMPLANT

## 2020-09-08 NOTE — Op Note (Signed)
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  Posterior spinal fusion instrumentation L5-S1.  Anterior iliac crest bone graft harvest.  Complications: None  EBL 125 cc  Approach surgeon: Dr. Chestine Spore; Dr. Juanetta Gosling  First Assistant: Glynis Smiles, PA  Implants:  Titan anterior intervertebral cage.  16 x 40 x 27 (XLG).  6.5 x 25 mm locking screw NuVasive MIS posterior pedicle screw fixation.  L5: 6.5 x 45 mm length screw.  S1: 6.5 x 40 mm length screw.  Left: 40 mm length rod; right: 35 mm length rod.  Autograft: Anterior iliac crest graft through separate incision.  Supplemented with DBX mix.  Neuro monitoring: No adverse free running EMG activity or SSEP activity.  All 4 pedicle screws were directly stimulated and there was no adverse activity at greater than 40 mA.  Indications: Theodore [name] is a very pleasant 54 year old gentleman who had a previous lumbar decompression L5-S1 and has persistent debilitating back buttock and intermittent neuropathic leg pain.  Attempts at conservative management had failed to alleviate his symptoms.  Because of the ongoing loss in quality of life we elected to move forward with surgery.  All appropriate risks benefits and alternatives surgery were discussed with the patient and consent was obtained.  Operative report: Patient is brought the operating room placed upon the operating table.  After successful induction of general anesthesia and endotracheal intubation, teds, SCDs, and a Foley were inserted.  The anterior abdomen was prepped and draped in a standard fashion.  At this point time a timeout was taken to confirm patient procedure and all other important data.  I palpated the iliac crest and marked my incision.  This was infiltrated with  quarter percent Marcaine with epinephrine small incision was made and sharp dissection was carried out down to the iliac crest.  Hand-held retractors were placed to expose the iliac crest and then using the QuickDraw iliac crest harvesting device I harvested iliac crest.  Once this was harvested I irrigated copiously with normal saline, and then used FloSeal to obtain stasis.  The deep fascia was then closed with interrupted #1 Vicryl sutures, then a layer of 2-0 Vicryl sutures, and then a 3-0 Monocryl.  Quarter percent Marcaine with Exparel was injected into the muscle in order to provide adequate postoperative analgesia. Having completed the iliac crest bone graft harvest, Dr. Chestine Spore and his assistant scrubbed in to perform a standard anterior retroperitoneal approach to the lumbar spine.  Please refer to his dictation for specifics on the approach.  Once the retractor system was placed in the L5-S1 disc space was exposed,  Dr. Chestine Spore and his assistant scrubbed out and then I scrubbed back in with my PA to complete the procedure.  A needle was placed into the L5-S1 and lateral fluoroscopy view confirmed I was at the appropriate level.  An annulotomy was  performed with a 10 blade scalpel and I used a combination of pituitary rongeurs curettes and Kerrison rongeurs to remove all of the disc material.  I continued posteriorly until I was able to pass my small curved curette behind the vertebral body of L5 and completely released the annulus I also then passed a small curette behind the body of S1 so that had adequate posterior decompression.  Using my curettes and Kerrison rongeurs I made sure to remove all of the cartilaginous subchondral bone.  A 2 and 3 mm Kerrison rongeur was used to remove the remaining posterior annulus and trimmed down the osteophyte posterior aspect of the L5 and S1 vertebral bodies.  At this point with the discectomy completed I then trialed intervertebral spacers.  The size 16 provided the  best overall and restoration of lumbar lordosis.  The implant was obtained and packed with the allograft bone and supplemented with DBX mix.  I then malleted the cage into the space until it came to rest at the appropriate depth.  A single locking screw was placed into the S1 vertebral body to prevent backout of the cage.  At this point with the retractors were removed and I confirmed hemostasis.  After final irrigation I closed the fascia of the rectus with a running #1 PDS stitch.  Superficial was closed with interrupted 2-0 Vicryl suture and the skin was closed with 3-0 Monocryl.  An intraoperative AP x-ray was done per protocol to confirm there were no retained surgical implants.  Needle and sponge counts were correct.  The drapes were removed and the patient was prepped for rotation for the posterior instrumentation  The Jackson spine frame was secured over the patient and he was rotated into the prone position.  All bony prominences were well-padded and the back was prepped and draped in a standard fashion.  I reviewed the consent again confirming we were going to be doing the L5-S1 posterior pedicle screw fixation.  Using fluoroscopy identified the lateral border of the L5 pedicle and made a small incision and advanced the Jamshidi needle down to the lateral aspect of the pedicle.  I confirmed satisfactory position on the AP view.  I advanced the Jamshidi needle while directly stimulating into the L5 pedicle.  Once I was nearing the medial wall of the pedicle I switched to the lateral view to confirm that I was beyond the posterior wall of the vertebral body.  At this point confirmed trajectory I advanced into the vertebral body and then placed the guidepin through the Jamshidi needle to cannulate the pedicle.  At no point was there any adverse free running EMG activity to suggest breach.  I used the same technique to cannulate the S1 pedicle and the contralateral L5 and S1 pedicles.  Once all 4 pedicles  were cannulated I then measured and placed the 6.5 x 45 mm screw at L5.  This was advanced over the guidepin and into the vertebral body.  At S1 I placed the 6.5 x 40 mm length screw.  Once all 4 screws were placed I then directly stimulated him to confirm there was no adverse activity to suggest breach.  There was no activity greater than 40 mA.  X-rays demonstrated satisfactory overall position of all 4 pedicle screws.  I then measured and placed the rod and secured them with the locking caps.  All 4 locking caps were torqued according manufacture standards.  The insertion tabs were broken off and removed and then I took my final x-rays.  Both the AP and lateral views were satisfactory.  Both wounds were then copiously irrigated with normal saline and hemostasis was obtained using FloSeal and bipolar cautery.  I then closed in a layered fashion with interrupted #1 Vicryl suture, 2-0 Vicryl suture, and 3-0 Monocryl.  Steri-Strips and a dry dressing were applied and the patient was ultimately extubated transfer the PACU without incident.  The end of the case all needle sponge counts were correct.  There were no adverse intraoperative  events.

## 2020-09-08 NOTE — Op Note (Signed)
Date: September 08, 2020  Preoperative diagnosis: Chronic back pain with degenerative disc disease  Postoperative diagnosis: Same  Procedure: Anterior spine exposure at the L5-S1 disc space for ALIF via anterior retroperitoneal approach  Surgeon: Dr. Cephus Shelling, MD  Co-surgeon: Dr. [name], MD  Assistant: Dr. Elna Breslow, MD  Indication: Patient is a 54 year old male with history of degenerative disc disease that was recently seen in consultation by Dr. Shon Baton who has recommended L5-S1 ALIF.  Vascular surgery was asked to assist with anterior spine exposure.  He presents today after risk benefits discussed  Findings: Transverse incision over the left rectus muscle where the L5-S1 disc space was marked.  We opened the anterior rectus sheath and the left rectus muscle circumferentially mobilized and entered the retroperitoneal space lateral to the muscle and the peritoneum and left ureter were mobilized across midline.  Middle sacral vessels were identified and divided between clips.  Left iliac vessels were mobilized.  Ultimately fixed retractors were placed and we confirmed that we were at the correct level with a spinal needle on lateral fluoroscopy.  Anesthesia: General  Details: Patient was taken to the operating room after informed consent was obtained.  Placed on the operative table in supine position.  Ultimately prior to my arrival Dr. Shon Baton marked out the L5-S1 disc space on the anterior abdominal wall over the left rectus muscle.  Once he was ready the patient was already prepped and draped when I entered the OR.  Timeout was performed to confirm we were doing L5-S1 spine exposure from anterior approach.  He had gotten preoperative antibiotics.  Initially made a transverse incision over the left rectus muscle at our preoperative mark with a  scalpel.  Dissected down with Bovie cautery opening the subcutaneous tissue and cerebellum retractors were used for added  visualization.  Anterior rectus sheath was then opened transversely with Bovie cautery.  Hemostats were then used and small flaps were raised under the rectus sheath anteriorly and the left rectus muscle circumferentially mobilized.  I then entered the retroperitoneal space lateral identifying the peritoneum that was then swept medial including the left ureter with blunt dissection.  In the process we identified the left iliac artery and vein as well as the left psoas muscle.  That point in time my assistant Dr. Lenell Antu used hand-held Wiley retractors to pull the peritoneum and left ureter across the midline and I continue to mobilize bluntly with suction and Kd for added visualization of the L5-S1 disc space.  Middle sacral vessels were identified and divided between clips.  I used blunt dissection mobilizing with KD on each side of the disc space including mobilizing the left iliac vein lateral.  Once we had enough mobilization, I placed fixed NuVasive retractor with 140 lip blades on each side of the disc space as well as blades cranial caudal.  Spinal needle was placed and we confirmed on lateral fluoroscopy we were at the L5-S1 level.  Case was turned over to Dr. Shon Baton.  Complication: None  Condition: Stable  Cephus Shelling, MD [facility] Office: [phone]   Cephus Shelling

## 2020-09-08 NOTE — H&P (Signed)
History and Physical Interval Note:  09/08/2020 7:21 AM  Theodore [name]  has presented today for surgery, with the diagnosis of post-laminectomy syndrome.  The various methods of treatment have been discussed with the patient and family. After consideration of risks, benefits and other options for treatment, the patient has consented to  Procedure(s) with comments: Anterior lumbar interbody fusion L5-S1, posterior spinal fusion interbody L5-S1 (N/A) - ; move other case to follow as a surgical intervention.  The patient's history has been reviewed, patient examined, no change in status, stable for surgery.  I have reviewed the patient's chart and labs.  Questions were answered to the patient's satisfaction.    L5-S1 ALIF.  Risks and benefits again discussed.  Cephus Shelling  Patient name: Theodore [name]       MRN: [id number]        DOB: 08/16/1966          Sex: male  REASON FOR CONSULT: Evaluate L5-S1 ALIF  HPI: Theodore [name] is a 54 y.o. male, with history of degenerative disc disease, diabetes, bradycardia status post pacemaker that presents for evaluation of L5-S1 ALIF with Dr. Shon Baton.  Patient states he was doing well until about 5 to 6 months ago when he was swinging a golf club and had sudden lower back pain with radiculopathy down the right leg.  Ultimately has been evaluated by Dr. Shon Baton who has recommended a anterior posterior L5-S1 approach.  He does have a history of lumbar decompression from L4-S1 that was done in 2019 also by Dr. Shon Baton and states after that surgery he got a lot better until his incident on the golf course about 5 to 6 months ago.  He has no prior history of abdominal surgery.  He quit smoking years ago.  He is currently on short-term disability but previously worked in heating and air.      Past Medical History:  Diagnosis Date  . Anxiety   . Arthritis   . DDD (degenerative disc disease), lumbar   . Depression   . Diabetes mellitus     dx 2010  . Lumbar radiculopathy   . Neurogenic claudication due to lumbar spinal stenosis   . Pacemaker    Permanent pacemaker placement by Dr. Molly Maduro McQueen--10/21/2007--a Medtronic Adapta--serial number #[id number]  . Radiculopathy of lumbar region   . Syncope 09/2007   2D Echo--History of frequent episodes of syncope beginning at 54 years of age--usually associated with some type of vagal issue but he does completely lose consciousness         Past Surgical History:  Procedure Laterality Date  . CARDIAC CATHETERIZATION Right 10/20/2007  . HAND SURGERY    . LUMBAR LAMINECTOMY/DECOMPRESSION MICRODISCECTOMY N/A 06/05/2018   Procedure: Lumbar decompression L4-S1;  Surgeon: [name], MD;  Location: [facility] OR;  Service: Orthopedics;  Laterality: N/A;  3 hrs  . PACEMAKER INSERTION  10/21/2007   Dr Jenne Campus  . SHOULDER SURGERY  1999   arthroscopy  . TONSILLECTOMY           Family History  Problem Relation Age of Onset  . Diabetes Sister   . Diabetes Mother        type 2  . Coronary artery disease Mother 47's  . Hypertension Mother   . Stroke Mother     SOCIAL HISTORY: Social History        Socioeconomic History  . Marital status: Married    Spouse name: [name]  . Number of children: Not on  file  . Years of education: Not on file  . Highest education level: Not on file  Occupational History  . Occupation: assembly  Tobacco Use  . Smoking status: Former Smoker    Packs/day: 1.00    Years: 30.00    Pack years: 30.00    Quit date: 10/17/2007    Years since quitting: 12.8  . Smokeless tobacco: Former Clinical biochemist  . Vaping Use: Never used  Substance and Sexual Activity  . Alcohol use: No  . Drug use: No  . Sexual activity: Not on file  Other Topics Concern  . Not on file  Social History Narrative   Lives with wife   Social Determinants of Health      Financial Resource Strain:   . Difficulty of Paying Living  Expenses: Not on file  Food Insecurity:   . Worried About Programme researcher, broadcasting/film/video in the Last Year: Not on file  . Ran Out of Food in the Last Year: Not on file  Transportation Needs:   . Lack of Transportation (Medical): Not on file  . Lack of Transportation (Non-Medical): Not on file  Physical Activity:   . Days of Exercise per Week: Not on file  . Minutes of Exercise per Session: Not on file  Stress:   . Feeling of Stress : Not on file  Social Connections:   . Frequency of Communication with Friends and Family: Not on file  . Frequency of Social Gatherings with Friends and Family: Not on file  . Attends Religious Services: Not on file  . Active Member of Clubs or Organizations: Not on file  . Attends Banker Meetings: Not on file  . Marital Status: Not on file  Intimate Partner Violence:   . Fear of Current or Ex-Partner: Not on file  . Emotionally Abused: Not on file  . Physically Abused: Not on file  . Sexually Abused: Not on file         Allergies  Allergen Reactions  . Cephalexin Hives  . Vancomycin Hives, Itching and Other (See Comments)    True Allergic Reaction -- Not "Red Man's Syndrome Onset of prominent red rash, severe itching/hives Not responsive to Benadryl and IV rate reduction. First seen pre-op for surgery on 06/05/18.          Current Outpatient Medications  Medication Sig Dispense Refill  . ACCU-CHEK AVIVA PLUS test strip Check blood sugars twice a day. Dx: E11.59, Z79.4 100 each 2  . citalopram (CELEXA) 20 MG tablet Take 1 tablet (20 mg total) by mouth daily. 90 tablet 0  . insulin aspart (NOVOLOG) 100 UNIT/ML injection Inject 2 Units into the skin 3 (three) times daily with meals. 10 mL 11  . Insulin Glargine (BASAGLAR KWIKPEN) 100 UNIT/ML INJECT 30 UNITS TOTAL INTO THE SKIN DAILY. (Patient taking differently: Inject 30 Units into the skin daily. ) 45 pen 1  . Insulin Pen Needle (B-D UF III MINI PEN NEEDLES) 31G X 5 MM MISC BD  Ultra-Fine Short Pen Needle 31 gauge x 5/16"  Use with Basaglar Kwikpen 200 each 1  . lisinopril (ZESTRIL) 5 MG tablet Take 1 tablet (5 mg total) by mouth daily. 90 tablet 1  . metFORMIN (GLUCOPHAGE) 1000 MG tablet Take 1 tablet (1,000 mg total) by mouth 2 (two) times daily with a meal. 180 tablet 1   No current facility-administered medications for this visit.    REVIEW OF SYSTEMS:  [X]  denotes positive finding, [ ]   denotes negative finding Cardiac  Comments:  Chest pain or chest pressure:    Shortness of breath upon exertion:    Short of breath when lying flat:    Irregular heart rhythm:        Vascular    Pain in calf, thigh, or hip brought on by ambulation:    Pain in feet at night that wakes you up from your sleep:     Blood clot in your veins:    Leg swelling:         Pulmonary    Oxygen at home:    Productive cough:     Wheezing:         Neurologic    Sudden weakness in arms or legs:     Sudden numbness in arms or legs:     Sudden onset of difficulty speaking or slurred speech:    Temporary loss of vision in one eye:     Problems with dizziness:         Gastrointestinal    Blood in stool:     Vomited blood:         Genitourinary    Burning when urinating:     Blood in urine:        Psychiatric    Major depression:         Hematologic    Bleeding problems:    Problems with blood clotting too easily:        Skin    Rashes or ulcers:        Constitutional    Fever or chills:      PHYSICAL EXAM:    Vitals:   08/23/20 0814  BP: 135/84  Pulse: 90  Resp: 18  Temp: 98 F (36.7 C)  TempSrc: Temporal  SpO2: 96%  Weight: 171 lb (77.6 kg)  Height: 5\' 8"  (1.727 m)    GENERAL: The patient is a well-nourished male, in no acute distress. The vital signs are documented above. CARDIAC: There is a regular rate and rhythm.  VASCULAR:  Palpable radial  pulses bilaterally Palpable femoral pulses bilaterally Palpable dorsalis pedis and posterior tibial pulses bilaterally PULMONARY: No respiratory distress ABDOMEN: Soft and non-tender.  No abdominal incisions MUSCULOSKELETAL: There are no major deformities or cyanosis. NEUROLOGIC: No focal weakness or paresthesias are detected. SKIN: There are no ulcers or rashes noted. PSYCHIATRIC: The patient has a normal affect.  DATA:   I independently reviewed his CT L-spine from 04/08/2020 and aortic bifurcation is at the bottom of L4 with minimal atherosclerosis.  The iliac vein bifurcation is at the L4-L5 disc space.  Assessment/Plan:  54 year old male with degenerative disc disease and chronic lower back pain with radiculopathy that presents for preoperative evaluation of L5-S1 ALIF with Dr. 57.  Ultimately I reviewed his CT lumbar spine from July 2021 and I think he would be an excellent candidate for anterior approach.  We discussed transverse incision over the left rectus muscle with mobilization of the left rectus and entering the retroperitoneal space to mobilize the peritoneum and intestines as well as the left ureter across midline.  We discussed mobilizing iliac artery and veins.  Discussed possible injury to the above structures.  Look forward to assisting Dr. August 2021.   Shon Baton, MD [facility] [place] Office: [phone]

## 2020-09-08 NOTE — Anesthesia Postprocedure Evaluation (Signed)
Anesthesia Post Note  Patient: Theodore [name]  Procedure(s) Performed: Anterior lumbar interbody fusion L5-S1, posterior spinal fusion interbody L5-S1 (N/A ) HARVEST ILIAC BONE GRAFT (Hip) ABDOMINAL EXPOSURE (Abdomen)     Patient location during evaluation: PACU Anesthesia Type: General Level of consciousness: awake Pain management: pain level controlled Vital Signs Assessment: post-procedure vital signs reviewed and stable Respiratory status: spontaneous breathing Cardiovascular status: stable Postop Assessment: no apparent nausea or vomiting Anesthetic complications: no   No complications documented.  Last Vitals:  Vitals:   09/08/20 1600 09/08/20 1641  BP: 138/84 125/79  Pulse: 79 80  Resp: 12 18  Temp: 36.9 C 36.6 C  SpO2: 94% 97%    Last Pain:  Vitals:   09/08/20 1641  TempSrc: Oral  PainSc:                  Dawan Farney

## 2020-09-08 NOTE — H&P (Signed)
Addendum H&P  Theodore [name] presents today for definitive fixation of his severe low back pain and neuropathic leg pain.  I have gone over his clinical history there is been no change in his clinical exam since his last office note.  Patient continues to have severe debilitating back and buttock pain and neuropathic lower extremity pain.  Surgical plan: Anterior lumbar interbody fusion with posterior supplemental fixation.  Because of the underlying diabetes I am going to move forward with an iliac crest bone graft harvest as well.  I have added this to the consent form.  Reviewed the risks and benefits of the fusion surgery as well as iliac crest bone graft harvest with the patient  All of his questions were addressed and answered.  The patient has expressed a willingness and desire to move forward with surgery.

## 2020-09-08 NOTE — Transfer of Care (Signed)
Immediate Anesthesia Transfer of Care Note  Patient: Theodore [name]  Procedure(s) Performed: Anterior lumbar interbody fusion L5-S1, posterior spinal fusion interbody L5-S1 (N/A ) HARVEST ILIAC BONE GRAFT (Hip) ABDOMINAL EXPOSURE (Abdomen)  Patient Location: PACU  Anesthesia Type:General  Level of Consciousness: sedated, patient cooperative and responds to stimulation  Airway & Oxygen Therapy: Patient Spontanous Breathing and Patient connected to nasal cannula oxygen  Post-op Assessment: Report given to RN, Post -op Vital signs reviewed and stable and Patient moving all extremities X 4  Post vital signs: Reviewed and stable  Last Vitals:  Vitals Value Taken Time  BP 120/74 09/08/20 1254  Temp    Pulse 99 09/08/20 1257  Resp 17 09/08/20 1257  SpO2 96 % 09/08/20 1257  Vitals shown include unvalidated device data.  Last Pain:  Vitals:   09/08/20 0633  TempSrc:   PainSc: 0-No pain         Complications: No complications documented.

## 2020-09-08 NOTE — Brief Op Note (Signed)
09/08/2020  12:40 PM  PATIENT:  Theodore [name]  54 y.o. male  PRE-OPERATIVE DIAGNOSIS:  post-laminectomy syndrome  POST-OPERATIVE DIAGNOSIS:  post-laminectomy syndrome  PROCEDURE:  Procedure(s) with comments: Anterior lumbar interbody fusion L5-S1, posterior spinal fusion interbody L5-S1 (N/A) - ; move other case to follow HARVEST ILIAC BONE GRAFT ABDOMINAL EXPOSURE  SURGEON:  Surgeon(s) and Role:    [name], MD - Primary    * Cephus Shelling, MD - Assisting  PHYSICIAN ASSISTANT:   Amadna Ward, PA   ANESTHESIA:   general  EBL:  125 mL   BLOOD ADMINISTERED:none  DRAINS: none   LOCAL MEDICATIONS USED:  MARCAINE    and OTHER exparel  SPECIMEN:  No Specimen  DISPOSITION OF SPECIMEN:  N/A  COUNTS:  YES  TOURNIQUET:  * No tourniquets in log *  DICTATION: .Dragon Dictation  PLAN OF CARE: Admit to inpatient   PATIENT DISPOSITION:  PACU - hemodynamically stable.

## 2020-09-08 NOTE — Anesthesia Procedure Notes (Signed)
Procedure Name: Intubation Date/Time: 09/08/2020 7:52 AM Performed by: Claris Che, CRNA Pre-anesthesia Checklist: Patient identified, Emergency Drugs available, Suction available, Patient being monitored and Timeout performed Patient Re-evaluated:Patient Re-evaluated prior to induction Oxygen Delivery Method: Circle system utilized Preoxygenation: Pre-oxygenation with 100% oxygen Induction Type: IV induction and Cricoid Pressure applied Ventilation: Mask ventilation without difficulty Laryngoscope Size: Mac and 4 Grade View: Grade III Tube type: Oral Tube size: 8.0 mm Number of attempts: 1 Airway Equipment and Method: Stylet Placement Confirmation: ETT inserted through vocal cords under direct vision,  positive ETCO2 and breath sounds checked- equal and bilateral Secured at: 24 cm Tube secured with: Tape Dental Injury: Teeth and Oropharynx as per pre-operative assessment

## 2020-09-08 NOTE — Anesthesia Postprocedure Evaluation (Signed)
Anesthesia Post Note  Patient: Theodore [name]  Procedure(s) Performed: Anterior lumbar interbody fusion L5-S1, posterior spinal fusion interbody L5-S1 (N/A ) HARVEST ILIAC BONE GRAFT (Hip) ABDOMINAL EXPOSURE (Abdomen)     Anesthesia Post Evaluation No complications documented.  Last Vitals:  Vitals:   09/08/20 1600 09/08/20 1641  BP: 138/84 125/79  Pulse: 79 80  Resp: 12 18  Temp: 36.9 C 36.6 C  SpO2: 94% 97%    Last Pain:  Vitals:   09/08/20 1641  TempSrc: Oral  PainSc:                  [name]
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
## 2020-09-08 NOTE — Discharge Instructions (Signed)

## 2020-09-09 ENCOUNTER — Inpatient Hospital Stay (HOSPITAL_COMMUNITY): Payer: BC Managed Care – PPO

## 2020-09-09 DIAGNOSIS — Z981 Arthrodesis status: Secondary | ICD-10-CM

## 2020-09-09 LAB — GLUCOSE, CAPILLARY
Glucose-Capillary: 273 mg/dL — ABNORMAL HIGH (ref 70–99)
Glucose-Capillary: 251 mg/dL — ABNORMAL HIGH (ref 70–99)
Glucose-Capillary: 169 mg/dL — ABNORMAL HIGH (ref 70–99)
Glucose-Capillary: 94 mg/dL (ref 70–99)

## 2020-09-09 MED ORDER — MAGNESIUM CITRATE PO SOLN
1.0000 | Freq: Once | ORAL | Status: AC
  Administered 2020-09-09: 1 via ORAL
  Filled 2020-09-09: qty 296

## 2020-09-09 MED ORDER — HYDROXYZINE HCL 50 MG/ML IM SOLN
50.0000 mg | Freq: Four times a day (QID) | INTRAMUSCULAR | Status: DC | PRN
  Administered 2020-09-09: 50 mg via INTRAMUSCULAR
  Filled 2020-09-09 (×2): qty 1

## 2020-09-09 MED ORDER — FLEET ENEMA 7-19 GM/118ML RE ENEM: 1.0000 | ENEMA | Freq: Every day | RECTAL | Status: DC | PRN

## 2020-09-09 MED FILL — Sodium Chloride IV Soln 0.9%: INTRAVENOUS | Qty: 1000 | Status: AC

## 2020-09-09 MED FILL — Thrombin (Recombinant) For Soln 20000 Unit: CUTANEOUS | Qty: 1 | Status: AC

## 2020-09-09 MED FILL — Heparin Sodium (Porcine) Inj 1000 Unit/ML: INTRAMUSCULAR | Qty: 30 | Status: AC

## 2020-09-09 NOTE — Progress Notes (Signed)
Subjective: 1 Day Post-Op Procedure(s) (LRB): Anterior lumbar interbody fusion L5-S1, posterior spinal fusion interbody L5-S1 (N/A) HARVEST ILIAC BONE GRAFT ABDOMINAL EXPOSURE Patient reports pain as moderate.   Improvement of pre-operative leg pain. Complaining of incisional site pain.  Reports mild nausea, no vomiting. Tolerating PO. +flatus, -BM + void, states he feels he is able to empty bladder completely without issue.  Patient states that due to his pain and nausea he has not yet walked down the hall.  Denies calf pain, CP, SOB   Objective: Vital signs in last 24 hours: Temp:  [97.9 F (36.6 C)-98.6 F (37 C)] 98.4 F (36.9 C) (12/17 0520) Pulse Rate:  [72-107] 91 (12/17 0520) Resp:  [12-18] 18 (12/17 0520) BP: (91-144)/(63-91) 91/64 (12/17 0520) SpO2:  [93 %-99 %] 95 % (12/17 0520)  Intake/Output from previous day: 12/16 0701 - 12/17 0700 In: 2450 [I.V.:2000; IV Piggyback:450] Out: 725 [Urine:600; Blood:125] Intake/Output this shift: No intake/output data recorded.  Recent Labs    09/08/20 1706  HGB 13.4   Recent Labs    09/08/20 1706  WBC 12.4*  RBC 4.06*  HCT 37.3*  PLT 142*   Recent Labs    09/08/20 1706  CREATININE 1.04   No results for input(s): LABPT, INR in the last 72 hours.  Neurologically intact ABD soft Neurovascular intact Sensation intact distally Intact pulses distally Dorsiflexion/Plantar flexion intact Incision: dressing C/D/I and scant drainage No cellulitis present Compartment soft   Assessment/Plan: 1 Day Post-Op Procedure(s) (LRB): Anterior lumbar interbody fusion L5-S1, posterior spinal fusion interbody L5-S1 (N/A) HARVEST ILIAC BONE GRAFT ABDOMINAL EXPOSURE Advance diet Up with therapy Plan for discharge tomorrow Encourage IS DVT PPx: ambulation, Teds, SCDs, will start lovenox. Will get LE doppler to r/o DVT per protocol    Theodore [name] 09/09/2020, 7:03 AM

## 2020-09-09 NOTE — Progress Notes (Signed)
Bilateral lower extremity venous study completed.      Please see CV Proc for preliminary results.   [employer], RVT  

## 2020-09-09 NOTE — Progress Notes (Signed)
Occupational Therapy Evaluation Patient Details Name: Theodore [name] MRN: [id number] DOB: 1966-08-31 Today's Date: 09/09/2020    History of Present Illness 54 yo s/p Anterior lumbar interbody fusion L5-S1, posterior spinal fusion interbody L5-S1. PMH:Hx of back surgery; DM;HTN;pacemaker; deprression with anxiety.   Clinical Impression   PTA pt lives independently with wife and daughter.Per pt he has spent most of his time in bed over the last 6 months due to back pain. Pt currently requires min A with mobility and Mod A with LB ADL. Will follow acutely to educate on compensatory strategies and use of AE/DME to maximize functional level of independence while adhering to back precautions.     Follow Up Recommendations  No OT follow up;Supervision - Intermittent    Equipment Recommendations   (RW)    Recommendations for Other Services PT consult     Precautions / Restrictions Precautions Precautions: Back Precaution Booklet Issued: Yes (comment) Required Braces or Orthoses: Spinal Brace Spinal Brace: Lumbar corset Restrictions Weight Bearing Restrictions: No      Mobility Bed Mobility Overal bed mobility: Needs Assistance Bed Mobility: Sidelying to Sit;Sit to Sidelying   Sidelying to sit: Min assist     Sit to sidelying: Min assist General bed mobility comments: to lift legs; does best getting in/out on R side due to abdominal incision    Transfers Overall transfer level: Needs assistance Equipment used: Rolling walker (2 wheeled) Transfers: Sit to/from Stand Sit to Stand: Min guard              Balance Overall balance assessment: Needs assistance   Sitting balance-Leahy Scale: Good       Standing balance-Leahy Scale: Fair                             ADL either performed or assessed with clinical judgement   ADL Overall ADL's : Needs assistance/impaired     Grooming: Set up;Supervision/safety;Standing   Upper Body Bathing: Set  up;Sitting;Supervision/ safety   Lower Body Bathing: Moderate assistance;Sit to/from stand   Upper Body Dressing : Supervision/safety;Set up;Sitting   Lower Body Dressing: Moderate assistance;Sit to/from stand   Toilet Transfer: Min guard;Ambulation;RW   Toileting- Clothing Manipulation and Hygiene: Moderate assistance       Functional mobility during ADLs: Min guard;Rolling walker;Cueing for safety General ADL Comments: unable to complete figure 4 positioning; may benefit from AE; began educating on compensatory technqieus and use of AE to increase independence with LB ADL and pericare after toileting     Vision         Perception     Praxis      Pertinent Vitals/Pain Pain Assessment: 0-10 Pain Score: 10-Worst pain ever Pain Location: abdomen Pain Descriptors / Indicators: Discomfort;Guarding;Moaning;Aching Pain Intervention(s): Limited activity within patient's tolerance;Repositioned     Hand Dominance Right   Extremity/Trunk Assessment Upper Extremity Assessment Upper Extremity Assessment: RUE deficits/detail;LUE deficits/detail RUE Deficits / Details: genealized weakness; minimally limited IR LUE Deficits / Details: plans to have shoulder surgery soon due to limited ROM and "bone spurs"; able to IR to hip LUE Coordination: decreased gross motor   Lower Extremity Assessment Lower Extremity Assessment: Defer to PT evaluation   Cervical / Trunk Assessment Cervical / Trunk Assessment: Other exceptions (hx of back surgery)   Communication Communication Communication: No difficulties   Cognition Arousal/Alertness: Awake/alert Behavior During Therapy: Anxious Overall Cognitive Status: Within Functional Limits for tasks assessed  General Comments       Exercises     Shoulder Instructions      Home Living Family/patient expects to be discharged to:: Private residence Living Arrangements: Spouse/significant  other Available Help at Discharge: Family;Available 24 hours/day Type of Home: House Home Access: Level entry     Home Layout: Two level;Bed/bath upstairs;1/2 bath on main level     Bathroom Shower/Tub: Tub/shower unit;Curtain   Bathroom Toilet: Handicapped height (both) Bathroom Accessibility: Yes How Accessible: Accessible via walker Home Equipment: Shower seat          Prior Functioning/Environment Level of Independence: Independent        Comments: Has spent the last 6 months in bed per pt        OT Problem List: Decreased strength;Decreased range of motion;Decreased activity tolerance;Impaired balance (sitting and/or standing);Decreased safety awareness;Decreased knowledge of use of DME or AE;Decreased knowledge of precautions;Pain;Impaired UE functional use      OT Treatment/Interventions: Self-care/ADL training;Therapeutic exercise;DME and/or AE instruction;Therapeutic activities;Patient/family education;Balance training    OT Goals(Current goals can be found in the care plan section) Acute Rehab OT Goals Patient Stated Goal: to not be in pain and be able to do more OT Goal Formulation: With patient Time For Goal Achievement: 09/23/20 Potential to Achieve Goals: Good  OT Frequency: Min 3X/week   Barriers to D/C:            Co-evaluation              AM-PAC OT "6 Clicks" Daily Activity     Outcome Measure Help from another person eating meals?: None Help from another person taking care of personal grooming?: A Little Help from another person toileting, which includes using toliet, bedpan, or urinal?: A Lot Help from another person bathing (including washing, rinsing, drying)?: A Lot Help from another person to put on and taking off regular upper body clothing?: A Little Help from another person to put on and taking off regular lower body clothing?: A Lot 6 Click Score: 16   End of Session Equipment Utilized During Treatment: Rolling walker;Back  brace Nurse Communication: Mobility status  Activity Tolerance: Patient tolerated treatment well Patient left: in chair;in bed;with call bell/phone within reach (seen again to help back into bed for procedure)  OT Visit Diagnosis: Unsteadiness on feet (R26.81);Other abnormalities of gait and mobility (R26.89);Muscle weakness (generalized) (M62.81);Pain Pain - part of body:  (abdomen)                Time: 2130-8657 OT Time Calculation (min): 32 min Charges:  OT General Charges $OT Visit: 1 Visit OT Evaluation $OT Eval Moderate Complexity: 1 Mod OT Treatments $Self Care/Home Management : 8-22 mins  Luisa Dago, OT/L   Acute OT Clinical Specialist Acute Rehabilitation Services Pager [phone] Office [phone]   [facility] 09/09/2020, 10:13 AM

## 2020-09-09 NOTE — Progress Notes (Signed)
Patient trasferred to [facility] via w/c. Report given to receiving RN . All personal belongings taken with patient.

## 2020-09-09 NOTE — Evaluation (Signed)
Physical Therapy Evaluation Patient Details Name: Theodore [name] MRN: [id number] DOB: 07/06/1966 Today's Date: 09/09/2020   History of Present Illness  54 yo s/p Anterior lumbar interbody fusion L5-S1, posterior spinal fusion interbody L5-S1. PMH:Hx of back surgery; DM;HTN;pacemaker; deprression with anxiety.  Clinical Impression  Pt presents to PT with significant pain in abdomen and back along with deficits in strength, power, endurance, gait, and functional mobility. Pt is able to perform bed mobility, transfer, and ambulate without physical assistance at this time. Pt does require assistance to negotiate 1 step due to LE weakness and discomfort. Pt reports he is able to sleep on a couch downstairs if unable to ascend flight of stairs at the time of discharge. Pt will benefit from continued acute PT POC to improve mobility quality and tolerance. PT recommends HHPT and a RW at the time of discharge.    Follow Up Recommendations Home health PT;Supervision - Intermittent (likely for stair training)    Equipment Recommendations  Rolling walker with 5" wheels    Recommendations for Other Services       Precautions / Restrictions Precautions Precautions: Back Precaution Booklet Issued: Yes (comment) Precaution Comments: pt able to recall 3/3 back precautions from prior back surgery Required Braces or Orthoses: Spinal Brace Spinal Brace: Lumbar corset;Applied in sitting position Restrictions Weight Bearing Restrictions: No      Mobility  Bed Mobility Overal bed mobility: Needs Assistance Bed Mobility: Rolling;Sidelying to Sit;Sit to Sidelying Rolling: Supervision Sidelying to sit: Supervision     Sit to sidelying: Supervision General bed mobility comments: use of railing, slight elevation to [name]    Transfers Overall transfer level: Needs assistance Equipment used: Rolling walker (2 wheeled) Transfers: Sit to/from Stand Sit to Stand: Supervision         General  transfer comment: stand to sit without device  Ambulation/Gait Ambulation/Gait assistance: Supervision Gait Distance (Feet): 120 Feet (120' with RW, 52' without device) Assistive device: Rolling walker (2 wheeled);None Gait Pattern/deviations: Step-to pattern Gait velocity: reduced Gait velocity interpretation: <1.8 ft/sec, indicate of risk for recurrent falls General Gait Details: pt with shortened step-to gait pattern, reduced stride length without use of RW  Stairs Stairs: Yes Stairs assistance: Min assist Stair Management: One rail Left;Step to pattern;Forwards (one rail and HHA) Number of Stairs: 1 General stair comments: pt refuses to attempt more stairs due to discomfort  Wheelchair Mobility    Modified Rankin (Stroke Patients Only)       Balance Overall balance assessment: Needs assistance Sitting-balance support: No upper extremity supported;Feet supported Sitting balance-Leahy Scale: Good     Standing balance support: No upper extremity supported Standing balance-Leahy Scale: Fair                               Pertinent Vitals/Pain Pain Assessment: 0-10 Pain Score: 10-Worst pain ever Pain Location: abdomen Pain Descriptors / Indicators: Discomfort Pain Intervention(s): Premedicated before session    Home Living Family/patient expects to be discharged to:: Private residence Living Arrangements: Spouse/significant other Available Help at Discharge: Family;Available 24 hours/day Type of Home: House Home Access: Level entry     Home Layout: Two level;Bed/bath upstairs;1/2 bath on main level (bed on first floor but occupied by [name]) Home Equipment: Shower seat;Toilet riser      Prior Function Level of Independence: Independent         Comments: pt reports mobilizing independently until prior to this surgery, conflicting reports to PT and  OT     Hand Dominance   Dominant Hand: Right    Extremity/Trunk Assessment   Upper Extremity  Assessment Upper Extremity Assessment: Defer to OT evaluation RUE Deficits / Details: genealized weakness; minimally limited IR LUE Deficits / Details: plans to have shoulder surgery soon due to limited ROM and "bone spurs"; able to IR to hip LUE Coordination: decreased gross motor    Lower Extremity Assessment Lower Extremity Assessment: Generalized weakness (4/5 quad strength bilaterally)    Cervical / Trunk Assessment Cervical / Trunk Assessment: Other exceptions (history of back surgery)  Communication   Communication: No difficulties  Cognition Arousal/Alertness: Awake/alert Behavior During Therapy: Anxious (about pain related to stair negotiaiton) Overall Cognitive Status: Within Functional Limits for tasks assessed                                        General Comments General comments (skin integrity, edema, etc.): VSS on RA    Exercises     Assessment/Plan    PT Assessment Patient needs continued PT services  PT Problem List Decreased strength;Decreased activity tolerance;Decreased balance;Decreased mobility;Decreased knowledge of use of DME;Decreased safety awareness;Decreased knowledge of precautions;Pain       PT Treatment Interventions DME instruction;Gait training;Stair training;Functional mobility training;Therapeutic activities;Therapeutic exercise;Balance training;Neuromuscular re-education;Patient/family education    PT Goals (Current goals can be found in the Care Plan section)  Acute Rehab PT Goals Patient Stated Goal: To return to independence PT Goal Formulation: With patient Time For Goal Achievement: 09/23/20 Potential to Achieve Goals: Good    Frequency Min 5X/week   Barriers to discharge        Co-evaluation               AM-PAC PT "6 Clicks" Mobility  Outcome Measure Help needed turning from your back to your side while in a flat bed without using bedrails?: A Little Help needed moving from lying on your back to  sitting on the side of a flat bed without using bedrails?: A Little Help needed moving to and from a bed to a chair (including a wheelchair)?: A Little Help needed standing up from a chair using your arms (e.g., wheelchair or bedside chair)?: A Little Help needed to walk in hospital room?: A Little Help needed climbing 3-5 steps with a railing? : A Lot 6 Click Score: 17    End of Session Equipment Utilized During Treatment: Back brace Activity Tolerance: Patient limited by pain Patient left: in bed;with call bell/phone within reach Nurse Communication: Mobility status PT Visit Diagnosis: Other abnormalities of gait and mobility (R26.89);Muscle weakness (generalized) (M62.81);Pain Pain - part of body:  (back and abdomen)    Time: 6226-3335 PT Time Calculation (min) (ACUTE ONLY): 20 min   Charges:   PT Evaluation $PT Eval Low Complexity: 1 Low          Arlyss Gandy, PT, DPT Acute Rehabilitation Pager: [phone]   Arlyss Gandy 09/09/2020, 10:40 AM

## 2020-09-09 NOTE — Progress Notes (Signed)
[facility]  Subjective  - states a bit sore at abdominal incision.  Passing gas.   Objective 91/64 91 98.4 F (36.9 C) (Oral) 18 95%  Intake/Output Summary (Last 24 hours) at 09/09/2020 0748 Last data filed at 09/08/2020 1227 Gross per 24 hour  Intake 2450 ml  Output 725 ml  Net 1725 ml    Left PT palpable Left lower abdominal incision c/d/i Appropriate post-op incisional tenderness  Laboratory Lab Results: Recent Labs    09/08/20 1706  WBC 12.4*  HGB 13.4  HCT 37.3*  PLT 142*   BMET Recent Labs    09/08/20 1706  CREATININE 1.04    COAG Lab Results  Component Value Date   INR 1.1 09/05/2020   INR 1.1 10/21/2007   INR 1.1 10/18/2007   No results found for: PTT  Assessment/Planning:  Postop day 1 status post L5-S1 ALIF.  Overall looks good this morning with appropriate postop incisional tenderness over his left lower abdominal incision.  He is passing gas.  He has ambulated to the bathroom.  He has a palpable PT pulse in the left foot.  Vascular will sign off.  Please call with questions or concerns  Theodore [name] 09/09/2020 7:48 AM --

## 2020-09-10 LAB — GLUCOSE, CAPILLARY
Glucose-Capillary: 161 mg/dL — ABNORMAL HIGH (ref 70–99)
Glucose-Capillary: 180 mg/dL — ABNORMAL HIGH (ref 70–99)

## 2020-09-10 NOTE — Progress Notes (Signed)
Physical Therapy Treatment Patient Details Name: Theodore [name] MRN: [id number] DOB: [birth year] Today's Date: 09/10/2020    History of Present Illness 54 yo s/p Anterior lumbar interbody fusion L5-S1, posterior spinal fusion interbody L5-S1. PMH:Hx of back surgery; DM;HTN;pacemaker; deprression with anxiety.    PT Comments    Patient progressing towards physical therapy goals. Patient declined stair negotiation this session due to pain in abdomen and groin. Patient overall supervision with mobility with RW. Simulated getting in/out of bed with bed height at home, supervision required for safety. Patient able to recall 3/3 precautions and maintain during mobility. Patient continues to be limited by pain, decreased activity tolerance, and generalized weakness. Continue to recommend HHPT following discharge to maximize functional mobility in the home.    Follow Up Recommendations  Home health PT;Supervision - Intermittent     Equipment Recommendations  Rolling Twan Harkin with 5" wheels    Recommendations for Other Services       Precautions / Restrictions Precautions Precautions: Back Precaution Booklet Issued: Yes (comment) Precaution Comments: pt able to recall 3/3 back precautions from prior back surgery Required Braces or Orthoses: Spinal Brace Spinal Brace: Lumbar corset;Applied in sitting position Restrictions Weight Bearing Restrictions: No    Mobility  Bed Mobility Overal bed mobility: Needs Assistance Bed Mobility: Rolling;Sidelying to Sit;Sit to Sidelying Rolling: Supervision Sidelying to sit: Supervision     Sit to sidelying: Supervision General bed mobility comments: HOB flat, minimal use of railing. Simulated bed height as at home  Transfers Overall transfer level: Needs assistance Equipment used: Rolling Shamir Sedlar (2 wheeled) Transfers: Sit to/from Stand Sit to Stand: Supervision            Ambulation/Gait Ambulation/Gait assistance: Supervision Gait  Distance (Feet): 300 Feet Assistive device: Rolling Edyth Glomb (2 wheeled) Gait Pattern/deviations: Step-through pattern Gait velocity: decreased       Stairs         General stair comments: declined stair negotiation due to pain in groin and abdomen   Wheelchair Mobility    Modified Rankin (Stroke Patients Only)       Balance Overall balance assessment: Needs assistance Sitting-balance support: No upper extremity supported;Feet supported Sitting balance-Leahy Scale: Good     Standing balance support: No upper extremity supported Standing balance-Leahy Scale: Fair                              Cognition Arousal/Alertness: Awake/alert Behavior During Therapy: WFL for tasks assessed/performed Overall Cognitive Status: Within Functional Limits for tasks assessed                                        Exercises      General Comments        Pertinent Vitals/Pain Pain Assessment: Faces Faces Pain Scale: Hurts even more Pain Location: abdomen Pain Descriptors / Indicators: Discomfort Pain Intervention(s): Monitored during session    Home Living                      Prior Function            PT Goals (current goals can now be found in the care plan section) Acute Rehab PT Goals Patient Stated Goal: To return to independence PT Goal Formulation: With patient Time For Goal Achievement: 09/23/20 Potential to Achieve Goals: Good Progress towards PT goals: Progressing toward goals  Frequency    Min 5X/week      PT Plan Current plan remains appropriate    Co-evaluation              AM-PAC PT "6 Clicks" Mobility   Outcome Measure  Help needed turning from your back to your side while in a flat bed without using bedrails?: A Little Help needed moving from lying on your back to sitting on the side of a flat bed without using bedrails?: A Little Help needed moving to and from a bed to a chair (including a  wheelchair)?: A Little Help needed standing up from a chair using your arms (e.g., wheelchair or bedside chair)?: A Little Help needed to walk in hospital room?: A Little Help needed climbing 3-5 steps with a railing? : A Lot 6 Click Score: 17    End of Session Equipment Utilized During Treatment: Back brace Activity Tolerance: Patient limited by pain Patient left: in bed;with call bell/phone within reach;with family/visitor present Nurse Communication: Mobility status PT Visit Diagnosis: Other abnormalities of gait and mobility (R26.89);Muscle weakness (generalized) (M62.81);Pain Pain - part of body:  (back and abdomen)     Time: 9833-8250 PT Time Calculation (min) (ACUTE ONLY): 42 min  Charges:  $Therapeutic Activity: 38-52 mins                     Gregor Hams, PT, DPT Acute Rehabilitation Services Pager [phone] Office [phone]    Zannie Kehr Allred 09/10/2020, 11:32 AM

## 2020-09-10 NOTE — Progress Notes (Signed)
Pt ambulated to the bathroom standby assist with a walker. Pt able to have a large BM.

## 2020-09-10 NOTE — Progress Notes (Signed)
Theodore [name]  MRN: [id number] DOB/Age: 1966-05-27 55 y.o. Physician: Lynnea Maizes, M.D. 2 Days Post-Op Procedure(s) (LRB): Anterior lumbar interbody fusion L5-S1, posterior spinal fusion interbody L5-S1 (N/A) HARVEST ILIAC BONE GRAFT ABDOMINAL EXPOSURE  Subjective: Patient resting in bed this a.m., reporting mild periincisional lower abdominal pain.  Positive bowel movement x2 this a.m.  Good appetite.  Denies nausea Vital Signs Temp:  [97.9 F (36.6 C)-98.9 F (37.2 C)] 98.2 F (36.8 C) (12/18 0758) Pulse Rate:  [80-104] 98 (12/18 0758) Resp:  [14-18] 14 (12/18 0758) BP: (97-106)/(60-74) 106/65 (12/18 0758) SpO2:  [90 %-96 %] 90 % (12/18 0758)  Lab Results Recent Labs    09/08/20 1706  WBC 12.4*  HGB 13.4  HCT 37.3*  PLT 142*   BMET Recent Labs    09/08/20 1706  CREATININE 1.04   INR  Date Value Ref Range Status  09/05/2020 1.1 0.8 - 1.2 Final    Comment:    (NOTE) INR goal varies based on device and disease states. Performed at [facility], [address], [place], [place] [address]      Exam  Abdominal dressings dry.  Grossly neurovascular intact distally both lower extremities.  Plan Discharge home today.  Reviewed mobility precautions.  Prescriptions confirmed to be on chart.  Patient will get Lovenox education from nursing staff.  Follow-up with Dr. Shon Baton per discharge plan. Toua Stites M Dorell Gatlin 09/10/2020, 9:30 AM   Contact # [phone]

## 2020-09-10 NOTE — Progress Notes (Signed)
Discharge instructions reviewed with patient and wife. Prescriptions given. Patient educated on Lovenox administration and is comfortable with it as he takes insulin daily. Rolling walker given to patient. Patient expressed that surgeon told him he would start PT after his first office visit. Patient aware of when to wear brace and how to don and doff. Instructed to call surgeon's office with issues.  Peggye Ley, RN

## 2020-09-10 NOTE — Progress Notes (Signed)
Occupational Therapy Treatment Patient Details Name: LAMARION MCEVERS MRN: [id number] DOB: Jul 11, 1966 Today's Date: 09/10/2020    History of present illness 54 yo s/p Anterior lumbar interbody fusion L5-S1, posterior spinal fusion interbody L5-S1. PMH:Hx of back surgery; DM;HTN;pacemaker; deprression with anxiety.   OT comments  Pt received reclined in bed, with wife at bedside, agreeable to session to address compensatory strategies and safe use of AE prior to dc. S bed mobility, good use of log rolling technique. I with donning back brace, states 3/3 back precautions. Mod A Lb dressing due to inability to demonstrate figure 4 position. Min Guard functional transfers. Pt educated of toilet aide with demonstration. All concerns met and pt ready for dc to home with family.   Follow Up Recommendations  No OT follow up;Supervision - Intermittent    Equipment Recommendations   (RW)    Recommendations for Other Services PT consult    Precautions / Restrictions Precautions Precautions: Back Precaution Booklet Issued: Yes (comment) Precaution Comments: pt able to recall 3/3 back precautions from prior back surgery Required Braces or Orthoses: Spinal Brace Spinal Brace: Lumbar corset;Applied in sitting position Restrictions Weight Bearing Restrictions: No       Mobility Bed Mobility Overal bed mobility: Needs Assistance Bed Mobility: Rolling;Sidelying to Sit;Sit to Sidelying Rolling: Supervision Sidelying to sit: Supervision     Sit to sidelying: Supervision General bed mobility comments: HOB flat, minimal use of railing. Simulated bed height as at home  Transfers Overall transfer level: Needs assistance Equipment used: Rolling walker (2 wheeled) Transfers: Sit to/from Stand Sit to Stand: Supervision         General transfer comment: bed elevated to simulate bed at home and to assist with sit to stand transition.    Balance Overall balance assessment: Needs  assistance Sitting-balance support: No upper extremity supported;Feet supported Sitting balance-Leahy Scale: Good     Standing balance support: No upper extremity supported Standing balance-Leahy Scale: Fair                             ADL either performed or assessed with clinical judgement   ADL Overall ADL's : Needs assistance/impaired     Grooming: Set up;Supervision/safety;Standing;Oral care Grooming Details (indicate cue type and reason): pt education of compensatory strategy at sink level         Upper Body Dressing : Set up;Sitting   Lower Body Dressing: Moderate assistance;Sit to/from stand Lower Body Dressing Details (indicate cue type and reason): VCs for sequencing, assist to initiate donning pants, able to present from knees to hips. Toilet Transfer: Min guard;Ambulation;RW Toilet Transfer Details (indicate cue type and reason): simulated toilet transfer from bed <>bed         Functional mobility during ADLs: Min guard;Rolling walker;Cueing for safety General ADL Comments: unable to complete figure 4 positioning; may benefit form AE; educated on compensatory techniques and use of AE. Wife present and purchased toilet, demonstration and education for management.     Vision       Perception     Praxis      Cognition Arousal/Alertness: Awake/alert Behavior During Therapy: WFL for tasks assessed/performed Overall Cognitive Status: Within Functional Limits for tasks assessed                                          Exercises  Shoulder Instructions       General Comments      Pertinent Vitals/ Pain       Pain Assessment: Faces Faces Pain Scale: Hurts even more Pain Location: abdomen Pain Descriptors / Indicators: Discomfort Pain Intervention(s): Monitored during session;Repositioned  Home Living                                          Prior Functioning/Environment               Frequency  Min 3X/week        Progress Toward Goals  OT Goals(current goals can now be found in the care plan section)  Progress towards OT goals: Progressing toward goals  Acute Rehab OT Goals Patient Stated Goal: To return to independence OT Goal Formulation: With patient Time For Goal Achievement: 09/23/20 Potential to Achieve Goals: Good ADL Goals Pt Will Perform Lower Body Bathing: with modified independence;sit to/from stand;with adaptive equipment Pt Will Perform Lower Body Dressing: with modified independence;sit to/from stand;with adaptive equipment Pt Will Transfer to Toilet: with modified independence;ambulating Pt Will Perform Toileting - Clothing Manipulation and hygiene: with modified independence;sit to/from stand;sitting/lateral leans;with adaptive equipment Additional ADL Goal #1: Pt will independently verbalize 3 back precautions  Plan Discharge plan remains appropriate    Co-evaluation                 AM-PAC OT "6 Clicks" Daily Activity     Outcome Measure   Help from another person eating meals?: None Help from another person taking care of personal grooming?: None Help from another person toileting, which includes using toliet, bedpan, or urinal?: A Lot Help from another person bathing (including washing, rinsing, drying)?: A Lot Help from another person to put on and taking off regular upper body clothing?: A Little Help from another person to put on and taking off regular lower body clothing?: A Lot 6 Click Score: 17    End of Session Equipment Utilized During Treatment: Rolling walker;Back brace  OT Visit Diagnosis: Unsteadiness on feet (R26.81);Other abnormalities of gait and mobility (R26.89);Muscle weakness (generalized) (M62.81);Pain Pain - part of body:  (abdomen)   Activity Tolerance Patient tolerated treatment well   Patient Left in chair;in bed;with call bell/phone within reach (seen again to help back into bed for procedure)    Nurse Communication Mobility status        Time: 7619-5093 OT Time Calculation (min): 31 min  Charges: OT General Charges $OT Visit: 1 Visit OT Treatments $Self Care/Home Management : 23-37 mins  Minus Breeding, MSOT, OTR/L  Supplemental Rehabilitation Services  [phone]    Marius Ditch 09/10/2020, 2:12 PM

## 2020-09-12 ENCOUNTER — Encounter (HOSPITAL_COMMUNITY): Payer: Self-pay | Admitting: Orthopedic Surgery

## 2020-09-12 NOTE — Discharge Summary (Signed)
Patient ID: Theodore [name] MRN: [id number] DOB/AGE: [birth year] 54 y.o.  Admit date: 09/08/2020 Discharge date: 09/12/2020  Admission Diagnoses:  Active Problems:   S/P lumbar fusion   Discharge Diagnoses:  Active Problems:   S/P lumbar fusion  status post Procedure(s): Anterior lumbar interbody fusion L5-S1, posterior spinal fusion interbody L5-S1 HARVEST ILIAC BONE GRAFT ABDOMINAL EXPOSURE  Past Medical History:  Diagnosis Date  . Anxiety   . Arthritis   . Bilateral elbow joint pain   . Chronic back pain 03/18/2018  . DDD (degenerative disc disease), lumbar   . Depression   . Diabetes mellitus    dx 2010 type 2  . Lumbar radiculopathy   . Lumbar spine pain   . Neurogenic claudication due to lumbar spinal stenosis   . Neurogenic claudication due to lumbar spinal stenosis 05/10/2018  . Pacemaker    Permanent pacemaker placement by Dr. Molly Maduro McQueen--10/21/2007--a [employer] Adapta--serial number #[id number]  . Pain in joint of left shoulder   . Post laminectomy syndrome 07/07/2020  . Radiculopathy of lumbar region   . Syncope 09/2007   2D Echo--History of frequent episodes of syncope beginning at 54 years of age--usually associated with some type of vagal issue but he does completely lose consciousness    Surgeries: Procedure(s): Anterior lumbar interbody fusion L5-S1, posterior spinal fusion interbody L5-S1 HARVEST ILIAC BONE GRAFT ABDOMINAL EXPOSURE on 09/08/2020   Consultants:   Discharged Condition: Improved  Hospital Course: Theodore [name] is an 54 y.o. male who was admitted 09/08/2020 for operative treatment of post-laminectomy syndrome. Patient failed conservative treatments (please see the history and physical for the specifics) and had severe unremitting pain that affects sleep, daily activities and work/hobbies. After pre-op clearance, the patient was taken to the operating room on 09/08/2020 and underwent  Procedure(s): Anterior lumbar interbody  fusion L5-S1, posterior spinal fusion interbody L5-S1 HARVEST ILIAC BONE GRAFT ABDOMINAL EXPOSURE.    Patient was given perioperative antibiotics:  Anti-infectives (From admission, onward)   Start     Dose/Rate Route Frequency Ordered Stop   09/08/20 1800  ceFAZolin (ANCEF) IVPB 1 g/50 mL premix        1 g 100 mL/hr over 30 Minutes Intravenous Every 8 hours 09/08/20 1645 09/09/20 1012   09/08/20 0729  ceFAZolin (ANCEF) 2-4 GM/100ML-% IVPB       Note to Pharmacy: Lorre Munroe   : cabinet override      09/08/20 0729 09/08/20 1944       Patient was given sequential compression devices and early ambulation to prevent DVT.   Patient benefited maximally from hospital stay and there were no complications. At the time of discharge, the patient was urinating/moving their bowels without difficulty, tolerating a regular diet, pain is controlled with oral pain medications and they have been cleared by PT/OT.   Recent vital signs: No data found.   Recent laboratory studies: No results for input(s): WBC, HGB, HCT, PLT, NA, K, CL, CO2, BUN, CREATININE, GLUCOSE, INR, CALCIUM in the last 72 hours.  Invalid input(s): PT, 2   Discharge Medications:   Allergies as of 09/10/2020      Reactions   Cephalexin Hives   Vancomycin Hives, Itching, Other (See Comments)   True Allergic Reaction -- Not "Red Man's Syndrome Onset of prominent red rash, severe itching/hives Not responsive to Benadryl and IV rate reduction. First seen pre-op for surgery on 06/05/18.      Medication List    STOP taking these medications   citalopram 20  MG tablet Commonly known as: CELEXA     TAKE these medications   Accu-Chek Aviva Plus test strip Generic drug: glucose blood Check blood sugars twice a day. Dx: E11.59, Z79.4   atorvastatin 10 MG tablet Commonly known as: LIPITOR Take 10 mg by mouth daily.   B-D UF III MINI PEN NEEDLES 31G X 5 MM Misc Generic drug: Insulin Pen Needle BD Ultra-Fine Short Pen Needle  31 gauge x 5/16"  Use with Basaglar Kwikpen   Basaglar KwikPen 100 UNIT/ML INJECT 30 UNITS TOTAL INTO THE SKIN DAILY. What changed: See the new instructions.   enoxaparin 40 MG/0.4ML injection Commonly known as: LOVENOX Inject 0.4 mLs (40 mg total) into the skin daily for 10 days. 10 day supply 1 injection per day   insulin aspart 100 UNIT/ML injection Commonly known as: NovoLOG Inject 2 Units into the skin 3 (three) times daily with meals. What changed: how much to take   lisinopril 5 MG tablet Commonly known as: ZESTRIL Take 1 tablet (5 mg total) by mouth daily.   metFORMIN 1000 MG tablet Commonly known as: GLUCOPHAGE Take 1 tablet (1,000 mg total) by mouth 2 (two) times daily with a meal.   methocarbamol 500 MG tablet Commonly known as: Robaxin Take 1 tablet (500 mg total) by mouth every 8 (eight) hours as needed for up to 5 days for muscle spasms.   ondansetron 4 MG tablet Commonly known as: Zofran Take 1 tablet (4 mg total) by mouth every 8 (eight) hours as needed for nausea or vomiting.   oxyCODONE-acetaminophen 10-325 MG tablet Commonly known as: Percocet Take 1 tablet by mouth every 6 (six) hours as needed for up to 5 days for pain.   sertraline 50 MG tablet Commonly known as: ZOLOFT Take 50 mg by mouth daily.       Diagnostic Studies: DG Chest 2 View  Result Date: 09/05/2020 CLINICAL DATA:  Preoperative assessment for spine surgery EXAM: CHEST - 2 VIEW COMPARISON:  July 21, 2020 FINDINGS: Lungs are clear. Heart size and pulmonary vascularity are normal. Pacemaker leads are attached to the right atrium and right ventricle. No adenopathy. There is degenerative change in the thoracic spine. IMPRESSION: Lungs clear. Heart size normal. Pacemaker leads attached to right atrium and ventricle. Electronically Signed   By: Bretta Bang III M.D.   On: 09/05/2020 14:23   DG Lumbar Spine 2-3 Views  Result Date: 09/08/2020 CLINICAL DATA:  Elective surgery.  Additional history provided: L5-S1 ALIF with pedicle screws. Provided fluoroscopy time 2 minutes, 48 seconds (43.63 mGy). EXAM: LUMBAR SPINE - 2-3 VIEW; DG C-ARM 1-60 MIN COMPARISON:  Lumbar CT myelogram 04/08/2020. FINDINGS: PA and lateral view intraoperative fluoroscopic images of the lumbosacral spine are submitted, 3 images total. The images demonstrate L5-S1 anterior interbody fusion hardware. Additionally, there is and L5-S1 posterior spinal fusion construct utilizing bilateral pedicle screws and vertical interconnecting rods. No unexpected finding. IMPRESSION: Three intraoperative fluoroscopic images of the lumbosacral spine from L5-S1 fusion, as described. Electronically Signed   By: Jackey Loge DO   On: 09/08/2020 12:12   DG C-Arm 1-60 Min  Result Date: 09/08/2020 CLINICAL DATA:  Elective surgery. Additional history provided: L5-S1 ALIF with pedicle screws. Provided fluoroscopy time 2 minutes, 48 seconds (43.63 mGy). EXAM: LUMBAR SPINE - 2-3 VIEW; DG C-ARM 1-60 MIN COMPARISON:  Lumbar CT myelogram 04/08/2020. FINDINGS: PA and lateral view intraoperative fluoroscopic images of the lumbosacral spine are submitted, 3 images total. The images demonstrate L5-S1 anterior interbody fusion hardware.  Additionally, there is and L5-S1 posterior spinal fusion construct utilizing bilateral pedicle screws and vertical interconnecting rods. No unexpected finding. IMPRESSION: Three intraoperative fluoroscopic images of the lumbosacral spine from L5-S1 fusion, as described. Electronically Signed   By: Jackey Loge DO   On: 09/08/2020 12:12   VAS [place] LOWER EXTREMITY VENOUS (DVT)  Result Date: 09/09/2020  Lower Venous DVT Study Other Indications: S/p ALIF. Comparison Study: No previous exam Performing Technologist: Clint Guy  Examination Guidelines: A complete evaluation includes B-mode imaging, spectral Doppler, color Doppler, and power Doppler as needed of all accessible portions of each vessel. Bilateral  testing is considered an integral part of a complete examination. Limited examinations for reoccurring indications may be performed as noted. The reflux portion of the exam is performed with the patient in reverse Trendelenburg.  +---------+---------------+---------+-----------+----------+--------------+ RIGHT    CompressibilityPhasicitySpontaneityPropertiesThrombus Aging +---------+---------------+---------+-----------+----------+--------------+ CFV      Full           Yes      Yes                                 +---------+---------------+---------+-----------+----------+--------------+ SFJ      Full                                                        +---------+---------------+---------+-----------+----------+--------------+ FV Prox  Full                                                        +---------+---------------+---------+-----------+----------+--------------+ FV Mid   Full                                                        +---------+---------------+---------+-----------+----------+--------------+ FV DistalFull                                                        +---------+---------------+---------+-----------+----------+--------------+ PFV      Full                                                        +---------+---------------+---------+-----------+----------+--------------+ POP      Full           Yes      Yes                                 +---------+---------------+---------+-----------+----------+--------------+ PTV      Full                                                        +---------+---------------+---------+-----------+----------+--------------+  PERO     Full                                                        +---------+---------------+---------+-----------+----------+--------------+   +---------+---------------+---------+-----------+----------+--------------+ LEFT      CompressibilityPhasicitySpontaneityPropertiesThrombus Aging +---------+---------------+---------+-----------+----------+--------------+ CFV      Full           Yes      Yes                                 +---------+---------------+---------+-----------+----------+--------------+ SFJ      Full                                                        +---------+---------------+---------+-----------+----------+--------------+ FV Prox  Full                                                        +---------+---------------+---------+-----------+----------+--------------+ FV Mid   Full                                                        +---------+---------------+---------+-----------+----------+--------------+ FV DistalFull                                                        +---------+---------------+---------+-----------+----------+--------------+ PFV      Full                                                        +---------+---------------+---------+-----------+----------+--------------+ POP      Full           Yes      Yes                                 +---------+---------------+---------+-----------+----------+--------------+ PTV      Full                                                        +---------+---------------+---------+-----------+----------+--------------+ PERO     Full                                                        +---------+---------------+---------+-----------+----------+--------------+       Summary: RIGHT: - There is no evidence of deep vein thrombosis in the lower extremity.  - No cystic structure found in the popliteal fossa.  LEFT: - There is no evidence of deep vein thrombosis in the lower extremity.  - No cystic structure found in the popliteal fossa.  *See table(s) above for measurements and observations. Electronically signed by Sherald Hesshristopher Clark MD on 09/09/2020 at 4:58:35 PM.    Final    DG OR LOCAL  ABDOMEN  Result Date: 09/08/2020 CLINICAL DATA:  Instrument count. EXAM: OR LOCAL ABDOMEN COMPARISON:  CT abdomen pelvis dated March 07, 2020. FINDINGS: L5-S1 ALIF hardware. Two surgical clips inferior to the hardware overlying the sacrum. No radiopaque foreign body identified. Mildly dilated small bowel loop within the central abdomen. IMPRESSION: 1. No radiopaque foreign body identified. 2. Mildly dilated small bowel loop within the central abdomen, likely ileus. These results were called by telephone at the time of interpretation on 09/08/2020 at 10:32 am to provider [facility] LLC[facility]GAN THOMAS, who verbally acknowledged these results. Electronically Signed   By: Obie DredgeWilliam T Derry M.D.   On: 09/08/2020 10:33    Discharge Instructions    Discharge patient   Complete by: As directed    Discharge disposition: 01-Home or Self Care   Discharge patient date: 09/10/2020   Incentive spirometry RT   Complete by: As directed        Follow-up Information    Venita LickBrooks, Dahari, MD. Schedule an appointment as soon as possible for a visit in 2 weeks.   Specialty: Orthopedic Surgery Why: If symptoms worsen, For suture removal, For wound re-check Contact information: [address] [place] [address] [phone]               Discharge Plan:  discharge to home  Disposition: stable    Signed: Leonette MonarchAmanda  N Neala Miggins for [facility] Grasiela Jonsson PA-C [facility] [phone] 09/12/2020, 1:15 PM

## 2020-09-26 ENCOUNTER — Telehealth: Payer: Self-pay

## 2020-09-26 NOTE — Telephone Encounter (Signed)
Direct transfer into Triage from Robin with [facility] stating this pt has sustained a syncopal episode in their office this morning. She states he is awake and alert at this time but has been complaining of not feeling well. He is 2 weeks status post lumbar fusion. BP 112/76 at the time of our call. Zella Ball states that Dr. Shon Baton is seeking recommendations as to disposition of the patient. After consulting with DOD Dr. Herbie Baltimore, pt is advised to go to the emergency room.

## 2020-09-26 NOTE — Telephone Encounter (Signed)
He has a history of neurocardiogenic syncope, which is probably what happened today. Please instruct to eat a salty snack and drink plenty of fluids. Does not look like he went to ED.

## 2020-09-26 NOTE — Telephone Encounter (Addendum)
Spoke with the patient. The patient stated that these episodes do happen and it is not new to him. He stated that it normally happens when he has things done such as giving blood and when he had his stitches out today.  He has been made aware of Dr. Erin Hearing recommendations and will call back if anything further is needed.

## 2020-11-11 ENCOUNTER — Ambulatory Visit (INDEPENDENT_AMBULATORY_CARE_PROVIDER_SITE_OTHER): Payer: BC Managed Care – PPO

## 2020-11-11 DIAGNOSIS — R001 Bradycardia, unspecified: Secondary | ICD-10-CM | POA: Diagnosis not present

## 2020-11-12 LAB — CUP PACEART REMOTE DEVICE CHECK
Battery Impedance: 2009 Ohm
Battery Remaining Longevity: 39 mo
Battery Voltage: 2.75 V
Brady Statistic AP VP Percent: 0 %
Brady Statistic AP VS Percent: 5 %
Brady Statistic AS VP Percent: 0 %
Brady Statistic AS VS Percent: 95 %
Date Time Interrogation Session: 20220218124717
Implantable Lead Implant Date: 20090127
Implantable Lead Implant Date: 20090127
Implantable Lead Location: 753859
Implantable Lead Location: 753860
Implantable Lead Model: 5076
Implantable Lead Model: 5076
Implantable Pulse Generator Implant Date: 20090127
Lead Channel Impedance Value: 488 Ohm
Lead Channel Impedance Value: 595 Ohm
Lead Channel Pacing Threshold Amplitude: 0.5 V
Lead Channel Pacing Threshold Amplitude: 0.625 V
Lead Channel Pacing Threshold Pulse Width: 0.4 ms
Lead Channel Pacing Threshold Pulse Width: 0.4 ms
Lead Channel Setting Pacing Amplitude: 2 V
Lead Channel Setting Pacing Amplitude: 2.5 V
Lead Channel Setting Pacing Pulse Width: 0.4 ms
Lead Channel Setting Sensing Sensitivity: 5.6 mV

## 2020-11-15 NOTE — Progress Notes (Signed)
Remote pacemaker transmission.   

## 2020-11-29 ENCOUNTER — Other Ambulatory Visit: Payer: Self-pay | Admitting: Internal Medicine

## 2020-11-29 DIAGNOSIS — E1159 Type 2 diabetes mellitus with other circulatory complications: Secondary | ICD-10-CM

## 2020-11-29 DIAGNOSIS — Z794 Long term (current) use of insulin: Secondary | ICD-10-CM

## 2020-12-02 ENCOUNTER — Other Ambulatory Visit: Payer: Self-pay | Admitting: Internal Medicine

## 2020-12-02 DIAGNOSIS — Z794 Long term (current) use of insulin: Secondary | ICD-10-CM

## 2020-12-02 DIAGNOSIS — I10 Essential (primary) hypertension: Secondary | ICD-10-CM

## 2020-12-02 DIAGNOSIS — E1159 Type 2 diabetes mellitus with other circulatory complications: Secondary | ICD-10-CM

## 2021-02-10 ENCOUNTER — Ambulatory Visit (INDEPENDENT_AMBULATORY_CARE_PROVIDER_SITE_OTHER): Payer: BC Managed Care – PPO

## 2021-02-10 DIAGNOSIS — Z950 Presence of cardiac pacemaker: Secondary | ICD-10-CM | POA: Diagnosis not present

## 2021-02-10 DIAGNOSIS — I480 Paroxysmal atrial fibrillation: Secondary | ICD-10-CM | POA: Diagnosis not present

## 2021-02-13 LAB — CUP PACEART REMOTE DEVICE CHECK
Battery Impedance: 1951 Ohm
Battery Remaining Longevity: 40 mo
Battery Voltage: 2.75 V
Brady Statistic AP VP Percent: 0 %
Brady Statistic AP VS Percent: 5 %
Brady Statistic AS VP Percent: 0 %
Brady Statistic AS VS Percent: 95 %
Date Time Interrogation Session: 20220520180943
Implantable Lead Implant Date: 20090127
Implantable Lead Implant Date: 20090127
Implantable Lead Location: 753859
Implantable Lead Location: 753860
Implantable Lead Model: 5076
Implantable Lead Model: 5076
Implantable Pulse Generator Implant Date: 20090127
Lead Channel Impedance Value: 467 Ohm
Lead Channel Impedance Value: 614 Ohm
Lead Channel Pacing Threshold Amplitude: 0.375 V
Lead Channel Pacing Threshold Amplitude: 0.625 V
Lead Channel Pacing Threshold Pulse Width: 0.4 ms
Lead Channel Pacing Threshold Pulse Width: 0.4 ms
Lead Channel Setting Pacing Amplitude: 2 V
Lead Channel Setting Pacing Amplitude: 2.5 V
Lead Channel Setting Pacing Pulse Width: 0.4 ms
Lead Channel Setting Sensing Sensitivity: 5.6 mV

## 2021-02-27 NOTE — Progress Notes (Signed)
Remote pacemaker transmission.   

## 2021-05-12 ENCOUNTER — Ambulatory Visit (INDEPENDENT_AMBULATORY_CARE_PROVIDER_SITE_OTHER): Payer: BC Managed Care – PPO

## 2021-05-12 DIAGNOSIS — I480 Paroxysmal atrial fibrillation: Secondary | ICD-10-CM

## 2021-05-16 LAB — CUP PACEART REMOTE DEVICE CHECK
Battery Impedance: 2100 Ohm
Battery Remaining Longevity: 37 mo
Battery Voltage: 2.76 V
Brady Statistic AP VP Percent: 0 %
Brady Statistic AP VS Percent: 5 %
Brady Statistic AS VP Percent: 0 %
Brady Statistic AS VS Percent: 95 %
Date Time Interrogation Session: 20220821104328
Implantable Lead Implant Date: 20090127
Implantable Lead Implant Date: 20090127
Implantable Lead Location: 753859
Implantable Lead Location: 753860
Implantable Lead Model: 5076
Implantable Lead Model: 5076
Implantable Pulse Generator Implant Date: 20090127
Lead Channel Impedance Value: 526 Ohm
Lead Channel Impedance Value: 566 Ohm
Lead Channel Pacing Threshold Amplitude: 0.5 V
Lead Channel Pacing Threshold Amplitude: 0.75 V
Lead Channel Pacing Threshold Pulse Width: 0.4 ms
Lead Channel Pacing Threshold Pulse Width: 0.4 ms
Lead Channel Setting Pacing Amplitude: 2 V
Lead Channel Setting Pacing Amplitude: 2.5 V
Lead Channel Setting Pacing Pulse Width: 0.4 ms
Lead Channel Setting Sensing Sensitivity: 5.6 mV

## 2021-05-30 NOTE — Progress Notes (Signed)
Remote pacemaker transmission.   
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
## 2021-06-26 ENCOUNTER — Ambulatory Visit (INDEPENDENT_AMBULATORY_CARE_PROVIDER_SITE_OTHER): Payer: BC Managed Care – PPO | Admitting: Ophthalmology

## 2021-06-26 ENCOUNTER — Encounter (INDEPENDENT_AMBULATORY_CARE_PROVIDER_SITE_OTHER): Payer: Self-pay | Admitting: Ophthalmology

## 2021-06-26 ENCOUNTER — Other Ambulatory Visit: Payer: Self-pay

## 2021-06-26 DIAGNOSIS — H25813 Combined forms of age-related cataract, bilateral: Secondary | ICD-10-CM

## 2021-06-26 DIAGNOSIS — E119 Type 2 diabetes mellitus without complications: Secondary | ICD-10-CM

## 2021-06-26 DIAGNOSIS — H35033 Hypertensive retinopathy, bilateral: Secondary | ICD-10-CM | POA: Diagnosis not present

## 2021-06-26 DIAGNOSIS — H3581 Retinal edema: Secondary | ICD-10-CM

## 2021-06-26 DIAGNOSIS — I10 Essential (primary) hypertension: Secondary | ICD-10-CM

## 2021-06-28 ENCOUNTER — Encounter (INDEPENDENT_AMBULATORY_CARE_PROVIDER_SITE_OTHER): Payer: Self-pay | Admitting: Ophthalmology

## 2021-08-08 ENCOUNTER — Other Ambulatory Visit: Payer: Self-pay | Admitting: Internal Medicine

## 2021-08-14 ENCOUNTER — Other Ambulatory Visit: Payer: Self-pay

## 2021-08-14 ENCOUNTER — Encounter: Payer: Self-pay | Admitting: Cardiovascular Disease

## 2021-08-14 ENCOUNTER — Ambulatory Visit (INDEPENDENT_AMBULATORY_CARE_PROVIDER_SITE_OTHER): Payer: BC Managed Care – PPO | Admitting: Cardiovascular Disease

## 2021-08-14 VITALS — BP 114/60 | HR 86 | Ht 68.0 in | Wt 176.0 lb

## 2021-08-14 DIAGNOSIS — R55 Syncope and collapse: Secondary | ICD-10-CM | POA: Diagnosis not present

## 2021-08-14 DIAGNOSIS — I10 Essential (primary) hypertension: Secondary | ICD-10-CM | POA: Diagnosis not present

## 2021-08-14 DIAGNOSIS — I480 Paroxysmal atrial fibrillation: Secondary | ICD-10-CM

## 2021-08-14 DIAGNOSIS — Z950 Presence of cardiac pacemaker: Secondary | ICD-10-CM

## 2021-08-14 DIAGNOSIS — R5383 Other fatigue: Secondary | ICD-10-CM

## 2021-08-14 DIAGNOSIS — E119 Type 2 diabetes mellitus without complications: Secondary | ICD-10-CM | POA: Diagnosis not present

## 2021-08-14 MED ORDER — BASAGLAR KWIKPEN 100 UNIT/ML ~~LOC~~ SOPN: 30.0000 [IU] | PEN_INJECTOR | Freq: Every day | SUBCUTANEOUS | Status: DC

## 2021-08-14 NOTE — Patient Instructions (Signed)
Medication Instructions:  No changes *If you need a refill on your cardiac medications before your next appointment, please call your pharmacy*   Lab Work: Your provider would like for you to have the following labs today: Lipid, A1C, CMET, TSH and CBC  If you have labs (blood work) drawn today and your tests are completely normal, you will receive your results only by: MyChart Message (if you have MyChart) OR A paper copy in the mail If you have any lab test that is abnormal or we need to change your treatment, we will call you to review the results.   Testing/Procedures: None ordered   Follow-Up: At [facility], you and your health needs are our priority.  As part of our continuing mission to provide you with exceptional heart care, we have created designated Provider Care Teams.  These Care Teams include your primary Cardiologist (physician) and Advanced Practice Providers (APPs -  Physician Assistants and Nurse Practitioners) who all work together to provide you with the care you need, when you need it.  We recommend signing up for the patient portal called "MyChart".  Sign up information is provided on this After Visit Summary.  MyChart is used to connect with patients for Virtual Visits (Telemedicine).  Patients are able to view lab/test results, encounter notes, upcoming appointments, etc.  Non-urgent messages can be sent to your provider as well.   To learn more about what you can do with MyChart, go to [email or web address].    Your next appointment:   12 month(s)  The format for your next appointment:   In Person  Provider:   Thurmon Fair, MD
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
## 2021-08-15 LAB — CBC
Hematocrit: 46 % (ref 37.5–51.0)
Hemoglobin: 15.8 g/dL (ref 13.0–17.7)
MCH: 33.9 pg — ABNORMAL HIGH (ref 26.6–33.0)
MCHC: 34.3 g/dL (ref 31.5–35.7)
MCV: 99 fL — ABNORMAL HIGH (ref 79–97)
Platelets: 125 10*3/uL — ABNORMAL LOW (ref 150–450)
RBC: 4.66 x10E6/uL (ref 4.14–5.80)
RDW: 13.1 % (ref 11.6–15.4)
WBC: 5.5 10*3/uL (ref 3.4–10.8)

## 2021-08-15 LAB — COMPREHENSIVE METABOLIC PANEL
ALT: 11 IU/L (ref 0–44)
AST: 19 IU/L (ref 0–40)
Albumin/Globulin Ratio: 1.8 (ref 1.2–2.2)
Albumin: 4.3 g/dL (ref 3.8–4.9)
Alkaline Phosphatase: 70 IU/L (ref 44–121)
BUN/Creatinine Ratio: 14 (ref 9–20)
BUN: 13 mg/dL (ref 6–24)
Bilirubin Total: 0.4 mg/dL (ref 0.0–1.2)
CO2: 25 mmol/L (ref 20–29)
Calcium: 8.7 mg/dL (ref 8.7–10.2)
Chloride: 100 mmol/L (ref 96–106)
Creatinine, Ser: 0.96 mg/dL (ref 0.76–1.27)
Globulin, Total: 2.4 g/dL (ref 1.5–4.5)
Glucose: 336 mg/dL — ABNORMAL HIGH (ref 70–99)
Potassium: 5.3 mmol/L — ABNORMAL HIGH (ref 3.5–5.2)
Sodium: 139 mmol/L (ref 134–144)
Total Protein: 6.7 g/dL (ref 6.0–8.5)
eGFR: 93 mL/min/{1.73_m2} (ref >59)

## 2021-08-15 LAB — LIPID PANEL
Chol/HDL Ratio: 4.9 ratio (ref 0.0–5.0)
Cholesterol, Total: 181 mg/dL (ref 100–199)
HDL: 37 mg/dL — ABNORMAL LOW (ref >39)
LDL Chol Calc (NIH): 112 mg/dL — ABNORMAL HIGH (ref 0–99)
Triglycerides: 182 mg/dL — ABNORMAL HIGH (ref 0–149)
VLDL Cholesterol Cal: 32 mg/dL (ref 5–40)

## 2021-08-15 LAB — TSH: TSH: 4.43 u[IU]/mL (ref 0.450–4.500)

## 2021-08-15 LAB — HEMOGLOBIN A1C
Est. average glucose Bld gHb Est-mCnc: 166 mg/dL
Hgb A1c MFr Bld: 7.4 % — ABNORMAL HIGH (ref 4.8–5.6)

## 2021-11-10 ENCOUNTER — Ambulatory Visit (INDEPENDENT_AMBULATORY_CARE_PROVIDER_SITE_OTHER): Payer: BC Managed Care – PPO

## 2021-11-10 DIAGNOSIS — I480 Paroxysmal atrial fibrillation: Secondary | ICD-10-CM

## 2021-11-11 LAB — CUP PACEART REMOTE DEVICE CHECK
Battery Impedance: 2323 Ohm
Battery Remaining Longevity: 34 mo
Battery Voltage: 2.74 V
Brady Statistic AP VP Percent: 0 %
Brady Statistic AP VS Percent: 7 %
Brady Statistic AS VP Percent: 0 %
Brady Statistic AS VS Percent: 93 %
Date Time Interrogation Session: 20230217151151
Implantable Lead Implant Date: 20090127
Implantable Lead Implant Date: 20090127
Implantable Lead Location: 753859
Implantable Lead Location: 753860
Implantable Lead Model: 5076
Implantable Lead Model: 5076
Implantable Pulse Generator Implant Date: 20090127
Lead Channel Impedance Value: 462 Ohm
Lead Channel Impedance Value: 578 Ohm
Lead Channel Pacing Threshold Amplitude: 0.5 V
Lead Channel Pacing Threshold Amplitude: 0.75 V
Lead Channel Pacing Threshold Pulse Width: 0.4 ms
Lead Channel Pacing Threshold Pulse Width: 0.4 ms
Lead Channel Setting Pacing Amplitude: 2 V
Lead Channel Setting Pacing Amplitude: 2.5 V
Lead Channel Setting Pacing Pulse Width: 0.4 ms
Lead Channel Setting Sensing Sensitivity: 5.6 mV

## 2021-11-16 NOTE — Progress Notes (Signed)
Remote pacemaker transmission.   

## 2022-01-21 ENCOUNTER — Ambulatory Visit
Admission: EM | Admit: 2022-01-21 | Discharge: 2022-01-21 | Disposition: A | Discharge status: Home / Self Care | Payer: BC Managed Care – PPO | Attending: Nurse Practitioner | Admitting: Nurse Practitioner

## 2022-01-21 DIAGNOSIS — N5089 Other specified disorders of the male genital organs: Secondary | ICD-10-CM | POA: Insufficient documentation

## 2022-01-21 LAB — POCT URINALYSIS DIP (MANUAL ENTRY)
Bilirubin, UA: NEGATIVE
Blood, UA: NEGATIVE
Glucose, UA: NEGATIVE mg/dL
Leukocytes, UA: NEGATIVE
Nitrite, UA: NEGATIVE
Protein Ur, POC: 100 mg/dL — AB
Spec Grav, UA: >=1.03 — AB (ref 1.010–1.025)
Urobilinogen, UA: 0.2 E.U./dL
pH, UA: 5.5 (ref 5.0–8.0)

## 2022-01-21 MED ORDER — VALACYCLOVIR HCL 1 G PO TABS: 1000.0000 mg | ORAL_TABLET | Freq: Two times a day (BID) | ORAL | 0 refills | Status: AC

## 2022-01-21 NOTE — Discharge Instructions (Addendum)
Take medication as prescribed. ?Do not have sexual intercourse until we receive your test results.  Your results should be available within the next 48 to 72 hours.  Access your MyChart account with the information provided today so you have access to your results. ?Stop use of the alcohol which is increasing the irritation and pain. ?Follow-up on 01/24/2022 for further evaluations if your symptoms do not improve. ?

## 2022-01-21 NOTE — ED Provider Notes (Signed)
?[place] ? ? ? ?CSN: [id number] ?Arrival date & time: 01/21/22  1019 ? ? ?  ? ?History   ?Chief Complaint ?Chief Complaint  ?Patient presents with  ? Genitalia problem  ? ? ?HPI ?Theodore [name] is a 56 y.o. male.  ? ?Patient is a 56 year old male who presents with genital lesion.  Symptoms have been present for the past week.  Patient states he noticed some irritation after having sex with his wife 1 week prior.  He states since that time, the area progressed from bruising to an open wound.  He states he has been trying to use Neosporin on the area without relief.  He states he then started using alcohol.  He states the area is nontender, but does experience some burning when he uses the alcohol.  He denies any penile discharge, urinary frequency, urgency, hematuria.  He states that he has 1 male partner, who is his wife and denies any other sexual contact in the past 8 years.  He also states prior to that he was married to his ex-wife who he was only with for the past 6 years.  He does report a history of genital warts which was several years ago.  He denies any other exposure to an STI or STD that he is aware of. ? ?The history is provided by the patient.  ? ?Past Medical History:  ?Diagnosis Date  ? Anxiety   ? Arthritis   ? Bilateral elbow joint pain   ? Chronic back pain 03/18/2018  ? DDD (degenerative disc disease), lumbar   ? Depression   ? Diabetes mellitus   ? dx 2010 type 2  ? Lumbar radiculopathy   ? Lumbar spine pain   ? Neurogenic claudication due to lumbar spinal stenosis   ? Neurogenic claudication due to lumbar spinal stenosis 05/10/2018  ? Pacemaker   ? Permanent pacemaker placement by Dr. Herbie Baltimore McQueen--10/21/2007--a Medtronic Adapta--serial number [id number]  ? Pain in joint of left shoulder   ? Post laminectomy syndrome 07/07/2020  ? Radiculopathy of lumbar region   ? Syncope 09/2007  ? 2D Echo--History of frequent episodes of syncope beginning at 56 years of age--usually  associated with some type of vagal issue but he does completely lose consciousness  ? ? ?Patient Active Problem List  ? Diagnosis Date Noted  ? S/P lumbar fusion 09/08/2020  ? Back pain 08/23/2020  ? Complication of surgical procedure 07/07/2020  ? Hypoglycemia due to insulin 03/07/2020  ? Atherosclerosis of coronary artery of native heart 03/07/2020  ? Essential hypertension 11/26/2019  ? Neurogenic orthostatic hypotension ([place]) 06/06/2018  ? Status post lumbar spine surgery for decompression of spinal cord 06/05/2018  ? Paroxysmal atrial fibrillation ([place]) 12/12/2017  ? Diabetes mellitus type 2 in nonobese ([place]) 12/12/2017  ? Neurocardiogenic syncope 07/23/2014  ? Pacemaker 07/23/2014  ? Tachycardia 07/23/2014  ? Type 2 diabetes mellitus with hyperglycemia ([place]) 10/20/2011  ? Sinoatrial bradycardia 10/20/2011  ? Depression with anxiety 10/20/2011  ? ? ?Past Surgical History:  ?Procedure Laterality Date  ? ABDOMINAL EXPOSURE  09/08/2020  ? Procedure: ABDOMINAL EXPOSURE;  Surgeon: Melina Schools, MD;  Location: [place];  Service: Orthopedics;;  ? ANTERIOR LUMBAR FUSION N/A 09/08/2020  ? Procedure: Anterior lumbar interbody fusion L5-S1, posterior spinal fusion interbody L5-S1;  Surgeon: Melina Schools, MD;  Location: [place];  Service: Orthopedics;  Laterality: N/A;  376min; move other case to follow  ? CARDIAC CATHETERIZATION Right 10/20/2007  ? COLONOSCOPY    ?  HAND SURGERY    ? HARVEST BONE GRAFT  09/08/2020  ? Procedure: HARVEST ILIAC BONE GRAFT;  Surgeon: Melina Schools, MD;  Location: [place];  Service: Orthopedics;;  ? LUMBAR LAMINECTOMY/DECOMPRESSION MICRODISCECTOMY N/A 06/05/2018  ? Procedure: Lumbar decompression L4-S1;  Surgeon: Melina Schools, MD;  Location: [place];  Service: Orthopedics;  Laterality: N/A;  3 hrs  ? PACEMAKER INSERTION  10/21/2007  ? Dr Tami Ribas  ? SHOULDER SURGERY  1999  ? arthroscopy  ? TONSILLECTOMY    ? ? ? ? ? ?Home Medications   ? ?Prior to Admission medications   ?Medication Sig Start Date  End Date Taking? Authorizing Provider  ?valACYclovir (VALTREX) 1000 MG tablet Take 1 tablet (1,000 mg total) by mouth 2 (two) times daily for 7 days. 01/21/22 01/28/22 Yes Uriah Philipson-Warren, Alda Lea, NP  ?ACCU-CHEK AVIVA PLUS test strip Check blood sugars twice a day. Dx: E11.59, Z79.4 02/01/20   Nicolette Bang, MD  ?atorvastatin (LIPITOR) 10 MG tablet Take 10 mg by mouth daily. 08/23/20   [provider]  ?enoxaparin (LOVENOX) 40 MG/0.4ML injection Inject 0.4 mLs (40 mg total) into the skin daily for 10 days. 10 day supply ?1 injection per day 09/08/20 09/18/20  Melina Schools, MD  ?insulin aspart (NOVOLOG) 100 UNIT/ML injection Inject 2 Units into the skin 3 (three) times daily with meals. ?Patient taking differently: Inject 2-9 Units into the skin 3 (three) times daily with meals. 12/30/19   Nicolette Bang, MD  ?Insulin Glargine [facility]) 100 UNIT/ML Inject 30 Units into the skin daily. 08/14/21   Croitoru, Mihai, MD  ?Insulin Pen Needle (B-D UF III MINI PEN NEEDLES) 31G X 5 MM MISC BD Ultra-Fine Short Pen Needle 31 gauge x 5/16"  Use with Basaglar Claiborne Rigg 11/30/19   Nicolette Bang, MD  ?lisinopril (ZESTRIL) 5 MG tablet Take 1 tablet (5 mg total) by mouth daily. 06/09/20   Nicolette Bang, MD  ? ? ?Family History ?Family History  ?Problem Relation Age of Onset  ? Diabetes Sister   ?     type 2  ? Diabetes Mother   ?     type 2  ? Coronary artery disease Mother 22's  ? Hypertension Mother   ? Stroke Mother   ? ? ?Social History ?Social History  ? ?Tobacco Use  ? Smoking status: Former  ?  Packs/day: 1.00  ?  Years: 30.00  ?  Pack years: 30.00  ?  Types: Cigarettes  ?  Quit date: 10/17/2007  ?  Years since quitting: 14.2  ? Smokeless tobacco: Current  ?  Types: Chew  ? Tobacco comments:  ?  occasionally uses chewing tobacco but not on a regular basic  ?Vaping Use  ? Vaping Use: Never used  ?Substance Use Topics  ? Alcohol use: No  ? Drug use: No  ? ? ? ?Allergies    ?Cephalexin, Cephalexin, and Vancomycin ? ? ?Review of Systems ?Review of Systems  ?Constitutional: Negative.   ?Cardiovascular: Negative.   ?Gastrointestinal: Negative.   ?Genitourinary:  Positive for genital sores. Negative for decreased urine volume, penile discharge, penile pain, penile swelling, scrotal swelling, testicular pain and urgency.  ?Skin:  Positive for wound (penile shaft).  ?Psychiatric/Behavioral: Negative.    ? ? ?Physical Exam ?Triage Vital Signs ?ED Triage Vitals  ?Enc Vitals Group  ?   BP 01/21/22 1118 (!) 158/96  ?   Pulse Rate 01/21/22 1118 84  ?   Resp 01/21/22 1118 16  ?  Temp 01/21/22 1118 97.9 ?F (36.6 ?C)  ?   Temp Source 01/21/22 1118 Oral  ?   SpO2 01/21/22 1118 97 %  ?   Weight --   ?   Height --   ?   Head Circumference --   ?   Peak Flow --   ?   Pain Score 01/21/22 1121 0  ?   Pain Loc --   ?   Pain Edu? --   ?   Excl. in [place]? --   ? ?No data found. ? ?Updated Vital Signs ?BP (!) 158/96 (BP Location: Right Arm)   Pulse 84   Temp 97.9 ?F (36.6 ?C) (Oral)   Resp 16   SpO2 97%  ? ?Visual Acuity ?Right Eye Distance:   ?Left Eye Distance:   ?Bilateral Distance:   ? ?Right Eye Near:   ?Left Eye Near:    ?Bilateral Near:    ? ?Physical Exam ?Vitals reviewed.  ?Constitutional:   ?   Appearance: Normal appearance.  ?HENT:  ?   Head: Normocephalic and atraumatic.  ?Abdominal:  ?   Hernia: There is no hernia in the right inguinal area.  ?Genitourinary: ?   Penis: Circumcised. Lesions (right penile shaft, approximate 1cm lesion) present. No erythema or discharge.   ?   Testes: Normal.     ?   Right: Mass, tenderness or swelling not present. Right testis is descended.  ?   Epididymis:  ?   Right: Normal.  ?Lymphadenopathy:  ?   Lower Body: No right inguinal adenopathy.  ?Neurological:  ?   Mental Status: He is alert.  ? ? ? ?UC Treatments / Results  ?Labs ?(all labs ordered are listed, but only abnormal results are displayed) ?Labs Reviewed  ?POCT URINALYSIS DIP (MANUAL ENTRY) - Abnormal;  Notable for the following components:  ?    Result Value  ? Clarity, UA hazy (*)   ? Ketones, POC UA moderate (40) (*)   ? Spec Grav, UA >=1.030 (*)   ? Protein Ur, POC =100 (*)   ? All other components within

## 2022-01-21 NOTE — ED Triage Notes (Signed)
Pt reports discomfort in the genitalia after he had intercourse with his wife 1 week ago. States the skin felt raw, he used Neosporin x 1 week, alcohol x 1 day.  ?

## 2022-01-23 LAB — HIV ANTIBODY (ROUTINE TESTING W REFLEX): HIV Screen 4th Generation wRfx: NONREACTIVE

## 2022-01-23 LAB — RPR: RPR Ser Ql: NONREACTIVE

## 2022-01-24 LAB — HSV CULTURE AND TYPING

## 2022-02-09 ENCOUNTER — Other Ambulatory Visit: Payer: Self-pay | Admitting: Physician Assistant

## 2022-02-09 ENCOUNTER — Ambulatory Visit (INDEPENDENT_AMBULATORY_CARE_PROVIDER_SITE_OTHER): Payer: BC Managed Care – PPO

## 2022-02-09 ENCOUNTER — Ambulatory Visit
Admission: RE | Admit: 2022-02-09 | Discharge: 2022-02-09 | Disposition: A | Discharge status: Home / Self Care | Payer: BC Managed Care – PPO | Source: Ambulatory Visit | Attending: Physician Assistant | Admitting: Physician Assistant

## 2022-02-09 DIAGNOSIS — R198 Other specified symptoms and signs involving the digestive system and abdomen: Secondary | ICD-10-CM

## 2022-02-09 DIAGNOSIS — R001 Bradycardia, unspecified: Secondary | ICD-10-CM | POA: Diagnosis not present

## 2022-02-12 LAB — CUP PACEART REMOTE DEVICE CHECK
Battery Impedance: 2394 Ohm
Battery Remaining Longevity: 33 mo
Battery Voltage: 2.74 V
Brady Statistic AP VP Percent: 0 %
Brady Statistic AP VS Percent: 8 %
Brady Statistic AS VP Percent: 0 %
Brady Statistic AS VS Percent: 92 %
Date Time Interrogation Session: 20230520111354
Implantable Lead Implant Date: 20090127
Implantable Lead Implant Date: 20090127
Implantable Lead Location: 753859
Implantable Lead Location: 753860
Implantable Lead Model: 5076
Implantable Lead Model: 5076
Implantable Pulse Generator Implant Date: 20090127
Lead Channel Impedance Value: 498 Ohm
Lead Channel Impedance Value: 558 Ohm
Lead Channel Pacing Threshold Amplitude: 0.5 V
Lead Channel Pacing Threshold Amplitude: 0.75 V
Lead Channel Pacing Threshold Pulse Width: 0.4 ms
Lead Channel Pacing Threshold Pulse Width: 0.4 ms
Lead Channel Setting Pacing Amplitude: 2 V
Lead Channel Setting Pacing Amplitude: 2.5 V
Lead Channel Setting Pacing Pulse Width: 0.4 ms
Lead Channel Setting Sensing Sensitivity: 5.6 mV

## 2022-02-15 NOTE — Progress Notes (Signed)
Remote pacemaker transmission.   

## 2022-06-14 NOTE — Progress Notes (Addendum)
[facility] - Clinic Note  06/26/2022     CHIEF COMPLAINT Patient presents for Retina Follow Up    HISTORY OF PRESENT ILLNESS: Theodore [name] is a 56 y.o. male who presents to the clinic today for:   HPI     Retina Follow Up   Patient presents with  Diabetic Retinopathy.  In both eyes.  This started 1 year ago.  I, the attending physician,  performed the HPI with the patient and updated documentation appropriately.        Comments   Patient here for 1 year retina follow up for DM OU. Patient states vision doing good. Eyes tired a lot. No eye pain. Looking from cell phone up and around eyes need to adjust.      Last edited by Rennis Chris, MD on 06/26/2022  4:39 PM.    Pt states he is keeping his blood sugar and blood pressure under control   Referring physician: Maggie Font, [facility] [address] [place],  [place] [address]  HISTORICAL INFORMATION:   Selected notes from the MEDICAL RECORD NUMBER Referred by Joaquin Courts, FNP for DM exam LEE:  Ocular Hx- PMH-anxiety, arthritis, depression, pacemaker, DM (A1c: 7.4 [03.20], takes metformin, basaglar)   CURRENT MEDICATIONS: No current outpatient medications on file. (Ophthalmic Drugs)   No current facility-administered medications for this visit. (Ophthalmic Drugs)   Current Outpatient Medications (Other)  Medication Sig   ACCU-CHEK AVIVA PLUS test strip Check blood sugars twice a day. Dx: E11.59, Z79.4   atorvastatin (LIPITOR) 10 MG tablet Take 10 mg by mouth daily.   insulin aspart (NOVOLOG) 100 UNIT/ML injection Inject 2 Units into the skin 3 (three) times daily with meals. (Patient taking differently: Inject 2-9 Units into the skin 3 (three) times daily with meals.)   Insulin Glargine (BASAGLAR KWIKPEN) 100 UNIT/ML Inject 30 Units into the skin daily.   Insulin Pen Needle (B-D UF III MINI PEN NEEDLES) 31G X 5 MM MISC BD Ultra-Fine Short Pen Needle 31 gauge x 5/16"  Use with Basaglar  Kwikpen   lisinopril (ZESTRIL) 5 MG tablet Take 1 tablet (5 mg total) by mouth daily.   enoxaparin (LOVENOX) 40 MG/0.4ML injection Inject 0.4 mLs (40 mg total) into the skin daily for 10 days. 10 day supply 1 injection per day   No current facility-administered medications for this visit. (Other)   REVIEW OF SYSTEMS: ROS   Positive for: Endocrine, Cardiovascular, Eyes Negative for: Constitutional, Gastrointestinal, Neurological, Skin, Genitourinary, Musculoskeletal, HENT, Respiratory, Psychiatric, Allergic/Imm, Heme/Lymph Last edited by Laddie Aquas, COA on 06/26/2022  3:17 PM.     ALLERGIES Allergies  Allergen Reactions   Cephalexin Hives   Cephalexin Hives   Vancomycin Hives, Itching and Other (See Comments)    True Allergic Reaction -- Not "Red Man's Syndrome Onset of prominent red rash, severe itching/hives Not responsive to Benadryl and IV rate reduction. First seen pre-op for surgery on 06/05/18.   PAST MEDICAL HISTORY Past Medical History:  Diagnosis Date   Anxiety    Arthritis    Bilateral elbow joint pain    Chronic back pain 03/18/2018   DDD (degenerative disc disease), lumbar    Depression    Diabetes mellitus    dx 2010 type 2   Lumbar radiculopathy    Lumbar spine pain    Neurogenic claudication due to lumbar spinal stenosis    Neurogenic claudication due to lumbar spinal stenosis 05/10/2018   Pacemaker  Permanent pacemaker placement by Dr. Molly Maduroobert McQueen--10/21/2007--a [employer] Adapta--serial number #[id number]   Pain in joint of left shoulder    Post laminectomy syndrome 07/07/2020   Radiculopathy of lumbar region    Syncope 09/2007   2D Echo--History of frequent episodes of syncope beginning at 56 years of age--usually associated with some type of vagal issue but he does completely lose consciousness   Past Surgical History:  Procedure Laterality Date   ABDOMINAL EXPOSURE  09/08/2020   Procedure: ABDOMINAL EXPOSURE;  Surgeon: Venita LickBrooks, Dahari,  MD;  [facility];  Service: Orthopedics;;   ANTERIOR LUMBAR FUSION N/A 09/08/2020   Procedure: Anterior lumbar interbody fusion L5-S1, posterior spinal fusion interbody L5-S1;  Surgeon: Venita LickBrooks, Dahari, MD;  [facility];  Service: Orthopedics;  Laterality: N/A;  300min; move other case to follow   CARDIAC CATHETERIZATION Right 10/20/2007   COLONOSCOPY     HAND SURGERY     HARVEST BONE GRAFT  09/08/2020   Procedure: HARVEST ILIAC BONE GRAFT;  Surgeon: Venita LickBrooks, Dahari, MD;  [facility];  Service: Orthopedics;;   LUMBAR LAMINECTOMY/DECOMPRESSION MICRODISCECTOMY N/A 06/05/2018   Procedure: Lumbar decompression L4-S1;  Surgeon: Venita LickBrooks, Dahari, MD;  [facility];  Service: Orthopedics;  Laterality: N/A;  3 hrs   PACEMAKER INSERTION  10/21/2007   Dr Jenne CampusMcQueen   SHOULDER SURGERY  1999   arthroscopy   TONSILLECTOMY     FAMILY HISTORY Family History  Problem Relation Age of Onset   Diabetes Sister        type 2   Diabetes Mother        type 2   Coronary artery disease Mother 4050's   Hypertension Mother    Stroke Mother    SOCIAL HISTORY Social History   Tobacco Use   Smoking status: Former    Packs/day: 1.00    Years: 30.00    Total pack years: 30.00    Types: Cigarettes    Quit date: 10/17/2007    Years since quitting: 14.7   Smokeless tobacco: Current    Types: Chew   Tobacco comments:    occasionally uses chewing tobacco but not on a regular basic  Vaping Use   Vaping Use: Never used  Substance Use Topics   Alcohol use: No   Drug use: No       OPHTHALMIC EXAM: Base Eye Exam     Visual Acuity (Snellen - Linear)       Right Left   Dist [place] 20/20 20/20 -1         Tonometry (Tonopen, 3:14 PM)       Right Left   Pressure 16 15         Pupils       Dark Light Shape React APD   Right 3 2 Round Brisk None   Left 3 2 Round Brisk None         Visual Fields (Counting fingers)       Left Right    Full Full         Extraocular Movement        Right Left    Full, Ortho Full, Ortho         Neuro/Psych     Oriented x3: Yes   Mood/Affect: Normal         Dilation     Both eyes: 1.0% Mydriacyl, 2.5% Phenylephrine @ 3:14 PM           Slit Lamp and Fundus Exam  Slit Lamp Exam       Right Left   Lids/Lashes Dermatochalasis - upper lid Dermatochalasis - upper lid   Conjunctiva/Sclera White and quiet White and quiet   Cornea mild Arcus, mild tear film debris mild Arcus, mild tear film debris   Anterior Chamber Deep and quiet Deep and quiet   Iris Round and moderately dilated, No NVI Round and moderately dilated, No NVI   Lens 2+ Nuclear sclerosis, 1-2+ Cortical cataract 2+ Nuclear sclerosis, 1-2+ Cortical cataract   Anterior Vitreous mild Vitreous syneresis mild Vitreous syneresis         Fundus Exam       Right Left   Disc Compact, Pink and Sharp Pink and Sharp   C/D Ratio 0.1 0.2   Macula Flat, Blunted foveal reflex, mild RPE mottling and clumping, No heme or edema Flat, good foveal reflex, mild RPE mottling and clumping, No heme or edema   Vessels Normal Normal   Periphery Attached, mild reticular degeneration, focal pavingstone inferiorly at 0600, no heme Attached, reticular degeneration, pigmented CR scar with atrophy at 0730, pigmented pavingstone inferiorly, No heme           IMAGING AND PROCEDURES  Imaging and Procedures for @TODAY @  OCT, Retina - OU - Both Eyes       Right Eye Quality was good. Central Foveal Thickness: 281. Progression has been stable. Findings include normal foveal contour, no IRF, no SRF, vitreomacular adhesion .   Left Eye Quality was good. Central Foveal Thickness: 289. Progression has been stable. Findings include normal foveal contour, no IRF, no SRF, vitreomacular adhesion .   Notes *Images captured and stored on drive  Diagnosis / Impression:  NFP, no IRF/SRF OU No DME OU  Clinical management:  See below  Abbreviations: NFP - Normal foveal profile. CME -  cystoid macular edema. PED - pigment epithelial detachment. IRF - intraretinal fluid. SRF - subretinal fluid. EZ - ellipsoid zone. ERM - epiretinal membrane. ORA - outer retinal atrophy. ORT - outer retinal tubulation. SRHM - subretinal hyper-reflective material            ASSESSMENT/PLAN:    ICD-10-CM   1. Diabetes mellitus type 2 without retinopathy ([place])  E11.9 OCT, Retina - OU - Both Eyes    2. Essential hypertension  I10     3. Hypertensive retinopathy of both eyes  H35.033     4. Combined forms of age-related cataract of both eyes  H25.813      1,2. Diabetes mellitus, type 2 without retinopathy OU  - A1c 7.4% on 11.21.22, 9.0% on 07.12.22 from 7.4% in 03.11.20  - The incidence, risk factors for progression, natural history and treatment options for diabetic retinopathy  were discussed with patient.    - The need for close monitoring of blood glucose, blood pressure, and serum lipids, avoiding cigarette or any type of tobacco, and the need for long term follow up was also discussed with patient.  - f/u in 1 year, sooner prn  3. Hypertensive retinopathy OU  - discussed importance of tight BP control  - monitor  4. Mixed form age related cataracts OU  - The symptoms of cataract, surgical options, and treatments and risks were discussed with patient.  - discussed diagnosis and progression  - monitor  Ophthalmic Meds Ordered this visit:  No orders of the defined types were placed in this encounter.     Return in about 1 year (around 06/27/2023).  There are no Patient Instructions  on file for this visit.  This document serves as a record of services personally performed by Karie Chimera, MD, PhD. It was created on their behalf by Gerilyn Nestle, COT an ophthalmic technician. The creation of this record is the provider's dictation and/or activities during the visit.    Electronically signed by:  Gerilyn Nestle, COT  06/14/22  4:42 PM  This document serves as a  record of services personally performed by Karie Chimera, MD, PhD. It was created on their behalf by Glee Arvin. Manson Passey, OA an ophthalmic technician. The creation of this record is the provider's dictation and/or activities during the visit.    Electronically signed by: Glee Arvin. Manson Passey, [place] 10.03.2023 4:42 PM  Karie Chimera, M.D., Ph.D. Diseases & Surgery of the Retina and Vitreous [facility]   I have reviewed the above documentation for accuracy and completeness, and I agree with the above. Karie Chimera, M.D., Ph.D. 06/26/22 4:42 PM  Abbreviations: M myopia (nearsighted); A astigmatism; H hyperopia (farsighted); P presbyopia; Mrx spectacle prescription;  CTL contact lenses; OD right eye; OS left eye; OU both eyes  XT exotropia; ET esotropia; PEK punctate epithelial keratitis; PEE punctate epithelial erosions; DES dry eye syndrome; MGD meibomian gland dysfunction; ATs artificial tears; PFAT's preservative free artificial tears; NSC nuclear sclerotic cataract; PSC posterior subcapsular cataract; ERM epi-retinal membrane; PVD posterior vitreous detachment; RD retinal detachment; DM diabetes mellitus; DR diabetic retinopathy; NPDR non-proliferative diabetic retinopathy; PDR proliferative diabetic retinopathy; CSME clinically significant macular edema; DME diabetic macular edema; dbh dot blot hemorrhages; CWS cotton wool spot; POAG primary open angle glaucoma; C/D cup-to-disc ratio; HVF humphrey visual field; GVF goldmann visual field; OCT optical coherence tomography; IOP intraocular pressure; BRVO Branch retinal vein occlusion; CRVO central retinal vein occlusion; CRAO central retinal artery occlusion; BRAO branch retinal artery occlusion; RT retinal tear; SB scleral buckle; PPV pars plana vitrectomy; VH Vitreous hemorrhage; PRP panretinal laser photocoagulation; IVK intravitreal kenalog; VMT vitreomacular traction; MH Macular hole;  NVD neovascularization of the disc; NVE  neovascularization elsewhere; AREDS age related eye disease study; ARMD age related macular degeneration; POAG primary open angle glaucoma; EBMD epithelial/anterior basement membrane dystrophy; ACIOL anterior chamber intraocular lens; IOL intraocular lens; PCIOL posterior chamber intraocular lens; Phaco/IOL phacoemulsification with intraocular lens placement; PRK photorefractive keratectomy; LASIK laser assisted in situ keratomileusis; HTN hypertension; DM diabetes mellitus; COPD chronic obstructive pulmonary disease

## 2022-06-26 ENCOUNTER — Encounter (INDEPENDENT_AMBULATORY_CARE_PROVIDER_SITE_OTHER): Payer: Self-pay | Admitting: Ophthalmology

## 2022-06-26 ENCOUNTER — Ambulatory Visit (INDEPENDENT_AMBULATORY_CARE_PROVIDER_SITE_OTHER): Payer: BC Managed Care – PPO | Admitting: Ophthalmology

## 2022-06-26 DIAGNOSIS — H35033 Hypertensive retinopathy, bilateral: Secondary | ICD-10-CM

## 2022-06-26 DIAGNOSIS — H25813 Combined forms of age-related cataract, bilateral: Secondary | ICD-10-CM

## 2022-06-26 DIAGNOSIS — E119 Type 2 diabetes mellitus without complications: Secondary | ICD-10-CM

## 2022-06-26 DIAGNOSIS — I10 Essential (primary) hypertension: Secondary | ICD-10-CM | POA: Diagnosis not present

## 2022-08-10 ENCOUNTER — Ambulatory Visit (INDEPENDENT_AMBULATORY_CARE_PROVIDER_SITE_OTHER): Payer: BC Managed Care – PPO

## 2022-08-10 DIAGNOSIS — I480 Paroxysmal atrial fibrillation: Secondary | ICD-10-CM | POA: Diagnosis not present

## 2022-08-13 LAB — CUP PACEART REMOTE DEVICE CHECK
Battery Impedance: 2634 Ohm
Battery Remaining Longevity: 30 mo
Battery Voltage: 2.73 V
Brady Statistic AP VP Percent: 0 %
Brady Statistic AP VS Percent: 7 %
Brady Statistic AS VP Percent: 0 %
Brady Statistic AS VS Percent: 92 %
Date Time Interrogation Session: 20231119192710
Implantable Lead Connection Status: 753985
Implantable Lead Connection Status: 753985
Implantable Lead Implant Date: 20090127
Implantable Lead Implant Date: 20090127
Implantable Lead Location: 753859
Implantable Lead Location: 753860
Implantable Lead Model: 5076
Implantable Lead Model: 5076
Implantable Pulse Generator Implant Date: 20090127
Lead Channel Impedance Value: 471 Ohm
Lead Channel Impedance Value: 580 Ohm
Lead Channel Pacing Threshold Amplitude: 0.5 V
Lead Channel Pacing Threshold Amplitude: 0.625 V
Lead Channel Pacing Threshold Pulse Width: 0.4 ms
Lead Channel Pacing Threshold Pulse Width: 0.4 ms
Lead Channel Setting Pacing Amplitude: 2 V
Lead Channel Setting Pacing Amplitude: 2.5 V
Lead Channel Setting Pacing Pulse Width: 0.4 ms
Lead Channel Setting Sensing Sensitivity: 5.6 mV
Zone Setting Status: 755011
Zone Setting Status: 755011

## 2022-08-20 ENCOUNTER — Ambulatory Visit: Payer: BC Managed Care – PPO | Attending: Cardiovascular Disease | Admitting: Cardiovascular Disease

## 2022-08-20 ENCOUNTER — Encounter: Payer: Self-pay | Admitting: Cardiovascular Disease

## 2022-08-20 VITALS — BP 112/76 | HR 67 | Ht 68.0 in | Wt 172.2 lb

## 2022-08-20 DIAGNOSIS — I480 Paroxysmal atrial fibrillation: Secondary | ICD-10-CM

## 2022-08-20 DIAGNOSIS — R55 Syncope and collapse: Secondary | ICD-10-CM | POA: Diagnosis not present

## 2022-08-20 DIAGNOSIS — E10649 Type 1 diabetes mellitus with hypoglycemia without coma: Secondary | ICD-10-CM

## 2022-08-20 DIAGNOSIS — I10 Essential (primary) hypertension: Secondary | ICD-10-CM | POA: Diagnosis not present

## 2022-08-20 DIAGNOSIS — Z950 Presence of cardiac pacemaker: Secondary | ICD-10-CM

## 2022-08-20 DIAGNOSIS — E7800 Pure hypercholesterolemia, unspecified: Secondary | ICD-10-CM

## 2022-08-20 NOTE — Patient Instructions (Signed)
Medication Instructions:  Your physician recommends that you continue on your current medications as directed. Please refer to the Current Medication list given to you today.  *If you need a refill on your cardiac medications before your next appointment, please call your pharmacy*   Follow-Up: At [facility], you and your health needs are our priority.  As part of our continuing mission to provide you with exceptional heart care, we have created designated Provider Care Teams.  These Care Teams include your primary Cardiologist (physician) and Advanced Practice Providers (APPs -  Physician Assistants and Nurse Practitioners) who all work together to provide you with the care you need, when you need it.  We recommend signing up for the patient portal called "MyChart".  Sign up information is provided on this After Visit Summary.  MyChart is used to connect with patients for Virtual Visits (Telemedicine).  Patients are able to view lab/test results, encounter notes, upcoming appointments, etc.  Non-urgent messages can be sent to your provider as well.   To learn more about what you can do with MyChart, go to [email or web address].    Your next appointment:   12 month(s)  The format for your next appointment:   In Person  Provider:   Mihai Croitoru, MD 
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
## 2022-08-29 NOTE — Progress Notes (Signed)
Remote pacemaker transmission.   

## 2022-09-06 ENCOUNTER — Ambulatory Visit: Payer: BC Managed Care – PPO | Admitting: Family

## 2023-02-08 ENCOUNTER — Ambulatory Visit (INDEPENDENT_AMBULATORY_CARE_PROVIDER_SITE_OTHER): Payer: BC Managed Care – PPO

## 2023-02-08 DIAGNOSIS — R001 Bradycardia, unspecified: Secondary | ICD-10-CM | POA: Diagnosis not present

## 2023-02-14 LAB — CUP PACEART REMOTE DEVICE CHECK
Battery Impedance: 3612 Ohm
Battery Remaining Longevity: 18 mo
Battery Voltage: 2.69 V
Brady Statistic AP VP Percent: 0 %
Brady Statistic AP VS Percent: 6 %
Brady Statistic AS VP Percent: 0 %
Brady Statistic AS VS Percent: 94 %
Date Time Interrogation Session: 20240522193705
Implantable Lead Connection Status: 753985
Implantable Lead Connection Status: 753985
Implantable Lead Implant Date: 20090127
Implantable Lead Implant Date: 20090127
Implantable Lead Location: 753859
Implantable Lead Location: 753860
Implantable Lead Model: 5076
Implantable Lead Model: 5076
Implantable Pulse Generator Implant Date: 20090127
Lead Channel Impedance Value: 432 Ohm
Lead Channel Impedance Value: 501 Ohm
Lead Channel Pacing Threshold Amplitude: 0.625 V
Lead Channel Pacing Threshold Amplitude: 0.75 V
Lead Channel Pacing Threshold Pulse Width: 0.4 ms
Lead Channel Pacing Threshold Pulse Width: 0.4 ms
Lead Channel Setting Pacing Amplitude: 2 V
Lead Channel Setting Pacing Amplitude: 2.5 V
Lead Channel Setting Pacing Pulse Width: 0.4 ms
Lead Channel Setting Sensing Sensitivity: 5.6 mV
Zone Setting Status: 755011
Zone Setting Status: 755011

## 2023-02-20 NOTE — Progress Notes (Signed)
Remote pacemaker transmission.   

## 2023-05-10 ENCOUNTER — Ambulatory Visit (INDEPENDENT_AMBULATORY_CARE_PROVIDER_SITE_OTHER): Payer: BC Managed Care – PPO

## 2023-05-10 DIAGNOSIS — R001 Bradycardia, unspecified: Secondary | ICD-10-CM

## 2023-05-14 LAB — CUP PACEART REMOTE DEVICE CHECK
Battery Impedance: 4788 Ohm
Battery Remaining Longevity: 9 mo
Battery Voltage: 2.66 V
Brady Statistic AP VP Percent: 0 %
Brady Statistic AP VS Percent: 6 %
Brady Statistic AS VP Percent: 0 %
Brady Statistic AS VS Percent: 94 %
Date Time Interrogation Session: 20240819194331
Implantable Lead Connection Status: 753985
Implantable Lead Connection Status: 753985
Implantable Lead Implant Date: 20090127
Implantable Lead Implant Date: 20090127
Implantable Lead Location: 753859
Implantable Lead Location: 753860
Implantable Lead Model: 5076
Implantable Lead Model: 5076
Implantable Pulse Generator Implant Date: 20090127
Lead Channel Impedance Value: 448 Ohm
Lead Channel Impedance Value: 554 Ohm
Lead Channel Pacing Threshold Amplitude: 0.5 V
Lead Channel Pacing Threshold Amplitude: 0.625 V
Lead Channel Pacing Threshold Pulse Width: 0.4 ms
Lead Channel Pacing Threshold Pulse Width: 0.4 ms
Lead Channel Setting Pacing Amplitude: 2 V
Lead Channel Setting Pacing Amplitude: 2.5 V
Lead Channel Setting Pacing Pulse Width: 0.4 ms
Lead Channel Setting Sensing Sensitivity: 5.6 mV
Zone Setting Status: 755011
Zone Setting Status: 755011

## 2023-05-20 NOTE — Progress Notes (Signed)
Remote pacemaker transmission.   

## 2023-06-18 NOTE — Progress Notes (Signed)
[facility] - Clinic Note  06/26/2023     CHIEF COMPLAINT Patient presents for Retina Follow Up    HISTORY OF PRESENT ILLNESS: Theodore [name] is a 57 y.o. male who presents to the clinic today for:   HPI     Retina Follow Up   Patient presents with  Diabetic Retinopathy.  In both eyes.  This started 1 year ago.  Duration of 1 year.  Since onset it is stable.  I, the attending physician,  performed the HPI with the patient and updated documentation appropriately.        Comments   1 year retina follow up DM exam pt is reporting no vision changes noticed he denies any flashes or floaters his last reading was 96 at lunch his last A1C 7.3 little over a month ago       Last edited by Rennis Chris, MD on 06/28/2023 11:42 PM.    Pts last A1c was 7.2 in August, he states last year it was 8.4, but he has changed what he is eating and drinking  Referring physician: Practice, [facility] [address] [place],  [place] [address]  HISTORICAL INFORMATION:   Selected notes from the MEDICAL RECORD NUMBER Referred by Joaquin Courts, FNP for DM exam LEE:  Ocular Hx- PMH-anxiety, arthritis, depression, pacemaker, DM (A1c: 7.4 [03.20], takes metformin, basaglar)   CURRENT MEDICATIONS: No current outpatient medications on file. (Ophthalmic Drugs)   No current facility-administered medications for this visit. (Ophthalmic Drugs)   Current Outpatient Medications (Other)  Medication Sig   ACCU-CHEK AVIVA PLUS test strip Check blood sugars twice a day. Dx: E11.59, Z79.4   atorvastatin (LIPITOR) 10 MG tablet Take 10 mg by mouth daily.   insulin aspart (NOVOLOG) 100 UNIT/ML injection Inject 2 Units into the skin 3 (three) times daily with meals. (Patient taking differently: Inject 2-9 Units into the skin 3 (three) times daily with meals.)   Insulin Glargine Solostar (LANTUS) 100 UNIT/ML Solostar Pen Inject 24 Units into the skin daily in the afternoon.    Insulin Pen Needle (B-D UF III MINI PEN NEEDLES) 31G X 5 MM MISC BD Ultra-Fine Short Pen Needle 31 gauge x 5/16"  Use with Basaglar Kwikpen   lisinopril (ZESTRIL) 5 MG tablet Take 1 tablet (5 mg total) by mouth daily.   No current facility-administered medications for this visit. (Other)   REVIEW OF SYSTEMS: ROS   Positive for: Endocrine, Cardiovascular, Eyes Negative for: Constitutional, Gastrointestinal, Neurological, Skin, Genitourinary, Musculoskeletal, HENT, Respiratory, Psychiatric, Allergic/Imm, Heme/Lymph Last edited by Etheleen Mayhew, COT on 06/26/2023  3:04 PM.      ALLERGIES Allergies  Allergen Reactions   Cephalexin Hives   Cephalexin Hives   Vancomycin Hives, Itching and Other (See Comments)    True Allergic Reaction -- Not "Red Man's Syndrome Onset of prominent red rash, severe itching/hives Not responsive to Benadryl and IV rate reduction. First seen pre-op for surgery on 06/05/18.   PAST MEDICAL HISTORY Past Medical History:  Diagnosis Date   Anxiety    Arthritis    Bilateral elbow joint pain    Chronic back pain 03/18/2018   DDD (degenerative disc disease), lumbar    Depression    Diabetes mellitus    dx 2010 type 2   Lumbar radiculopathy    Lumbar spine pain    Neurogenic claudication due to lumbar spinal stenosis    Neurogenic claudication due to lumbar spinal stenosis 05/10/2018   Pacemaker  Permanent pacemaker placement by Dr. Molly Maduro McQueen--10/21/2007--a [employer] Adapta--serial number #[id number]   Pain in joint of left shoulder    Post laminectomy syndrome 07/07/2020   Radiculopathy of lumbar region    Syncope 09/2007   2D Echo--History of frequent episodes of syncope beginning at 57 years of age--usually associated with some type of vagal issue but he does completely lose consciousness   Past Surgical History:  Procedure Laterality Date   ABDOMINAL EXPOSURE  09/08/2020   Procedure: ABDOMINAL EXPOSURE;  Surgeon: [name], MD;   Location: [facility] OR;  Service: Orthopedics;;   ANTERIOR LUMBAR FUSION N/A 09/08/2020   Procedure: Anterior lumbar interbody fusion L5-S1, posterior spinal fusion interbody L5-S1;  Surgeon: [name], MD;  Location: [facility] OR;  Service: Orthopedics;  Laterality: N/A;  ; move other case to follow   CARDIAC CATHETERIZATION Right 10/20/2007   COLONOSCOPY     HAND SURGERY     HARVEST BONE GRAFT  09/08/2020   Procedure: HARVEST ILIAC BONE GRAFT;  Surgeon: [name], MD;  Location: [facility] OR;  Service: Orthopedics;;   LUMBAR LAMINECTOMY/DECOMPRESSION MICRODISCECTOMY N/A 06/05/2018   Procedure: Lumbar decompression L4-S1;  Surgeon: [name], MD;  Location: [facility] OR;  Service: Orthopedics;  Laterality: N/A;  3 hrs   PACEMAKER INSERTION  10/21/2007   Dr Jenne Campus   SHOULDER SURGERY  1999   arthroscopy   TONSILLECTOMY     FAMILY HISTORY Family History  Problem Relation Age of Onset   Diabetes Sister        type 2   Diabetes Mother        type 2   Coronary artery disease Mother 67's   Hypertension Mother    Stroke Mother    SOCIAL HISTORY Social History   Tobacco Use   Smoking status: Former    Current packs/day: 0.00    Average packs/day: 1 pack/day for 30.0 years (30.0 ttl pk-yrs)    Types: Cigarettes    Start date: 10/16/1977    Quit date: 10/17/2007    Years since quitting: 15.7   Smokeless tobacco: Former    Types: Chew    Quit date: 2020   Tobacco comments:    occasionally uses chewing tobacco but not on a regular basic  Vaping Use   Vaping status: Never Used  Substance Use Topics   Alcohol use: No   Drug use: No       OPHTHALMIC EXAM: Base Eye Exam     Visual Acuity (Snellen - Linear)       Right Left   Dist [place] 20/25 +2 20/25 -2   Dist ph [place] 20/20 -3 20/20 -2         Tonometry (Tonopen, 3:10 PM)       Right Left   Pressure 18 18         Pupils       Pupils Dark Light Shape React APD   Right PERRL 3 2 Round Brisk None   Left PERRL 3 2 Round Brisk  None         Visual Fields       Left Right    Full Full         Extraocular Movement       Right Left    Full, Ortho Full, Ortho         Neuro/Psych     Oriented x3: Yes   Mood/Affect: Normal         Dilation     Both eyes:  2.5% Phenylephrine @ 3:10 PM           Slit Lamp and Fundus Exam     Slit Lamp Exam       Right Left   Lids/Lashes Dermatochalasis - upper lid Dermatochalasis - upper lid   Conjunctiva/Sclera White and quiet White and quiet   Cornea mild Arcus, mild tear film debris mild Arcus, mild tear film debris   Anterior Chamber Deep and quiet Deep and quiet   Iris Round and dilated, No NVI Round and moderately dilated, No NVI   Lens 2+ Nuclear sclerosis, 2+ Cortical cataract 2+ Nuclear sclerosis, 2+ Cortical cataract   Anterior Vitreous mild Vitreous syneresis mild Vitreous syneresis         Fundus Exam       Right Left   Disc Compact, Pink and Sharp Pink and Sharp, Compact   C/D Ratio 0.1 0.2   Macula Flat, Blunted foveal reflex, mild RPE mottling and clumping, No heme or edema Flat, good foveal reflex, mild RPE mottling and clumping, No heme or edema   Vessels Normal Normal   Periphery Attached, mild reticular degeneration, focal pavingstone inferiorly at 0600, no heme Attached, reticular degeneration, pigmented CR scar with atrophy at 0730, pigmented pavingstone inferiorly, No heme           IMAGING AND PROCEDURES  Imaging and Procedures for @TODAY @  OCT, Retina - OU - Both Eyes       Right Eye Quality was good. Central Foveal Thickness: 283. Progression has been stable. Findings include normal foveal contour, no IRF, no SRF, vitreomacular adhesion .   Left Eye Quality was good. Central Foveal Thickness: 289. Progression has been stable. Findings include normal foveal contour, no IRF, no SRF, vitreomacular adhesion .   Notes *Images captured and stored on drive  Diagnosis / Impression:  NFP, no IRF/SRF OU No DME  OU  Clinical management:  See below  Abbreviations: NFP - Normal foveal profile. CME - cystoid macular edema. PED - pigment epithelial detachment. IRF - intraretinal fluid. SRF - subretinal fluid. EZ - ellipsoid zone. ERM - epiretinal membrane. ORA - outer retinal atrophy. ORT - outer retinal tubulation. SRHM - subretinal hyper-reflective material            ASSESSMENT/PLAN:    ICD-10-CM   1. Diabetes mellitus type 2 without retinopathy (HCC)  E11.9 OCT, Retina - OU - Both Eyes    2. Current use of insulin (HCC)  Z79.4     3. Essential hypertension  I10     4. Hypertensive retinopathy of both eyes  H35.033     5. Combined forms of age-related cataract of both eyes  H25.813      1,2. Diabetes mellitus, type 2 without retinopathy OU  - A1c 7.2% on 08.19.24, 7.4% on 11.21.22, 9.0% on 07.12.22 from 7.4% in 03.11.20  - The incidence, risk factors for progression, natural history and treatment options for diabetic retinopathy were discussed with patient.    - The need for close monitoring of blood glucose, blood pressure, and serum lipids, avoiding cigarette or any type of tobacco, and the need for long term follow up was also discussed with patient.  - f/u in 1 year, sooner prn  3,4. Hypertensive retinopathy OU  - discussed importance of tight BP control  - monitor  5. Mixed form age related cataracts OU  - The symptoms of cataract, surgical options, and treatments and risks were discussed with patient.  - discussed diagnosis and progression  -  monitor  Ophthalmic Meds Ordered this visit:  No orders of the defined types were placed in this encounter.     Return in about 1 year (around 06/25/2024) for f/u DM exam, DFE, OCT.  There are no Patient Instructions on file for this visit.  This document serves as a record of services personally performed by Karie Chimera, MD, PhD. It was created on their behalf by De Blanch, an ophthalmic technician. The creation of this  record is the provider's dictation and/or activities during the visit.    Electronically signed by: De Blanch, OA, 06/28/23  11:44 PM  This document serves as a record of services personally performed by Karie Chimera, MD, PhD. It was created on their behalf by Glee Arvin. Manson Passey, OA an ophthalmic technician. The creation of this record is the provider's dictation and/or activities during the visit.    Electronically signed by: Glee Arvin. Manson Passey, OA 06/28/23 11:44 PM  Karie Chimera, M.D., Ph.D. Diseases & Surgery of the Retina and Vitreous [facility]   I have reviewed the above documentation for accuracy and completeness, and I agree with the above. Karie Chimera, M.D., Ph.D. 06/28/23 11:44 PM  Abbreviations: M myopia (nearsighted); A astigmatism; H hyperopia (farsighted); P presbyopia; Mrx spectacle prescription;  CTL contact lenses; OD right eye; OS left eye; OU both eyes  XT exotropia; ET esotropia; PEK punctate epithelial keratitis; PEE punctate epithelial erosions; DES dry eye syndrome; MGD meibomian gland dysfunction; ATs artificial tears; PFAT's preservative free artificial tears; NSC nuclear sclerotic cataract; PSC posterior subcapsular cataract; ERM epi-retinal membrane; PVD posterior vitreous detachment; RD retinal detachment; DM diabetes mellitus; DR diabetic retinopathy; NPDR non-proliferative diabetic retinopathy; PDR proliferative diabetic retinopathy; CSME clinically significant macular edema; DME diabetic macular edema; dbh dot blot hemorrhages; CWS cotton wool spot; POAG primary open angle glaucoma; C/D cup-to-disc ratio; HVF humphrey visual field; GVF goldmann visual field; OCT optical coherence tomography; IOP intraocular pressure; BRVO Branch retinal vein occlusion; CRVO central retinal vein occlusion; CRAO central retinal artery occlusion; BRAO branch retinal artery occlusion; RT retinal tear; SB scleral buckle; PPV pars plana vitrectomy; VH  Vitreous hemorrhage; PRP panretinal laser photocoagulation; IVK intravitreal kenalog; VMT vitreomacular traction; MH Macular hole;  NVD neovascularization of the disc; NVE neovascularization elsewhere; AREDS age related eye disease study; ARMD age related macular degeneration; POAG primary open angle glaucoma; EBMD epithelial/anterior basement membrane dystrophy; ACIOL anterior chamber intraocular lens; IOL intraocular lens; PCIOL posterior chamber intraocular lens; Phaco/IOL phacoemulsification with intraocular lens placement; PRK photorefractive keratectomy; LASIK laser assisted in situ keratomileusis; HTN hypertension; DM diabetes mellitus; COPD chronic obstructive pulmonary disease

## 2023-06-26 ENCOUNTER — Encounter (INDEPENDENT_AMBULATORY_CARE_PROVIDER_SITE_OTHER): Payer: Self-pay | Admitting: Ophthalmology

## 2023-06-26 ENCOUNTER — Ambulatory Visit (INDEPENDENT_AMBULATORY_CARE_PROVIDER_SITE_OTHER): Payer: BC Managed Care – PPO | Admitting: Ophthalmology

## 2023-06-26 DIAGNOSIS — Z794 Long term (current) use of insulin: Secondary | ICD-10-CM | POA: Diagnosis not present

## 2023-06-26 DIAGNOSIS — E119 Type 2 diabetes mellitus without complications: Secondary | ICD-10-CM

## 2023-06-26 DIAGNOSIS — H25813 Combined forms of age-related cataract, bilateral: Secondary | ICD-10-CM

## 2023-06-26 DIAGNOSIS — I10 Essential (primary) hypertension: Secondary | ICD-10-CM | POA: Diagnosis not present

## 2023-06-26 DIAGNOSIS — H35033 Hypertensive retinopathy, bilateral: Secondary | ICD-10-CM | POA: Diagnosis not present

## 2023-06-28 ENCOUNTER — Encounter (INDEPENDENT_AMBULATORY_CARE_PROVIDER_SITE_OTHER): Payer: Self-pay | Admitting: Ophthalmology

## 2023-08-09 ENCOUNTER — Ambulatory Visit (INDEPENDENT_AMBULATORY_CARE_PROVIDER_SITE_OTHER): Payer: BC Managed Care – PPO

## 2023-08-09 DIAGNOSIS — R001 Bradycardia, unspecified: Secondary | ICD-10-CM | POA: Diagnosis not present

## 2023-08-15 ENCOUNTER — Telehealth: Payer: Self-pay

## 2023-08-15 LAB — CUP PACEART REMOTE DEVICE CHECK
Battery Impedance: 6952 Ohm
Battery Remaining Longevity: 1 mo — CL
Battery Voltage: 2.61 V
Brady Statistic AP VP Percent: 0 %
Brady Statistic AP VS Percent: 7 %
Brady Statistic AS VP Percent: 0 %
Brady Statistic AS VS Percent: 93 %
Date Time Interrogation Session: 20241120134238
Implantable Lead Connection Status: 753985
Implantable Lead Connection Status: 753985
Implantable Lead Implant Date: 20090127
Implantable Lead Implant Date: 20090127
Implantable Lead Location: 753859
Implantable Lead Location: 753860
Implantable Lead Model: 5076
Implantable Lead Model: 5076
Implantable Pulse Generator Implant Date: 20090127
Lead Channel Impedance Value: 461 Ohm
Lead Channel Impedance Value: 567 Ohm
Lead Channel Pacing Threshold Amplitude: 0.5 V
Lead Channel Pacing Threshold Amplitude: 0.75 V
Lead Channel Pacing Threshold Pulse Width: 0.4 ms
Lead Channel Pacing Threshold Pulse Width: 0.4 ms
Lead Channel Setting Pacing Amplitude: 2 V
Lead Channel Setting Pacing Amplitude: 2.5 V
Lead Channel Setting Pacing Pulse Width: 0.4 ms
Lead Channel Setting Sensing Sensitivity: 5.6 mV
Zone Setting Status: 755011
Zone Setting Status: 755011

## 2023-08-15 NOTE — Telephone Encounter (Signed)
Scheduled remote reviewed. Normal device function.   Battery estimated <60mo - route to triage for IFU 1 brief VHR, atrial driven Next remote to be determined LA, CVRS

## 2023-08-16 NOTE — Telephone Encounter (Signed)
Left message saying that the pt needs to come in for an appt with Dr Royann Shivers; we have some openings on 09/12/23. Left call back number. (Also sent a mychart message)  (09/12/23 is a device day- have openings)

## 2023-08-21 NOTE — Progress Notes (Signed)
Remote pacemaker transmission.   

## 2023-08-28 NOTE — Telephone Encounter (Addendum)
Called to get patient scheduled with Dr Royann Shivers- have openings on 12/19. Unable to leave message on his wife's phone- no option and the message was in spanish.  Letter mailed to patient

## 2023-09-17 ENCOUNTER — Other Ambulatory Visit: Payer: Self-pay

## 2023-09-17 ENCOUNTER — Emergency Department: Payer: BC Managed Care – PPO

## 2023-09-17 ENCOUNTER — Emergency Department
Admission: EM | Admit: 2023-09-17 | Discharge: 2023-09-17 | Disposition: A | Discharge status: Home / Self Care | Payer: BC Managed Care – PPO | Attending: Emergency Medicine | Admitting: Emergency Medicine

## 2023-09-17 DIAGNOSIS — Z950 Presence of cardiac pacemaker: Secondary | ICD-10-CM | POA: Insufficient documentation

## 2023-09-17 DIAGNOSIS — W1809XA Striking against other object with subsequent fall, initial encounter: Secondary | ICD-10-CM | POA: Insufficient documentation

## 2023-09-17 DIAGNOSIS — M533 Sacrococcygeal disorders, not elsewhere classified: Secondary | ICD-10-CM | POA: Diagnosis present

## 2023-09-17 DIAGNOSIS — E119 Type 2 diabetes mellitus without complications: Secondary | ICD-10-CM | POA: Insufficient documentation

## 2023-09-17 DIAGNOSIS — Y92002 Bathroom of unspecified non-institutional (private) residence single-family (private) house as the place of occurrence of the external cause: Secondary | ICD-10-CM | POA: Insufficient documentation

## 2023-09-17 DIAGNOSIS — S3210XA Unspecified fracture of sacrum, initial encounter for closed fracture: Secondary | ICD-10-CM | POA: Insufficient documentation

## 2023-09-17 MED ORDER — OXYCODONE HCL 5 MG PO TABS
5.0000 mg | ORAL_TABLET | Freq: Once | ORAL | Status: AC
  Administered 2023-09-17: 5 mg via ORAL
  Filled 2023-09-17: qty 1

## 2023-09-17 MED ORDER — ACETAMINOPHEN 325 MG PO TABS
650.0000 mg | ORAL_TABLET | Freq: Once | ORAL | Status: AC
  Administered 2023-09-17: 650 mg via ORAL
  Filled 2023-09-17: qty 2

## 2023-09-17 MED ORDER — KETOROLAC TROMETHAMINE 30 MG/ML IJ SOLN
30.0000 mg | Freq: Once | INTRAMUSCULAR | Status: AC
  Administered 2023-09-17: 30 mg via INTRAMUSCULAR
  Filled 2023-09-17: qty 1

## 2023-09-17 NOTE — ED Triage Notes (Signed)
Pt sts that he fell this morning on the tile floor in the bathroom on his buttocks. Pt sts that he doesn't know if he broke his tail bone or not. Pt sts that he has been sitting on ice which has helped.

## 2023-09-17 NOTE — Discharge Instructions (Signed)
You have a broken sacrum/coccyx (tailbone) which should heal with time.  You can use a donut pillow that may help with pain as you sit.  You can call Dr. Leron Croak for follow-up appointment.  Or, you may call your previous orthopedist for follow-up as well.  Take acetaminophen 650 mg and ibuprofen 400 mg every 6 hours for pain.  Take with food. Take oxycodone for severe pain as needed.  Thank you for choosing [place] for your health care today!  Please see your primary doctor this week for a follow up appointment.   If you have any new, worsening, or unexpected symptoms call your doctor right away or come back to the emergency department for reevaluation.  It was my pleasure to care for you today.   Daneil Dan Modesto Charon, MD

## 2023-09-17 NOTE — ED Provider Notes (Signed)
[facility] Provider Note    Event Date/Time   First MD Initiated Contact with Patient 09/17/23 1811     (approximate)   History   Back Pain   HPI  Theodore [name] is a 57 y.o. male   Past medical history of chronic back pain, vasovagal syncope, pacemaker, DM, prior lumbar fusion surgery, here with injury to tailbone.  He was in his bathroom today when he had some muscle cramps to his legs, and then struck his tailbone against the sink or another appliance, and due to the pain he passed out and fell forward.  Brief loss of consciousness and awoke with tailbone pain only.  He has a history of vasovagal syncope that is associated with painful reactions.  He denied any chest pain, palpitations, shortness of breath associated with his syncopal episode and his syncopal episode is longstanding and not unusual for him.  He denies any motor or sensory changes, numbness, incontinence.  He denies any headache.  He has a small abrasion to his left cheekbone but otherwise denied any significant head injury.  He has no other pain elsewhere in his body.   Independent Historian contributed to assessment above: His wife is at bedside to corroborate information past medical history above  External Medical Documents Reviewed: Surgery note for anterior lumbar fusion in 2021      Physical Exam   Triage Vital Signs: ED Triage Vitals  Encounter Vitals Group     BP 09/17/23 1702 (!) 140/77     Systolic BP Percentile --      Diastolic BP Percentile --      Pulse Rate 09/17/23 1702 94     Resp 09/17/23 1702 17     Temp 09/17/23 1702 98 F (36.7 C)     Temp Source 09/17/23 1702 Oral     SpO2 09/17/23 1702 98 %     Weight 09/17/23 1703 175 lb (79.4 kg)     Height 09/17/23 1703 5\' 8"  (1.727 m)     Head Circumference --      Peak Flow --      Pain Score 09/17/23 1703 0     Pain Loc --      Pain Education --      Exclude from Growth Chart --     Most recent  vital signs: Vitals:   09/17/23 1702  BP: (!) 140/77  Pulse: 94  Resp: 17  Temp: 98 F (36.7 C)  SpO2: 98%    General: Awake, no distress.  CV:  Good peripheral perfusion.  Resp:  Normal effort.  Abd:  No distention.  Other:  Awake alert comfortable patient with normal vital signs.  He has some sacral/coccyx area tenderness to palpation.  He has no neurologic deficits including motor function of bilateral lower extremities, and sensation is tact to the perineal area and bilateral lower extremities.  He has no obvious signs of head trauma and his neck is supple with full range of motion.   ED Results / Procedures / Treatments   Labs (all labs ordered are listed, but only abnormal results are displayed) Labs Reviewed - No data to display RADIOLOGY I independently reviewed and interpreted sacral x-ray and see a fracture I also reviewed radiologist's formal read.   PROCEDURES:  Critical Care performed: No  Procedures   MEDICATIONS ORDERED IN ED: Medications  ketorolac (TORADOL) 30 MG/ML injection 30 mg (has no administration in time range)  acetaminophen (TYLENOL) tablet 650 mg (  has no administration in time range)  oxyCODONE (Oxy IR/ROXICODONE) immediate release tablet 5 mg (has no administration in time range)    External physician / consultants:  I spoke with Dr. Leron Croak regarding care plan for this patient.   IMPRESSION / MDM / ASSESSMENT AND PLAN / ED COURSE  I reviewed the triage vital signs and the nursing notes.                                Patient's presentation is most consistent with acute presentation with potential threat to life or bodily function.  Differential diagnosis includes, but is not limited to, sacral or coccyx fracture, nerve injury, vasovagal syncope   MDM:    This is a patient with a sacrococcygeal fracture minimally displaced with no neurologic changes.   Consulted with Dr. Leron Croak of orthopedics to review x-ray imaging,  clinical presentation and recommendations   He struck his tailbone which led to pain which led to a vasovagal syncope that he has experienced many times in the past due to painful reaction.  I doubt this is a dysrhythmia or cardiac syncope.    He had a minor head injury, doubt ICH or skull fracture, defer CT scan patient is in agreement that he does not want imaging of his head at this time.     -- Recommendations include weightbearing as tolerated, pain medications, and will give information for orthopedics follow-up.          FINAL CLINICAL IMPRESSION(S) / ED DIAGNOSES   Final diagnoses:  Closed fracture of sacrum, unspecified portion of sacrum, initial encounter (HCC)     Rx / DC Orders   ED Discharge Orders     None        Note:  This document was prepared using Dragon voice recognition software and may include unintentional dictation errors.    Pilar Jarvis, MD 09/17/23 [id number]

## 2023-09-19 ENCOUNTER — Telehealth: Payer: Self-pay | Admitting: Cardiovascular Disease

## 2023-09-19 NOTE — Telephone Encounter (Signed)
.  SYNCOPECHMG   Pt c/o Syncope: STAT if syncope occurred within 24 hours and pt complains of lightheadedness.   High Priority if episode of passing out, completely, today or in last 24 hours   1. Did you pass out today? No    2. When is the last time you passed out? 09/17/23   3. Has this occurred multiple times? yes  4. Did you have any symptoms prior to passing out? Nauseated feeling  5. Did you fall? If so, are you on a blood thinner? Yes; no bloodthinner  Please send text message to phone number since patient is at work

## 2023-09-19 NOTE — Telephone Encounter (Signed)
Neurally mediated syncope (vasovagal syncope) is always due to a combination of 2 different mechanisms: Cardioinhibitory (sudden excessive slowing of the heart rate) and vasodepressor (sudden excessive dilation of the blood vessels). Although the pacemaker is very effective at preventing any slowing of the heart rate, it cannot unfortunately help with the sudden drop in blood pressure due to a strong vasodepressor response.  Usually, it does provide a longer time from the onset of warning symptoms to the onset of full-blown syncope, which hopefully allows the patient to get to safety and not have serious injuries.  I am so sorry to hear that Theodore [name] has had a serious injury from his fall.  At his last device check in the office and at the time of his last pacemaker download 08/14/22, the device was functioning normally.  I suspect he passed out because of a sudden drop in blood pressure. I think he is actually due for a yearly in office check, but I suspect that the pacemaker is still working okay.

## 2023-09-19 NOTE — Telephone Encounter (Signed)
Attempted to call patient x2, call goes directly to voicemail left message requesting a call back.

## 2023-09-19 NOTE — Telephone Encounter (Signed)
Patient identification verified by 2 forms. Marilynn Rail, RN    Called and spoke to patients wife [name]  Relayed provider message below  Patient scheduled for OV 1/14 at 3:10pm with Np Monge  [name] agrees, no questions at this time

## 2023-09-19 NOTE — Telephone Encounter (Signed)
Patient identification verified by 2 forms. Theodore Rail, RN    Called and spoke to patient wife [name]  [name] states:   -Patient has a pacemaker in place   -pacemaker is supposed to stop syncope response to pain   -patient had two episodes of LOC on 12/24  -the first LOC was after he had pain in his leg   -second LOC was after fracturing sacrum due to first LOC   -patient presented to ED on 12/24  -is concerned that pacemaker is not working since patient had two LOC response to pain  Informed [name] message sent to Dr. Royann Shivers for input/advisement [name] has no further questions at this time

## 2023-09-19 NOTE — Telephone Encounter (Signed)
Follow Up:    Wife is returning call from today.

## 2023-09-20 ENCOUNTER — Telehealth: Payer: Self-pay | Admitting: Emergency Medicine

## 2023-09-20 MED ORDER — OXYCODONE HCL 5 MG PO TABS: 5.0000 mg | ORAL_TABLET | Freq: Four times a day (QID) | ORAL | 0 refills | Status: DC | PRN

## 2023-09-20 NOTE — Telephone Encounter (Signed)
Patient called stating narcotic prescription was not sent to pharmacy. Per Dr. Maurice March note patient suffered an acute fracture, it appears his plan was to put patient on pain medication. Will send prescription to patient's pharmacy.

## 2023-10-08 ENCOUNTER — Ambulatory Visit: Payer: BC Managed Care – PPO | Admitting: Nurse Practitioner

## 2023-10-17 ENCOUNTER — Ambulatory Visit: Payer: BC Managed Care – PPO

## 2023-10-17 DIAGNOSIS — R001 Bradycardia, unspecified: Secondary | ICD-10-CM

## 2023-10-23 ENCOUNTER — Telehealth: Payer: Self-pay

## 2023-10-23 ENCOUNTER — Encounter: Payer: Self-pay | Admitting: Cardiovascular Disease

## 2023-10-23 LAB — CUP PACEART REMOTE DEVICE CHECK
Battery Impedance: 9900 Ohm
Battery Voltage: 2.59 V
Brady Statistic RV Percent Paced: 0 %
Date Time Interrogation Session: 20250128210749
Implantable Lead Connection Status: 753985
Implantable Lead Connection Status: 753985
Implantable Lead Implant Date: 20090127
Implantable Lead Implant Date: 20090127
Implantable Lead Location: 753859
Implantable Lead Location: 753860
Implantable Lead Model: 5076
Implantable Lead Model: 5076
Implantable Pulse Generator Implant Date: 20090127
Lead Channel Impedance Value: 518 Ohm
Lead Channel Impedance Value: 67 Ohm
Lead Channel Setting Pacing Amplitude: 2.5 V
Lead Channel Setting Pacing Pulse Width: 0.4 ms
Lead Channel Setting Sensing Sensitivity: 5.6 mV
Zone Setting Status: 755011
Zone Setting Status: 755011

## 2023-10-23 NOTE — Telephone Encounter (Signed)
Monthly battery check.  RRT reached 12/24 - route to triage Normal device function. No new alerts. Follow up as scheduled monthly LA, CVRS

## 2023-10-23 NOTE — Telephone Encounter (Signed)
Thanks! Florentina Addison, can we get him in sometime in February or first couple of weeks in March, please? May need to overbook. Would plan on a gen change Feb 20 or March 13 or March 20.

## 2023-10-23 NOTE — Telephone Encounter (Signed)
Sees Croitoru on 10/31/23.

## 2023-10-23 NOTE — Telephone Encounter (Signed)
Lm to notify patient. Forwarding to Dr. Salena Saner and his nurse to alert and added to appt notes with Dr. Salena Saner on 2/6 that device has reached ERI on 09/17/23.

## 2023-10-29 NOTE — Telephone Encounter (Signed)
Patient has a follow up appointment 10/31/23

## 2023-10-31 ENCOUNTER — Ambulatory Visit: Payer: BC Managed Care – PPO | Admitting: Cardiovascular Disease

## 2023-11-01 ENCOUNTER — Ambulatory Visit: Payer: BC Managed Care – PPO | Admitting: Adult Health

## 2023-11-18 ENCOUNTER — Ambulatory Visit (INDEPENDENT_AMBULATORY_CARE_PROVIDER_SITE_OTHER): Payer: BC Managed Care – PPO

## 2023-11-18 DIAGNOSIS — R001 Bradycardia, unspecified: Secondary | ICD-10-CM

## 2023-11-21 LAB — CUP PACEART REMOTE DEVICE CHECK
Battery Impedance: 10707 Ohm
Battery Voltage: 2.57 V
Brady Statistic RV Percent Paced: 0 %
Date Time Interrogation Session: 20250226194939
Implantable Lead Connection Status: 753985
Implantable Lead Connection Status: 753985
Implantable Lead Implant Date: 20090127
Implantable Lead Implant Date: 20090127
Implantable Lead Location: 753859
Implantable Lead Location: 753860
Implantable Lead Model: 5076
Implantable Lead Model: 5076
Implantable Pulse Generator Implant Date: 20090127
Lead Channel Impedance Value: 555 Ohm
Lead Channel Impedance Value: 67 Ohm
Lead Channel Setting Pacing Amplitude: 2.5 V
Lead Channel Setting Pacing Pulse Width: 0.4 ms
Lead Channel Setting Sensing Sensitivity: 5.6 mV
Zone Setting Status: 755011
Zone Setting Status: 755011

## 2023-11-28 NOTE — Addendum Note (Signed)
 Addended by: Elease Etienne A on: 11/28/2023 02:54 PM   Modules accepted: Orders, Level of Service

## 2023-11-28 NOTE — Progress Notes (Signed)
 Remote pacemaker transmission.

## 2023-12-01 ENCOUNTER — Encounter: Payer: Self-pay | Admitting: Cardiovascular Disease

## 2023-12-25 ENCOUNTER — Encounter: Payer: Self-pay | Admitting: Cardiovascular Disease

## 2023-12-25 NOTE — Addendum Note (Signed)
 Addended by: Geralyn Flash D on: 12/25/2023 01:15 PM   Modules accepted: Orders, Level of Service

## 2023-12-25 NOTE — Progress Notes (Signed)
 Remote pacemaker transmission.

## 2023-12-26 ENCOUNTER — Ambulatory Visit: Payer: BC Managed Care – PPO | Attending: Cardiovascular Disease | Admitting: Cardiovascular Disease

## 2023-12-26 ENCOUNTER — Encounter: Payer: Self-pay | Admitting: Cardiovascular Disease

## 2023-12-26 VITALS — BP 110/76 | HR 69 | Ht 68.0 in | Wt 182.4 lb

## 2023-12-26 DIAGNOSIS — E10649 Type 1 diabetes mellitus with hypoglycemia without coma: Secondary | ICD-10-CM | POA: Diagnosis not present

## 2023-12-26 DIAGNOSIS — Z45010 Encounter for checking and testing of cardiac pacemaker pulse generator [battery]: Secondary | ICD-10-CM | POA: Diagnosis not present

## 2023-12-26 DIAGNOSIS — R55 Syncope and collapse: Secondary | ICD-10-CM

## 2023-12-26 DIAGNOSIS — I480 Paroxysmal atrial fibrillation: Secondary | ICD-10-CM | POA: Diagnosis not present

## 2023-12-26 DIAGNOSIS — E7800 Pure hypercholesterolemia, unspecified: Secondary | ICD-10-CM

## 2023-12-26 NOTE — Patient Instructions (Signed)
 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 Rachelle Hora Croitoru.  1. Pre procedure Lab testing: Labs from 12/11/23   2. Please arrive at the [facility] (Main Entrance A) at [facility]: [address] [place], [place] [address] at 12:30 PM (This time is 1 hour(s) before your procedure to ensure your preparation).   Free valet parking service is available. You will check in at ADMITTING. The support person will be asked to wait in the waiting room.  It is OK to have someone drop you off and come back when you are ready to be discharged.          Special note: Every effort is made to have your procedure done on time. Please understand that emergencies sometimes delay  scheduled procedures.  3. You may eat breakfast. Only water after breakfast.    4.  Medication instructions:   You may have breakfast- take 10 units of Lantus and 6 units of Novolog with breakfast. Nothing to eat after this time. You may drink water      !!IF ANY NEW MEDICATIONS ARE STARTED AFTER TODAY, PLEASE NOTIFY YOUR PROVIDER AS SOON AS POSSIBLE!!  FYI: Medications such as Semaglutide (Ozempic, [place]), Tirzepatide (Mounjaro, Zepbound), Dulaglutide (Trulicity), etc ("GLP1 agonists") AND Canagliflozin (Invokana), Dapagliflozin (Farxiga), Empagliflozin (Jardiance), Ertugliflozin (Steglatro), Bexagliflozin [employer]) or any combination with one of these drugs such as Invokamet (Canagliflozin/Metformin), Synjardy (Empagliflozin/Metformin), etc ("SGLT2 inhibitors") must be held around the time of a procedure. This is not a comprehensive list of all of these drugs. Please review all of your medications and talk to your provider if you take any one  of these. If you are not sure, ask your provider.   5.  The night before your procedure and the morning of your procedure scrub your neck/chest with CHG surgical scrub.  See instruction letter.  6. Plan to go home the same day, you will only stay overnight if medically necessary. 7. You MUST have a responsible adult to drive you home. 8. An adult MUST be with you the first 24 hours after you arrive home. 9. Bring a current list of your medications, and the last time and date medication taken. 10. Bring ID and current insurance cards. 11. Please wear clothes that are easy to get on and off and wear slip-on shoes.    You will follow up with the [facility] Device clinic 10-14 days after your procedure.  You will follow up with Dr. Rachelle Hora Croitoru 91 days after your procedure.  These appointments will be made for you.   * If you have ANY questions after you get home, please call the office at [phone] or send a MyChart message.  FYI: For your safety, and to allow [place] to monitor your vital signs accurately during the surgery/procedure we request that if you have artificial nails, gel coating, SNS etc. Please have those removed prior to your surgery/procedure. Not having the nail coverings /polish removed may result in cancellation or delay of your surgery/procedure.    [facility] - Preparing For Surgery    Before surgery, you can play an important role. Because skin is not sterile, your skin needs to be as free of germs as possible.  You can reduce the number of germs on your skin by washing with CHG (chlorahexidine gluconate) Soap before surgery.  CHG is an antiseptic cleaner which kills germs and bonds with the skin to continue killing germs even after washing.  Please do not use if you have an allergy to CHG or antibacterial soaps.  If your skin becomes reddened/irritated stop using the CHG.   Do not shave (including legs and underarms) for at least 48 hours prior to first CHG shower.  It is  OK to shave your face.  Please follow these instructions carefully:  1.  Shower the night before surgery and the morning of surgery with CHG.  2.  If you choose to wash your hair, wash your hair first as usual with your normal shampoo.  3.  After you shampoo, rinse your hair and body thoroughly to remove the shampoo.  4.  Use CHG as you would any other liquid soap.  You can apply CHG directly to the skin and wash gently with a clean washcloth. 5.  Apply the CHG Soap to your body ONLY FROM THE NECK DOWN.  Do not use on open wounds or open sores.  Avoid contact with your eyes, ears, mouth and genitals (private parts).  Wash genitals (private parts) with your normal soap.  6.  Wash thoroughly, paying special attention to the area where your surgery will be performed.  7.  Thoroughly rinse your body with warm water from the neck down.   8.  DO NOT shower/wash with your normal soap after using and rinsing off the CHG soap.  9.  Pat yourself dry with a clean towel.           10.  Wear clean pajamas.           11.  Place clean sheets on your bed the night of your first shower and do not sleep with pets.  Day of Surgery: Do not apply any deodorants/lotions.  Please wear clean clothes to the hospital/surgery center.   Follow-Up: At [facility], you and your health needs are our priority.  As part of our continuing mission to provide you with exceptional heart care, our providers are all part of one team.  This team includes your primary Cardiologist (physician) and Advanced Practice Providers or APPs (Physician Assistants and Nurse Practitioners) who all work together to provide you with the care you need, when you need it.  Your next appointment:   Will be scheduled after procedure  Provider:   Thurmon Fair, MD     We recommend signing up for the patient portal called "MyChart".  Sign up information is provided on this After Visit Summary.  MyChart is used to connect with patients for  Virtual Visits (Telemedicine).  Patients are able to view lab/test results, encounter notes, upcoming appointments, etc.  Non-urgent messages can be sent to your provider as well.   To learn more about what you can do with MyChart, go to [email or web address].        1st Floor: - Lobby - Registration  - Pharmacy  - Lab - Cafe  2nd Floor: - PV Lab - Diagnostic Testing (echo, CT, nuclear med)  3rd Floor: - Vacant  4th Floor: - TCTS (cardiothoracic surgery) - AFib Clinic - Structural Heart Clinic - Vascular Surgery  - Vascular Ultrasound  5th Floor: - [facility] (general and EP) - Clinical Pharmacy for coumadin, hypertension, lipid, weight-loss medications, and med management appointments    Valet parking  services will be available as well.

## 2023-12-26 NOTE — Progress Notes (Signed)
 Patient ID: Theodore [name], male   DOB: June 25, 1966, 58 y.o.   MRN: [id number]    Cardiology Office Note    Date:  12/26/2023   ID:  Theodore [name], DOB July 03, 1966, MRN [id number]  PCP:  [facility]  Cardiologist:   Thurmon Fair, MD   Chief Complaint  Patient presents with   Pacemaker Problem    History of Present Illness:  Theodore [name] is a 58 y.o. male with insulin-dependent diabetes mellitus and previous history of numerous episodes of syncope associated with prolonged sinus pauses, which completely resolved following implantation of a dual-chamber permanent pacemaker in 2009. He probably had neurally mediated syncope.  His device recorded a single episode of paroxysmal atrial fibrillation in December 2019 (around that time his mother had a stroke that was eventually fatal).  The arrhythmia lasted for over 19 hours and was not symptomatic and has not recurred since then.  His pacemaker has reached recommended replacement trigger.  He is here to discuss generator change out.  He had a syncopal event in the bathroom in late December and injured his coccyx.  He has recovered well from that.  We brought him back to the office to reprogram his pacemaker with more aggressive sudden bradycardia response.  It looks like not long after that his device reached RRT.  The device has switched to VVI mode at 65 bpm and cannot be reprogrammed.  The patient specifically denies any chest pain at rest or with exertion, dyspnea at rest or with exertion, orthopnea, paroxysmal nocturnal dyspnea, syncope, palpitations, focal neurological deficits, intermittent claudication, lower extremity edema, unexplained weight gain, cough, hemoptysis or wheezing.   Glycemic control has improved and his most recent hemoglobin A1c was 6.9%, the first time it has been less than 7% in several years.  He has normal renal parameters and electrolytes and normal liver function tests on labs performed  12/11/2023. Same day he had a lipid profile showing HDL 42 and LDL 81, normal triglycerides.  His pacemaker is a Medtronic Adapta device implanted in 2009 with Medtronic [id number] atrial and ventricular leads.  He almost never requires pacing (usually around 7% atrial pacing and no ventricular pacing).  When last fully checked, the leads have excellent parameters: Atrial lead capture threshold 0.5 V at 0.4 ms, sensed P waves greater than 2.8 mV, atrial lead impedance 461 ohms, ventricular lead threshold 0.75 V at 0.4 ms, R waves greater than 16 mV, ventricular lead impedance 567 ohms.  Theodore [name]'s triggers for syncope seem to be consistently related to the observation or discussion of health problems and medical procedures. For example he once passed out watching a person wearing oxygen mask and another time passed out discussing a newly diagnosed lung cancer in his uncle. His son has syncope associated with rapid palpitations, probably also neurally mediated.  He has insulin requiring type 2 diabetes mellitus and systemic hypertension both of which appear to be well treated. He is minimally overweight. He had an echocardiogram in 2009 that was a completely normal study. At that time he also had cardiac catheterization that showed normal coronary arteries. 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  Past Medical History:  Diagnosis Date   Anxiety    Arthritis    Bilateral elbow joint pain    Chronic back pain 03/18/2018   DDD (degenerative disc disease), lumbar    Depression    Diabetes mellitus    dx  2010 type 2   Lumbar radiculopathy    Lumbar spine pain    Neurogenic claudication due to lumbar spinal stenosis    Neurogenic claudication due to lumbar spinal stenosis 05/10/2018   Pacemaker    Permanent pacemaker placement by Dr. Molly Maduro McQueen--10/21/2007--a Medtronic Adapta--serial number #[id number]   Pain in joint of left shoulder     Post laminectomy syndrome 07/07/2020   Radiculopathy of lumbar region    Syncope 09/2007   2D Echo--History of frequent episodes of syncope beginning at 58 years of age--usually associated with some type of vagal issue but he does completely lose consciousness    Past Surgical History:  Procedure Laterality Date   ABDOMINAL EXPOSURE  09/08/2020   Procedure: ABDOMINAL EXPOSURE;  Surgeon: [name], MD;  Location: [facility] OR;  Service: Orthopedics;;   ANTERIOR LUMBAR FUSION N/A 09/08/2020   Procedure: Anterior lumbar interbody fusion L5-S1, posterior spinal fusion interbody L5-S1;  Surgeon: [name], MD;  Location: [facility] OR;  Service: Orthopedics;  Laterality: N/A;  ; move other case to follow   CARDIAC CATHETERIZATION Right 10/20/2007   COLONOSCOPY     HAND SURGERY     HARVEST BONE GRAFT  09/08/2020   Procedure: HARVEST ILIAC BONE GRAFT;  Surgeon: [name], MD;  Location: [facility] OR;  Service: Orthopedics;;   LUMBAR LAMINECTOMY/DECOMPRESSION MICRODISCECTOMY N/A 06/05/2018   Procedure: Lumbar decompression L4-S1;  Surgeon: [name], MD;  Location: [facility] OR;  Service: Orthopedics;  Laterality: N/A;  3 hrs   PACEMAKER INSERTION  10/21/2007   Dr Jenne Campus   SHOULDER SURGERY  1999   arthroscopy   TONSILLECTOMY      Current Medications: Outpatient Medications Prior to Visit  Medication Sig Dispense Refill   atorvastatin (LIPITOR) 10 MG tablet Take 10 mg by mouth daily.     Insulin Aspart FlexPen (NOVOLOG) 100 UNIT/ML Inject 6 Units into the skin 3 (three) times daily with meals.     Insulin Glargine Solostar (LANTUS) 100 UNIT/ML Solostar Pen Inject 24 Units into the skin daily in the afternoon.     Insulin Pen Needle (B-D UF III MINI PEN NEEDLES) 31G X 5 MM MISC BD Ultra-Fine Short Pen Needle 31 gauge x 5/16"  Use with Basaglar Kwikpen 200 each 1   lisinopril (ZESTRIL) 5 MG tablet Take 1 tablet (5 mg total) by mouth daily. 90 tablet 1   ACCU-CHEK AVIVA PLUS test strip Check  blood sugars twice a day. Dx: E11.59, Z79.4 (Patient not taking: Reported on 12/26/2023) 100 each 2   insulin aspart (NOVOLOG) 100 UNIT/ML injection Inject 2 Units into the skin 3 (three) times daily with meals. (Patient not taking: Reported on 12/26/2023) 10 mL 11   oxyCODONE (ROXICODONE) 5 MG immediate release tablet Take 1 tablet (5 mg total) by mouth every 6 (six) hours as needed for severe pain (pain score 7-10). (Patient not taking: Reported on 12/26/2023) 15 tablet 0   No facility-administered medications prior to visit.     Allergies:   Cephalexin, Cephalexin, and Vancomycin   Family History:  The patient's family history includes Coronary artery disease (age of onset: 21's) in his mother; Diabetes in his mother and sister; Hypertension in his mother; Stroke in his mother.   ROS:   Please see the history of present illness.    ROS All other systems reviewed and are negative.   PHYSICAL EXAM:   VS:  BP 110/76 (BP Location: Left Arm, Patient Position: Sitting, Cuff Size: Normal)   Pulse 69  Ht 5\' 8"  (1.727 m)   Wt 182 lb 6.4 oz (82.7 kg)   SpO2 94%   BMI 27.73 kg/m      General: Alert, oriented x3, no distress, healthy pacemaker site Head: no evidence of trauma, PERRL, EOMI, no exophtalmos or lid lag, no myxedema, no xanthelasma; normal ears, nose and oropharynx Neck: normal jugular venous pulsations and no hepatojugular reflux; brisk carotid pulses without delay and no carotid bruits Chest: clear to auscultation, no signs of consolidation by percussion or palpation, normal fremitus, symmetrical and full respiratory excursions Cardiovascular: normal position and quality of the apical impulse, regular rhythm, normal first and second heart sounds, no murmurs, rubs or gallops Abdomen: no tenderness or distention, no masses by palpation, no abnormal pulsatility or arterial bruits, normal bowel sounds, no hepatosplenomegaly Extremities: no clubbing, cyanosis or edema; 2+ radial, ulnar and  brachial pulses bilaterally; 2+ right femoral, posterior tibial and dorsalis pedis pulses; 2+ left femoral, posterior tibial and dorsalis pedis pulses; no subclavian or femoral bruits Neurological: grossly nonfocal Psych: Normal mood and affect    Wt Readings from Last 3 Encounters:  12/26/23 182 lb 6.4 oz (82.7 kg)  09/17/23 175 lb (79.4 kg)  08/20/22 172 lb 3.2 oz (78.1 kg)      Studies/Labs Reviewed:   EKG:    EKG Interpretation Date/Time:  Thursday December 26 2023 11:49:13 EDT Ventricular Rate:  69 PR Interval:  148 QRS Duration:  86 QT Interval:  386 QTC Calculation: 413 R Axis:   7  Text Interpretation: Normal sinus rhythm Normal ECG When compared with ECG of 05-Sep-2020 09:31, No significant change was found Confirmed by Jeny Nield [phone]) on 12/26/2023 12:00:00 PM         Comprehensive pacemaker check in the office today.  His device has switched to VVI fixed pacing at 65 bpm and only limited interrogation of the ventricular lead is possible.  Unable to reprogram the device.  Recent Labs: No results found for requested labs within last 365 days.  12/11/2023 Cholesterol 139, triglycerides 81, HDL 42, LDL 81 Hemoglobin V7Q 6.9%, creatinine 1.14, potassium 4, normal liver function tests  Lipid Panel    Component Value Date/Time   CHOL 181 08/14/2021 0916   CHOL 150 12/07/2019 0000   TRIG 182 (H) 08/14/2021 0916   TRIG 78 12/07/2019 0000   HDL 37 (L) 08/14/2021 0916   HDL 38 12/07/2019 0000   CHOLHDL 4.9 08/14/2021 0916   CHOLHDL 3.9 12/07/2019 0000   VLDL 72 (H) 10/22/2011 0507   LDLCALC 112 (H) 08/14/2021 0916      ASSESSMENT:    1. Pacemaker battery depletion   2. Paroxysmal atrial fibrillation (HCC)   3. Neurocardiogenic syncope   4. Type 1 diabetes mellitus with hypoglycemia and without coma (HCC)   5. Hypercholesterolemia      PLAN:  In order of problems listed above:  Pacemaker at ERI : He is not pacemaker dependent and only requires  brief intermittent pacing for episodes of neurocardiogenic pauses.  Will schedule him for pacemaker generator change out.  Unable to fully check his AMV leads today, but they had excellent parameters at the last for pacemaker check.  The risks and benefits of the procedure and in particular risk of infection were discussed with him in detail.  He understands and agrees to proceed.  We have recently performed chemistry labs.  I prefer not to have them draw CBC since he is so prone to passing out with medical procedures.  AFib: None has occurred in the last 6 years.  He had a single episode of atrial fibrillation recorded by his pacemaker in 2019, during a period of intense emotional distress, none since.   Low embolic risk. CHADSVasc 1 (DM).  No history of TIA/stroke or systemic embolism.  Currently on aspirin. HTN: He does not really have high blood pressure.  He takes lisinopril low-dose for renal protection.  He should not receive diuretics due to his risk for syncope. Syncope: Whereas he previously had numerous syncopal events, since pacemaker implantation the only had one event around Christmas 2024, likely due to vasodepressor mechanism.  Over the years, his pacemaker has also recorded frequent episodes of sudden sinus tachycardia which may represent aborted neurally mediated syncope.  Will make sure he has sudden bradycardia response turned on in his device settings.  His history is highly suggestive of neurally mediated cardioinhibitory syncope.   DM: Although he initially started off as an insulin resistant type II diabetic he has clearly progressed to type 1 diabetes and is very insulin sensitive.  He now has excellent glycemic control with insulin.  I asked him to take only 10 units instead of 25 units of Lantus insulin on the morning of his procedure.  He will not receive any regular insulin at lunchtime.  Will monitor closely for hypoglycemia around the time of his procedure.   HLP: All parameters  are in target range on atorvastatin..  Continue.     Medication Adjustments/Labs and Tests Ordered: Current medicines are reviewed at length with the patient today.  Concerns regarding medicines are outlined above.  Medication changes, Labs and Tests ordered today are listed in the Patient Instructions below. Patient Instructions  Medication Instructions:  No changes *If you need a refill on your cardiac medications before your next appointment, please call your pharmacy*  Testing/Procedures:     Implantable Device Instructions    Theodore [name]  12/26/2023  You are scheduled for a Permanent transvenous pacemaker (PPM) on Thursday, April 24 with Dr. Rachelle Hora Denelle Capurro.  1. Pre procedure Lab testing: Labs from 12/11/23   2. Please arrive at the [facility] (Main Entrance A) at [facility]: [address] [place], [place] [address] at 12:30 PM (This time is 1 hour(s) before your procedure to ensure your preparation).   Free valet parking service is available. You will check in at ADMITTING. The support person will be asked to wait in the waiting room.  It is OK to have someone drop you off and come back when you are ready to be discharged.          Special note: Every effort is made to have your procedure done on time. Please understand that emergencies sometimes delay  scheduled procedures.  3. You may eat breakfast. Only water after breakfast.    4.  Medication instructions:   You may have breakfast- take 10 units of Lantus and 6 units of Novolog with breakfast. Nothing to eat after this time. You may drink water      !!IF ANY NEW MEDICATIONS ARE STARTED AFTER TODAY, PLEASE NOTIFY YOUR PROVIDER AS SOON AS POSSIBLE!!  FYI: Medications such as Semaglutide (Ozempic, [place]), Tirzepatide (Mounjaro, Zepbound), Dulaglutide (Trulicity), etc ("GLP1 agonists") AND Canagliflozin (Invokana), Dapagliflozin (Farxiga), Empagliflozin (Jardiance), Ertugliflozin (Steglatro), Bexagliflozin  [employer]) or any combination with one of these drugs such as Invokamet (Canagliflozin/Metformin), Synjardy (Empagliflozin/Metformin), etc ("SGLT2 inhibitors") must be held around the time of a procedure. This is not a comprehensive list  of all of these drugs. Please review all of your medications and talk to your provider if you take any one of these. If you are not sure, ask your provider.   5.  The night before your procedure and the morning of your procedure scrub your neck/chest with CHG surgical scrub.  See instruction letter.  6. Plan to go home the same day, you will only stay overnight if medically necessary. 7. You MUST have a responsible adult to drive you home. 8. An adult MUST be with you the first 24 hours after you arrive home. 9. Bring a current list of your medications, and the last time and date medication taken. 10. Bring ID and current insurance cards. 11. Please wear clothes that are easy to get on and off and wear slip-on shoes.    You will follow up with the [facility] 10-14 days after your procedure.  You will follow up with Dr. Rachelle Hora Shaquera Ansley 91 days after your procedure.  These appointments will be made for you.   * If you have ANY questions after you get home, please call the office at [phone] or send a MyChart message.  FYI: For your safety, and to allow [place] to monitor your vital signs accurately during the surgery/procedure we request that if you have artificial nails, gel coating, SNS etc. Please have those removed prior to your surgery/procedure. Not having the nail coverings /polish removed may result in cancellation or delay of your surgery/procedure.    [facility] - Preparing For Surgery    Before surgery, you can play an important role. Because skin is not sterile, your skin needs to be as free of germs as possible. You can reduce the number of germs on your skin by washing with CHG (chlorahexidine gluconate) Soap before surgery.  CHG is an  antiseptic cleaner which kills germs and bonds with the skin to continue killing germs even after washing.  Please do not use if you have an allergy to CHG or antibacterial soaps.  If your skin becomes reddened/irritated stop using the CHG.   Do not shave (including legs and underarms) for at least 48 hours prior to first CHG shower.  It is OK to shave your face.  Please follow these instructions carefully:  1.  Shower the night before surgery and the morning of surgery with CHG.  2.  If you choose to wash your hair, wash your hair first as usual with your normal shampoo.  3.  After you shampoo, rinse your hair and body thoroughly to remove the shampoo.  4.  Use CHG as you would any other liquid soap.  You can apply CHG directly to the skin and wash gently with a clean washcloth. 5.  Apply the CHG Soap to your body ONLY FROM THE NECK DOWN.  Do not use on open wounds or open sores.  Avoid contact with your eyes, ears, mouth and genitals (private parts).  Wash genitals (private parts) with your normal soap.  6.  Wash thoroughly, paying special attention to the area where your surgery will be performed.  7.  Thoroughly rinse your body with warm water from the neck down.   8.  DO NOT shower/wash with your normal soap after using and rinsing off the CHG soap.  9.  Pat yourself dry with a clean towel.           10.  Wear clean pajamas.  11.  Place clean sheets on your bed the night of your first shower and do not sleep with pets.  Day of Surgery: Do not apply any deodorants/lotions.  Please wear clean clothes to the hospital/surgery center.   Follow-Up: At [facility], you and your health needs are our priority.  As part of our continuing mission to provide you with exceptional heart care, our providers are all part of one team.  This team includes your primary Cardiologist (physician) and Advanced Practice Providers or APPs (Physician Assistants and Nurse Practitioners) who all  work together to provide you with the care you need, when you need it.  Your next appointment:   Will be scheduled after procedure  Provider:   Thurmon Fair, MD     We recommend signing up for the patient portal called "MyChart".  Sign up information is provided on this After Visit Summary.  MyChart is used to connect with patients for Virtual Visits (Telemedicine).  Patients are able to view lab/test results, encounter notes, upcoming appointments, etc.  Non-urgent messages can be sent to your provider as well.   To learn more about what you can do with MyChart, go to [email or web address].        1st Floor: - Lobby - Registration  - Pharmacy  - Lab - Cafe  2nd Floor: - PV Lab - Diagnostic Testing (echo, CT, nuclear med)  3rd Floor: - Vacant  4th Floor: - TCTS (cardiothoracic surgery) - AFib Clinic - Structural Heart Clinic - Vascular Surgery  - Vascular Ultrasound  5th Floor: - HeartCare Cardiology (general and EP) - Clinical Pharmacy for coumadin, hypertension, lipid, weight-loss medications, and med management appointments    Valet parking services will be available as well.      Signed, Thurmon Fair, MD  12/26/2023 2:07 PM    [facility] [address] [place], [place], [place]  [address] Phone: [phone]; Fax: [phone]

## 2023-12-26 NOTE — H&P (View-Only) (Signed)
 Patient ID: Theodore [name], male   DOB: June 25, 1966, 58 y.o.   MRN: [id number]    Cardiology Office Note    Date:  12/26/2023   ID:  Theodore [name], DOB July 03, 1966, MRN [id number]  PCP:  [facility]  Cardiologist:   Thurmon Fair, MD   Chief Complaint  Patient presents with   Pacemaker Problem    History of Present Illness:  Theodore [name] is a 58 y.o. male with insulin-dependent diabetes mellitus and previous history of numerous episodes of syncope associated with prolonged sinus pauses, which completely resolved following implantation of a dual-chamber permanent pacemaker in 2009. He probably had neurally mediated syncope.  His device recorded a single episode of paroxysmal atrial fibrillation in December 2019 (around that time his mother had a stroke that was eventually fatal).  The arrhythmia lasted for over 19 hours and was not symptomatic and has not recurred since then.  His pacemaker has reached recommended replacement trigger.  He is here to discuss generator change out.  He had a syncopal event in the bathroom in late December and injured his coccyx.  He has recovered well from that.  We brought him back to the office to reprogram his pacemaker with more aggressive sudden bradycardia response.  It looks like not long after that his device reached RRT.  The device has switched to VVI mode at 65 bpm and cannot be reprogrammed.  The patient specifically denies any chest pain at rest or with exertion, dyspnea at rest or with exertion, orthopnea, paroxysmal nocturnal dyspnea, syncope, palpitations, focal neurological deficits, intermittent claudication, lower extremity edema, unexplained weight gain, cough, hemoptysis or wheezing.   Glycemic control has improved and his most recent hemoglobin A1c was 6.9%, the first time it has been less than 7% in several years.  He has normal renal parameters and electrolytes and normal liver function tests on labs performed  12/11/2023. Same day he had a lipid profile showing HDL 42 and LDL 81, normal triglycerides.  His pacemaker is a Medtronic Adapta device implanted in 2009 with Medtronic [id number] atrial and ventricular leads.  He almost never requires pacing (usually around 7% atrial pacing and no ventricular pacing).  When last fully checked, the leads have excellent parameters: Atrial lead capture threshold 0.5 V at 0.4 ms, sensed P waves greater than 2.8 mV, atrial lead impedance 461 ohms, ventricular lead threshold 0.75 V at 0.4 ms, R waves greater than 16 mV, ventricular lead impedance 567 ohms.  Theodore [name]'s triggers for syncope seem to be consistently related to the observation or discussion of health problems and medical procedures. For example he once passed out watching a person wearing oxygen mask and another time passed out discussing a newly diagnosed lung cancer in his uncle. His son has syncope associated with rapid palpitations, probably also neurally mediated.  He has insulin requiring type 2 diabetes mellitus and systemic hypertension both of which appear to be well treated. He is minimally overweight. He had an echocardiogram in 2009 that was a completely normal study. At that time he also had cardiac catheterization that showed normal coronary arteries. 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  Past Medical History:  Diagnosis Date   Anxiety    Arthritis    Bilateral elbow joint pain    Chronic back pain 03/18/2018   DDD (degenerative disc disease), lumbar    Depression    Diabetes mellitus    dx  2010 type 2   Lumbar radiculopathy    Lumbar spine pain    Neurogenic claudication due to lumbar spinal stenosis    Neurogenic claudication due to lumbar spinal stenosis 05/10/2018   Pacemaker    Permanent pacemaker placement by Dr. Molly Maduro McQueen--10/21/2007--a Medtronic Adapta--serial number #[id number]   Pain in joint of left shoulder     Post laminectomy syndrome 07/07/2020   Radiculopathy of lumbar region    Syncope 09/2007   2D Echo--History of frequent episodes of syncope beginning at 58 years of age--usually associated with some type of vagal issue but he does completely lose consciousness    Past Surgical History:  Procedure Laterality Date   ABDOMINAL EXPOSURE  09/08/2020   Procedure: ABDOMINAL EXPOSURE;  Surgeon: [name], MD;  Location: [facility] OR;  Service: Orthopedics;;   ANTERIOR LUMBAR FUSION N/A 09/08/2020   Procedure: Anterior lumbar interbody fusion L5-S1, posterior spinal fusion interbody L5-S1;  Surgeon: [name], MD;  Location: [facility] OR;  Service: Orthopedics;  Laterality: N/A;  ; move other case to follow   CARDIAC CATHETERIZATION Right 10/20/2007   COLONOSCOPY     HAND SURGERY     HARVEST BONE GRAFT  09/08/2020   Procedure: HARVEST ILIAC BONE GRAFT;  Surgeon: [name], MD;  Location: [facility] OR;  Service: Orthopedics;;   LUMBAR LAMINECTOMY/DECOMPRESSION MICRODISCECTOMY N/A 06/05/2018   Procedure: Lumbar decompression L4-S1;  Surgeon: [name], MD;  Location: [facility] OR;  Service: Orthopedics;  Laterality: N/A;  3 hrs   PACEMAKER INSERTION  10/21/2007   Dr Jenne Campus   SHOULDER SURGERY  1999   arthroscopy   TONSILLECTOMY      Current Medications: Outpatient Medications Prior to Visit  Medication Sig Dispense Refill   atorvastatin (LIPITOR) 10 MG tablet Take 10 mg by mouth daily.     Insulin Aspart FlexPen (NOVOLOG) 100 UNIT/ML Inject 6 Units into the skin 3 (three) times daily with meals.     Insulin Glargine Solostar (LANTUS) 100 UNIT/ML Solostar Pen Inject 24 Units into the skin daily in the afternoon.     Insulin Pen Needle (B-D UF III MINI PEN NEEDLES) 31G X 5 MM MISC BD Ultra-Fine Short Pen Needle 31 gauge x 5/16"  Use with Basaglar Kwikpen 200 each 1   lisinopril (ZESTRIL) 5 MG tablet Take 1 tablet (5 mg total) by mouth daily. 90 tablet 1   ACCU-CHEK AVIVA PLUS test strip Check  blood sugars twice a day. Dx: E11.59, Z79.4 (Patient not taking: Reported on 12/26/2023) 100 each 2   insulin aspart (NOVOLOG) 100 UNIT/ML injection Inject 2 Units into the skin 3 (three) times daily with meals. (Patient not taking: Reported on 12/26/2023) 10 mL 11   oxyCODONE (ROXICODONE) 5 MG immediate release tablet Take 1 tablet (5 mg total) by mouth every 6 (six) hours as needed for severe pain (pain score 7-10). (Patient not taking: Reported on 12/26/2023) 15 tablet 0   No facility-administered medications prior to visit.     Allergies:   Cephalexin, Cephalexin, and Vancomycin   Family History:  The patient's family history includes Coronary artery disease (age of onset: 21's) in his mother; Diabetes in his mother and sister; Hypertension in his mother; Stroke in his mother.   ROS:   Please see the history of present illness.    ROS All other systems reviewed and are negative.   PHYSICAL EXAM:   VS:  BP 110/76 (BP Location: Left Arm, Patient Position: Sitting, Cuff Size: Normal)   Pulse 69  Ht 5\' 8"  (1.727 m)   Wt 182 lb 6.4 oz (82.7 kg)   SpO2 94%   BMI 27.73 kg/m      General: Alert, oriented x3, no distress, healthy pacemaker site Head: no evidence of trauma, PERRL, EOMI, no exophtalmos or lid lag, no myxedema, no xanthelasma; normal ears, nose and oropharynx Neck: normal jugular venous pulsations and no hepatojugular reflux; brisk carotid pulses without delay and no carotid bruits Chest: clear to auscultation, no signs of consolidation by percussion or palpation, normal fremitus, symmetrical and full respiratory excursions Cardiovascular: normal position and quality of the apical impulse, regular rhythm, normal first and second heart sounds, no murmurs, rubs or gallops Abdomen: no tenderness or distention, no masses by palpation, no abnormal pulsatility or arterial bruits, normal bowel sounds, no hepatosplenomegaly Extremities: no clubbing, cyanosis or edema; 2+ radial, ulnar and  brachial pulses bilaterally; 2+ right femoral, posterior tibial and dorsalis pedis pulses; 2+ left femoral, posterior tibial and dorsalis pedis pulses; no subclavian or femoral bruits Neurological: grossly nonfocal Psych: Normal mood and affect    Wt Readings from Last 3 Encounters:  12/26/23 182 lb 6.4 oz (82.7 kg)  09/17/23 175 lb (79.4 kg)  08/20/22 172 lb 3.2 oz (78.1 kg)      Studies/Labs Reviewed:   EKG:    EKG Interpretation Date/Time:  Thursday December 26 2023 11:49:13 EDT Ventricular Rate:  69 PR Interval:  148 QRS Duration:  86 QT Interval:  386 QTC Calculation: 413 R Axis:   7  Text Interpretation: Normal sinus rhythm Normal ECG When compared with ECG of 05-Sep-2020 09:31, No significant change was found Confirmed by Jeny Nield [phone]) on 12/26/2023 12:00:00 PM         Comprehensive pacemaker check in the office today.  His device has switched to VVI fixed pacing at 65 bpm and only limited interrogation of the ventricular lead is possible.  Unable to reprogram the device.  Recent Labs: No results found for requested labs within last 365 days.  12/11/2023 Cholesterol 139, triglycerides 81, HDL 42, LDL 81 Hemoglobin V7Q 6.9%, creatinine 1.14, potassium 4, normal liver function tests  Lipid Panel    Component Value Date/Time   CHOL 181 08/14/2021 0916   CHOL 150 12/07/2019 0000   TRIG 182 (H) 08/14/2021 0916   TRIG 78 12/07/2019 0000   HDL 37 (L) 08/14/2021 0916   HDL 38 12/07/2019 0000   CHOLHDL 4.9 08/14/2021 0916   CHOLHDL 3.9 12/07/2019 0000   VLDL 72 (H) 10/22/2011 0507   LDLCALC 112 (H) 08/14/2021 0916      ASSESSMENT:    1. Pacemaker battery depletion   2. Paroxysmal atrial fibrillation (HCC)   3. Neurocardiogenic syncope   4. Type 1 diabetes mellitus with hypoglycemia and without coma (HCC)   5. Hypercholesterolemia      PLAN:  In order of problems listed above:  Pacemaker at ERI : He is not pacemaker dependent and only requires  brief intermittent pacing for episodes of neurocardiogenic pauses.  Will schedule him for pacemaker generator change out.  Unable to fully check his AMV leads today, but they had excellent parameters at the last for pacemaker check.  The risks and benefits of the procedure and in particular risk of infection were discussed with him in detail.  He understands and agrees to proceed.  We have recently performed chemistry labs.  I prefer not to have them draw CBC since he is so prone to passing out with medical procedures.  AFib: None has occurred in the last 6 years.  He had a single episode of atrial fibrillation recorded by his pacemaker in 2019, during a period of intense emotional distress, none since.   Low embolic risk. CHADSVasc 1 (DM).  No history of TIA/stroke or systemic embolism.  Currently on aspirin. HTN: He does not really have high blood pressure.  He takes lisinopril low-dose for renal protection.  He should not receive diuretics due to his risk for syncope. Syncope: Whereas he previously had numerous syncopal events, since pacemaker implantation the only had one event around Christmas 2024, likely due to vasodepressor mechanism.  Over the years, his pacemaker has also recorded frequent episodes of sudden sinus tachycardia which may represent aborted neurally mediated syncope.  Will make sure he has sudden bradycardia response turned on in his device settings.  His history is highly suggestive of neurally mediated cardioinhibitory syncope.   DM: Although he initially started off as an insulin resistant type II diabetic he has clearly progressed to type 1 diabetes and is very insulin sensitive.  He now has excellent glycemic control with insulin.  I asked him to take only 10 units instead of 25 units of Lantus insulin on the morning of his procedure.  He will not receive any regular insulin at lunchtime.  Will monitor closely for hypoglycemia around the time of his procedure.   HLP: All parameters  are in target range on atorvastatin..  Continue.     Medication Adjustments/Labs and Tests Ordered: Current medicines are reviewed at length with the patient today.  Concerns regarding medicines are outlined above.  Medication changes, Labs and Tests ordered today are listed in the Patient Instructions below. Patient Instructions  Medication Instructions:  No changes *If you need a refill on your cardiac medications before your next appointment, please call your pharmacy*  Testing/Procedures:     Implantable Device Instructions    Theodore [name]  12/26/2023  You are scheduled for a Permanent transvenous pacemaker (PPM) on Thursday, April 24 with Dr. Rachelle Hora Denelle Capurro.  1. Pre procedure Lab testing: Labs from 12/11/23   2. Please arrive at the [facility] (Main Entrance A) at [facility]: [address] [place], [place] [address] at 12:30 PM (This time is 1 hour(s) before your procedure to ensure your preparation).   Free valet parking service is available. You will check in at ADMITTING. The support person will be asked to wait in the waiting room.  It is OK to have someone drop you off and come back when you are ready to be discharged.          Special note: Every effort is made to have your procedure done on time. Please understand that emergencies sometimes delay  scheduled procedures.  3. You may eat breakfast. Only water after breakfast.    4.  Medication instructions:   You may have breakfast- take 10 units of Lantus and 6 units of Novolog with breakfast. Nothing to eat after this time. You may drink water      !!IF ANY NEW MEDICATIONS ARE STARTED AFTER TODAY, PLEASE NOTIFY YOUR PROVIDER AS SOON AS POSSIBLE!!  FYI: Medications such as Semaglutide (Ozempic, [place]), Tirzepatide (Mounjaro, Zepbound), Dulaglutide (Trulicity), etc ("GLP1 agonists") AND Canagliflozin (Invokana), Dapagliflozin (Farxiga), Empagliflozin (Jardiance), Ertugliflozin (Steglatro), Bexagliflozin  [employer]) or any combination with one of these drugs such as Invokamet (Canagliflozin/Metformin), Synjardy (Empagliflozin/Metformin), etc ("SGLT2 inhibitors") must be held around the time of a procedure. This is not a comprehensive list  of all of these drugs. Please review all of your medications and talk to your provider if you take any one of these. If you are not sure, ask your provider.   5.  The night before your procedure and the morning of your procedure scrub your neck/chest with CHG surgical scrub.  See instruction letter.  6. Plan to go home the same day, you will only stay overnight if medically necessary. 7. You MUST have a responsible adult to drive you home. 8. An adult MUST be with you the first 24 hours after you arrive home. 9. Bring a current list of your medications, and the last time and date medication taken. 10. Bring ID and current insurance cards. 11. Please wear clothes that are easy to get on and off and wear slip-on shoes.    You will follow up with the [facility] clinic 10-14 days after your procedure.  You will follow up with Dr. Rachelle Hora Shaquera Ansley 91 days after your procedure.  These appointments will be made for you.   * If you have ANY questions after you get home, please call the office at [phone] or send a MyChart message.  FYI: For your safety, and to allow [place] to monitor your vital signs accurately during the surgery/procedure we request that if you have artificial nails, gel coating, SNS etc. Please have those removed prior to your surgery/procedure. Not having the nail coverings /polish removed may result in cancellation or delay of your surgery/procedure.    [facility] - Preparing For Surgery    Before surgery, you can play an important role. Because skin is not sterile, your skin needs to be as free of germs as possible. You can reduce the number of germs on your skin by washing with CHG (chlorahexidine gluconate) Soap before surgery.  CHG is an  antiseptic cleaner which kills germs and bonds with the skin to continue killing germs even after washing.  Please do not use if you have an allergy to CHG or antibacterial soaps.  If your skin becomes reddened/irritated stop using the CHG.   Do not shave (including legs and underarms) for at least 48 hours prior to first CHG shower.  It is OK to shave your face.  Please follow these instructions carefully:  1.  Shower the night before surgery and the morning of surgery with CHG.  2.  If you choose to wash your hair, wash your hair first as usual with your normal shampoo.  3.  After you shampoo, rinse your hair and body thoroughly to remove the shampoo.  4.  Use CHG as you would any other liquid soap.  You can apply CHG directly to the skin and wash gently with a clean washcloth. 5.  Apply the CHG Soap to your body ONLY FROM THE NECK DOWN.  Do not use on open wounds or open sores.  Avoid contact with your eyes, ears, mouth and genitals (private parts).  Wash genitals (private parts) with your normal soap.  6.  Wash thoroughly, paying special attention to the area where your surgery will be performed.  7.  Thoroughly rinse your body with warm water from the neck down.   8.  DO NOT shower/wash with your normal soap after using and rinsing off the CHG soap.  9.  Pat yourself dry with a clean towel.           10.  Wear clean pajamas.  11.  Place clean sheets on your bed the night of your first shower and do not sleep with pets.  Day of Surgery: Do not apply any deodorants/lotions.  Please wear clean clothes to the hospital/surgery center.   Follow-Up: At [facility], you and your health needs are our priority.  As part of our continuing mission to provide you with exceptional heart care, our providers are all part of one team.  This team includes your primary Cardiologist (physician) and Advanced Practice Providers or APPs (Physician Assistants and Nurse Practitioners) who all  work together to provide you with the care you need, when you need it.  Your next appointment:   Will be scheduled after procedure  Provider:   Thurmon Fair, MD     We recommend signing up for the patient portal called "MyChart".  Sign up information is provided on this After Visit Summary.  MyChart is used to connect with patients for Virtual Visits (Telemedicine).  Patients are able to view lab/test results, encounter notes, upcoming appointments, etc.  Non-urgent messages can be sent to your provider as well.   To learn more about what you can do with MyChart, go to [email or web address].        1st Floor: - Lobby - Registration  - Pharmacy  - Lab - Cafe  2nd Floor: - PV Lab - Diagnostic Testing (echo, CT, nuclear med)  3rd Floor: - Vacant  4th Floor: - TCTS (cardiothoracic surgery) - AFib Clinic - Structural Heart Clinic - Vascular Surgery  - Vascular Ultrasound  5th Floor: - HeartCare Cardiology (general and EP) - Clinical Pharmacy for coumadin, hypertension, lipid, weight-loss medications, and med management appointments    Valet parking services will be available as well.      Signed, Thurmon Fair, MD  12/26/2023 2:07 PM    [facility] [address] [place], [place], [place]  [address] Phone: [phone]; Fax: [phone]

## 2024-01-13 ENCOUNTER — Encounter: Payer: Self-pay | Admitting: Emergency Medicine

## 2024-01-16 ENCOUNTER — Encounter (HOSPITAL_COMMUNITY)
Admission: RE | Disposition: A | Discharge status: Home / Self Care | Payer: Self-pay | Source: Home / Self Care | Attending: Cardiovascular Disease

## 2024-01-16 ENCOUNTER — Other Ambulatory Visit: Payer: Self-pay

## 2024-01-16 ENCOUNTER — Ambulatory Visit (HOSPITAL_COMMUNITY)
Admission: RE | Admit: 2024-01-16 | Discharge: 2024-01-16 | Disposition: A | Discharge status: Home / Self Care | Attending: Cardiovascular Disease | Admitting: Cardiovascular Disease

## 2024-01-16 DIAGNOSIS — I480 Paroxysmal atrial fibrillation: Secondary | ICD-10-CM | POA: Insufficient documentation

## 2024-01-16 DIAGNOSIS — Z45010 Encounter for checking and testing of cardiac pacemaker pulse generator [battery]: Secondary | ICD-10-CM | POA: Insufficient documentation

## 2024-01-16 DIAGNOSIS — Z79899 Other long term (current) drug therapy: Secondary | ICD-10-CM | POA: Diagnosis not present

## 2024-01-16 DIAGNOSIS — E119 Type 2 diabetes mellitus without complications: Secondary | ICD-10-CM | POA: Diagnosis not present

## 2024-01-16 DIAGNOSIS — E7800 Pure hypercholesterolemia, unspecified: Secondary | ICD-10-CM | POA: Insufficient documentation

## 2024-01-16 DIAGNOSIS — R55 Syncope and collapse: Secondary | ICD-10-CM | POA: Insufficient documentation

## 2024-01-16 DIAGNOSIS — Z794 Long term (current) use of insulin: Secondary | ICD-10-CM | POA: Diagnosis not present

## 2024-01-16 DIAGNOSIS — R001 Bradycardia, unspecified: Secondary | ICD-10-CM

## 2024-01-16 HISTORY — PX: PPM GENERATOR CHANGEOUT: EP1233

## 2024-01-16 LAB — CBC
HCT: 44.5 % (ref 39.0–52.0)
Hemoglobin: 15.7 g/dL (ref 13.0–17.0)
MCH: 35 pg — ABNORMAL HIGH (ref 26.0–34.0)
MCHC: 35.3 g/dL (ref 30.0–36.0)
MCV: 99.1 fL (ref 80.0–100.0)
Platelets: 134 10*3/uL — ABNORMAL LOW (ref 150–400)
RBC: 4.49 MIL/uL (ref 4.22–5.81)
RDW: 13.1 % (ref 11.5–15.5)
WBC: 6.3 10*3/uL (ref 4.0–10.5)
nRBC: 0 % (ref 0.0–0.2)

## 2024-01-16 LAB — GLUCOSE, CAPILLARY
Glucose-Capillary: 183 mg/dL — ABNORMAL HIGH (ref 70–99)
Glucose-Capillary: 155 mg/dL — ABNORMAL HIGH (ref 70–99)

## 2024-01-16 LAB — BASIC METABOLIC PANEL WITH GFR
Anion gap: 10 (ref 5–15)
BUN: 14 mg/dL (ref 6–20)
CO2: 22 mmol/L (ref 22–32)
Calcium: 8.7 mg/dL — ABNORMAL LOW (ref 8.9–10.3)
Chloride: 106 mmol/L (ref 98–111)
Creatinine, Ser: 1.2 mg/dL (ref 0.61–1.24)
GFR, Estimated: >60 mL/min (ref >60)
Glucose, Bld: 193 mg/dL — ABNORMAL HIGH (ref 70–99)
Potassium: 4.4 mmol/L (ref 3.5–5.1)
Sodium: 138 mmol/L (ref 135–145)

## 2024-01-16 SURGERY — PPM GENERATOR CHANGEOUT
Anesthesia: LOCAL

## 2024-01-16 MED ORDER — CHLORHEXIDINE GLUCONATE 4 % EX SOLN
4.0000 | Freq: Once | CUTANEOUS | Status: DC
  Filled 2024-01-16: qty 60

## 2024-01-16 MED ORDER — LIDOCAINE HCL (PF) 1 % IJ SOLN
INTRAMUSCULAR | Status: DC | PRN
  Administered 2024-01-16: 30 mL

## 2024-01-16 MED ORDER — SODIUM CHLORIDE 0.9 % IV SOLN
INTRAVENOUS | Status: DC | PRN
  Administered 2024-01-16: 80 mg

## 2024-01-16 MED ORDER — CEFAZOLIN SODIUM-DEXTROSE 2-3 GM-%(50ML) IV SOLR
INTRAVENOUS | Status: DC | PRN
  Administered 2024-01-16: 2 g via INTRAVENOUS

## 2024-01-16 MED ORDER — SODIUM CHLORIDE 0.9% FLUSH: 3.0000 mL | INTRAVENOUS | Status: DC | PRN

## 2024-01-16 MED ORDER — SODIUM CHLORIDE 0.9% FLUSH: 3.0000 mL | Freq: Two times a day (BID) | INTRAVENOUS | Status: DC

## 2024-01-16 MED ORDER — POVIDONE-IODINE 10 % EX SWAB
2.0000 | Freq: Once | CUTANEOUS | Status: AC
  Administered 2024-01-16: 2 via TOPICAL

## 2024-01-16 MED ORDER — SODIUM CHLORIDE 0.9 % IV SOLN: 80.0000 mg | INTRAVENOUS | Status: DC

## 2024-01-16 MED ORDER — CEFAZOLIN SODIUM-DEXTROSE 2-4 GM/100ML-% IV SOLN
INTRAVENOUS | Status: AC
  Filled 2024-01-16: qty 100

## 2024-01-16 MED ORDER — ONDANSETRON HCL 4 MG/2ML IJ SOLN: 4.0000 mg | Freq: Four times a day (QID) | INTRAMUSCULAR | Status: DC | PRN

## 2024-01-16 MED ORDER — ACETAMINOPHEN 325 MG PO TABS: 325.0000 mg | ORAL_TABLET | ORAL | Status: DC | PRN

## 2024-01-16 MED ORDER — LIDOCAINE HCL (PF) 1 % IJ SOLN
INTRAMUSCULAR | Status: AC
  Filled 2024-01-16: qty 60

## 2024-01-16 MED ORDER — GENTAMICIN SULFATE 40 MG/ML IJ SOLN
INTRAMUSCULAR | Status: AC
  Filled 2024-01-16: qty 2

## 2024-01-16 MED ORDER — CEFAZOLIN SODIUM-DEXTROSE 2-4 GM/100ML-% IV SOLN: 2.0000 g | INTRAVENOUS | Status: DC

## 2024-01-16 MED ORDER — SODIUM CHLORIDE 0.9 % IV SOLN: INTRAVENOUS | Status: DC | PRN

## 2024-01-16 SURGICAL SUPPLY — 4 items
CABLE SURGICAL S-101-97-12 (CABLE) ×1 IMPLANT
IPG PACE AZUR XT DR MRI W1DR01 (Pacemaker) IMPLANT
PAD DEFIB RADIO PHYSIO CONN (PAD) ×1 IMPLANT
TRAY PACEMAKER INSERTION (PACKS) ×1 IMPLANT

## 2024-01-16 NOTE — Op Note (Signed)
 Procedure report  Procedure performed:  Dual chamber generator changeout   Reason for procedure:  1. Device generator at elective replacement interval  2. Neurocardiogenic syncope Procedure performed by:  Luana Rumple, MD  Complications:  None  Estimated blood loss:  <5 mL  Medications administered during procedure:  Ancef  2 g intravenously, lidocaine  1% 30 mL locally Device details:   New Generator Medtronic model number [id number], serial number [id number] Right atrial lead (chronic) Medtronic, model number [id number], serial number [id number] (implanted 10/21/2007) Right ventricular lead (chronic)  Medtronic, model number [id number], serial number [id number] (implanted 10/21/2007)  Explanted generator Medtronic Adapta,  model number [id number], (implanted 10/21/2007)  Procedure details:  After the risks and benefits of the procedure were discussed the patient provided informed consent. She was brought to the cardiac catheter lab in the fasting state. The patient was prepped and draped in usual sterile fashion. Local anesthesia with 1% lidocaine  was administered to to the left infraclavicular area. A 5-6cm horizontal incision was made parallel with and 2-3 cm caudal to the left clavicle, in the area of an old scar.  Using minimal electrocautery and mostly sharp and blunt dissection the prepectoral pocket was opened carefully to avoid injury to the loops of chronic leads. Extensive dissection was not necessary. The device was explanted. The pocket was carefully inspected for hemostasis and flushed with copious amounts of antibiotic solution.  The leads were disconnected from the old generator attached to the new generator, with appropriate pacing noted. Telemetry testing of the lead parameters later showed unchanged values.   The entire system was then carefully inserted in the pocket with care been taking that the leads and device assumed a comfortable position without pressure on the incision.  Great care was taken that the leads be located deep to the generator. The pocket was then closed in layers using 2 layers of 2-0 Vicryl, one layer of 3-0 Vicryl and cutaneous steristrips after which a sterile dressing was applied.   At the end of the procedure the following lead parameters were encountered:   Right atrial lead sensed P waves 4.9 mV, impedance 456 ohms, threshold 0.75 at 0.4 ms pulse width.  Right ventricular lead sensed R waves  14.9 mV, impedance 475 ohms, threshold 1.0 at 0.4 ms pulse width.  Luana Rumple, MD, [facility] [phone] office [phone] pager

## 2024-01-16 NOTE — Interval H&P Note (Signed)
 History and Physical Interval Note:  01/16/2024 1:28 PM  Theodore [name]  has presented today for surgery, with the diagnosis of eri neurocardiogenic syncope.  The various methods of treatment have been discussed with the patient and family. After consideration of risks, benefits and other options for treatment, the patient has consented to  Procedure(s): PPM GENERATOR CHANGEOUT (N/A) as a surgical intervention.  The patient's history has been reviewed, patient examined, no change in status, stable for surgery.  I have reviewed the patient's chart and labs.  Questions were answered to the patient's satisfaction.     Gay Rape

## 2024-01-16 NOTE — Discharge Instructions (Signed)

## 2024-01-17 ENCOUNTER — Encounter (HOSPITAL_COMMUNITY): Payer: Self-pay | Admitting: Cardiovascular Disease

## 2024-01-30 ENCOUNTER — Ambulatory Visit: Attending: Internal Medicine

## 2024-01-30 DIAGNOSIS — R55 Syncope and collapse: Secondary | ICD-10-CM

## 2024-01-30 LAB — CUP PACEART INCLINIC DEVICE CHECK
Battery Remaining Longevity: 170 mo
Battery Voltage: 3.23 V
Brady Statistic AP VP Percent: 17.33 %
Brady Statistic AP VS Percent: 3.93 %
Brady Statistic AS VP Percent: 0.03 %
Brady Statistic AS VS Percent: 78.72 %
Brady Statistic RA Percent Paced: 21.25 %
Brady Statistic RV Percent Paced: 17.35 %
Date Time Interrogation Session: 20250508125928
Implantable Lead Connection Status: 753985
Implantable Lead Connection Status: 753985
Implantable Lead Implant Date: 20090127
Implantable Lead Implant Date: 20090127
Implantable Lead Location: 753859
Implantable Lead Location: 753860
Implantable Lead Model: 5076
Implantable Lead Model: 5076
Implantable Pulse Generator Implant Date: 20250424
Lead Channel Impedance Value: 361 Ohm
Lead Channel Impedance Value: 456 Ohm
Lead Channel Impedance Value: 456 Ohm
Lead Channel Impedance Value: 513 Ohm
Lead Channel Pacing Threshold Amplitude: 0.5 V
Lead Channel Pacing Threshold Amplitude: 0.75 V
Lead Channel Pacing Threshold Pulse Width: 0.4 ms
Lead Channel Pacing Threshold Pulse Width: 0.4 ms
Lead Channel Sensing Intrinsic Amplitude: 14.25 mV
Lead Channel Sensing Intrinsic Amplitude: 17.125 mV
Lead Channel Sensing Intrinsic Amplitude: 5.25 mV
Lead Channel Sensing Intrinsic Amplitude: 5.375 mV
Lead Channel Setting Pacing Amplitude: 1.5 V
Lead Channel Setting Pacing Amplitude: 2 V
Lead Channel Setting Pacing Pulse Width: 0.4 ms
Lead Channel Setting Sensing Sensitivity: 0.9 mV
Zone Setting Status: 755011
Zone Setting Status: 755011

## 2024-01-30 NOTE — Patient Instructions (Signed)

## 2024-01-30 NOTE — Progress Notes (Signed)
 Normal dual chamber pacemaker wound check. Presenting rhythm: AS/VS 85 . Wound well healed. Routine testing performed. Thresholds, sensing, and impedances consistent with pre-gen change measurements.  No episodes. No arm restrictions post gen change.  Pt enrolled in remote follow-up.

## 2024-04-16 ENCOUNTER — Ambulatory Visit: Admitting: Cardiovascular Disease

## 2024-04-17 ENCOUNTER — Ambulatory Visit

## 2024-04-17 DIAGNOSIS — R001 Bradycardia, unspecified: Secondary | ICD-10-CM

## 2024-04-20 LAB — CUP PACEART REMOTE DEVICE CHECK
Battery Remaining Longevity: 170 mo
Battery Voltage: 3.21 V
Brady Statistic AP VP Percent: 12.56 %
Brady Statistic AP VS Percent: 3.97 %
Brady Statistic AS VP Percent: 0.02 %
Brady Statistic AS VS Percent: 83.45 %
Brady Statistic RA Percent Paced: 16.53 %
Brady Statistic RV Percent Paced: 12.58 %
Date Time Interrogation Session: 20250725151831
Implantable Lead Connection Status: 753985
Implantable Lead Connection Status: 753985
Implantable Lead Implant Date: 20090127
Implantable Lead Implant Date: 20090127
Implantable Lead Location: 753859
Implantable Lead Location: 753860
Implantable Lead Model: 5076
Implantable Lead Model: 5076
Implantable Pulse Generator Implant Date: 20250424
Lead Channel Impedance Value: 323 Ohm
Lead Channel Impedance Value: 380 Ohm
Lead Channel Impedance Value: 418 Ohm
Lead Channel Impedance Value: 456 Ohm
Lead Channel Pacing Threshold Amplitude: 0.5 V
Lead Channel Pacing Threshold Amplitude: 0.75 V
Lead Channel Pacing Threshold Pulse Width: 0.4 ms
Lead Channel Pacing Threshold Pulse Width: 0.4 ms
Lead Channel Sensing Intrinsic Amplitude: 13.125 mV
Lead Channel Sensing Intrinsic Amplitude: 13.125 mV
Lead Channel Sensing Intrinsic Amplitude: 3.875 mV
Lead Channel Sensing Intrinsic Amplitude: 3.875 mV
Lead Channel Setting Pacing Amplitude: 1.5 V
Lead Channel Setting Pacing Amplitude: 2 V
Lead Channel Setting Pacing Pulse Width: 0.4 ms
Lead Channel Setting Sensing Sensitivity: 0.9 mV
Zone Setting Status: 755011
Zone Setting Status: 755011

## 2024-04-22 ENCOUNTER — Ambulatory Visit: Payer: Self-pay | Admitting: Cardiovascular Disease

## 2024-05-14 ENCOUNTER — Ambulatory Visit: Attending: Cardiovascular Disease | Admitting: Cardiovascular Disease

## 2024-05-14 ENCOUNTER — Encounter: Payer: Self-pay | Admitting: Cardiovascular Disease

## 2024-05-14 VITALS — BP 136/81 | HR 100 | Ht 68.0 in | Wt 185.6 lb

## 2024-05-14 DIAGNOSIS — Z950 Presence of cardiac pacemaker: Secondary | ICD-10-CM

## 2024-05-14 DIAGNOSIS — I10 Essential (primary) hypertension: Secondary | ICD-10-CM | POA: Diagnosis not present

## 2024-05-14 DIAGNOSIS — E109 Type 1 diabetes mellitus without complications: Secondary | ICD-10-CM | POA: Diagnosis not present

## 2024-05-14 DIAGNOSIS — R55 Syncope and collapse: Secondary | ICD-10-CM

## 2024-05-14 DIAGNOSIS — E7800 Pure hypercholesterolemia, unspecified: Secondary | ICD-10-CM | POA: Insufficient documentation

## 2024-05-14 DIAGNOSIS — I480 Paroxysmal atrial fibrillation: Secondary | ICD-10-CM | POA: Diagnosis not present

## 2024-05-14 NOTE — Progress Notes (Signed)
 Patient ID: Theodore [name], male   DOB: March 31, 1966, 58 y.o.   MRN: [id number]    Cardiology Office Note    Date:  05/14/2024   ID:  Theodore [name], DOB December 16, 1965, MRN [id number]  PCP:  [facility]  Cardiologist:   Jerel Balding, MD   Chief Complaint  Patient presents with   Pacemaker Check    History of Present Illness:  Theodore [name] is a 58 y.o. male with insulin -dependent diabetes mellitus and previous history of numerous episodes of syncope associated with prolonged sinus pauses, which completely resolved following implantation of a dual-chamber permanent pacemaker in 2009. He probably had neurally mediated syncope.  His device recorded a single episode of paroxysmal atrial fibrillation in December 2019 (around that time his mother had a stroke that was eventually fatal).  The arrhythmia lasted for over 19 hours and was not symptomatic and has not recurred since then.  This is his first office appointment with me since his pacemaker generator changeout procedure in April 2025.  He has not had any syncopal events since then.  He has had a lot of problems with his back and required another back surgery, with significant relief in his symptoms.  Glycemic control is markedly improved (he reports his most recent hemoglobin A1c was between 6 and 7%).  Pacemaker interrogation shows normal device function with over 5000 episodes of rate drop intervention.  While reviewing his rhythm in the office today seems that many of these interventions occurred due to post PAC pauses.  Reprogram the sudden bradycardia response intervention to be less aggressive today.  His current pacemaker generator is a Medtronic Azure XT DR MRI device with [id number] leads that were initially implanted in 2009.  All lead parameters appear to be in normal range.  He has roughly 17% atrial pacing and 13% ventricular pacing.  There have been no episodes of atrial fibrillation or other episodes of mode  switch.  Estimated generator longevity is over 14 years.  Theodore [name]'s triggers for syncope seem to be consistently related to the observation or discussion of health problems and medical procedures. For example he once passed out watching a person wearing oxygen mask and another time passed out discussing a newly diagnosed lung cancer in his uncle. His son has syncope associated with rapid palpitations, probably also neurally mediated.  He has insulin  requiring type 2 diabetes mellitus and systemic hypertension both of which appear to be well treated. He is minimally overweight. He had an echocardiogram in 2009 that was a completely normal study. At that time he also had cardiac catheterization that showed normal coronary arteries. 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 Lamar McQueen--10/21/2007--a Medtronic Adapta--serial number #[id number]   Pain in joint of left shoulder    Post laminectomy syndrome 07/07/2020   Radiculopathy of lumbar region    Syncope 09/2007   2D Echo--History of frequent episodes  of syncope beginning at 58 years of age--usually associated with some type of vagal issue but he does completely lose consciousness    Past Surgical History:  Procedure Laterality Date   ABDOMINAL EXPOSURE  09/08/2020   Procedure: ABDOMINAL EXPOSURE;  Surgeon: Burnetta Aures, MD;  Location: [facility] OR;  Service: Orthopedics;;   ANTERIOR LUMBAR FUSION N/A 09/08/2020   Procedure: Anterior  lumbar interbody fusion L5-S1, posterior spinal fusion interbody L5-S1;  Surgeon: Burnetta Aures, MD;  Location: [facility] OR;  Service: Orthopedics;  Laterality: N/A;  ; move other case to follow   CARDIAC CATHETERIZATION Right 10/20/2007   COLONOSCOPY     HAND SURGERY     HARVEST BONE GRAFT  09/08/2020   Procedure: HARVEST ILIAC BONE GRAFT;  Surgeon: Burnetta Aures, MD;  Location: [facility] OR;  Service: Orthopedics;;   LUMBAR LAMINECTOMY/DECOMPRESSION MICRODISCECTOMY N/A 06/05/2018   Procedure: Lumbar decompression L4-S1;  Surgeon: Burnetta Aures, MD;  Location: [facility] OR;  Service: Orthopedics;  Laterality: N/A;  3 hrs   PACEMAKER INSERTION  10/21/2007   Dr Herminio   [facility] GENERATOR CHANGEOUT N/A 01/16/2024   Procedure: PPM GENERATOR CHANGEOUT;  Surgeon: Francyne Headland, MD;  Location: [facility] INVASIVE CV LAB;  Service: Cardiovascular;  Laterality: N/A;   SHOULDER SURGERY  1999   arthroscopy   TONSILLECTOMY      Current Medications: Outpatient Medications Prior to Visit  Medication Sig Dispense Refill   atorvastatin  (LIPITOR) 10 MG tablet Take 10 mg by mouth daily.     Insulin  Aspart FlexPen (NOVOLOG ) 100 UNIT/ML Inject 6 Units into the skin 3 (three) times daily with meals.     Insulin  Glargine Solostar (LANTUS ) 100 UNIT/ML Solostar Pen Inject 25 Units into the skin in the morning.     Insulin  Pen Needle (B-D UF III MINI PEN NEEDLES) 31G X 5 MM MISC BD Ultra-Fine Short Pen Needle 31 gauge x 5/16  Use with Basaglar  Kwikpen 200 each 1   lisinopril  (ZESTRIL ) 5 MG tablet Take 1 tablet (5 mg total) by mouth daily. 90 tablet 1   No facility-administered medications prior to visit.     Allergies:   Cephalexin and Vancomycin    Family History:  The patient's family history includes Coronary artery disease (age of onset: 12's) in his mother; Diabetes in his mother and sister; Hypertension in his mother; Stroke in his mother.   ROS:   Please see the history of present illness.    ROS All other systems  reviewed and are negative.   PHYSICAL EXAM:   VS:  BP 136/81   Pulse 100   Ht 5' 8 (1.727 m)   Wt 185 lb 9.6 oz (84.2 kg)   SpO2 94%   BMI 28.22 kg/m      General: Alert, oriented x3, no distress, the left subclavian pacemaker site is well-healed. Head: no evidence of trauma, PERRL, EOMI, no exophtalmos or lid lag, no myxedema, no xanthelasma; normal ears, nose and oropharynx Neck: normal jugular venous pulsations and no hepatojugular reflux; brisk carotid pulses without delay and no carotid bruits Chest: clear to auscultation, no signs of consolidation by percussion or palpation, normal fremitus, symmetrical and full respiratory excursions Cardiovascular: normal position and quality of the apical impulse, regular rhythm, normal first and second heart sounds, no murmurs, rubs or gallops Abdomen: no tenderness or distention, no masses by palpation, no abnormal pulsatility or arterial bruits, normal bowel sounds, no hepatosplenomegaly Extremities: no clubbing, cyanosis or edema; 2+ radial, ulnar and brachial pulses bilaterally; 2+ right femoral, posterior  tibial and dorsalis pedis pulses; 2+ left femoral, posterior tibial and dorsalis pedis pulses; no subclavian or femoral bruits Neurological: grossly nonfocal Psych: Normal mood and affect     Wt Readings from Last 3 Encounters:  05/14/24 185 lb 9.6 oz (84.2 kg)  01/16/24 178 lb (80.7 kg)  12/26/23 182 lb 6.4 oz (82.7 kg)      Studies/Labs Reviewed:   EKG: Personally reviewed his most recent ECG from 04 03 2025, showing normal sinus rhythm/normal tracing.  Presenting rhythm today is A sensed V sensed (sinus rhythm with PACs).  EKG Interpretation Date/Time:    Ventricular Rate:    PR Interval:    QRS Duration:    QT Interval:    QTC Calculation:   R Axis:      Text Interpretation:          Comprehensive pacemaker check in the office today.  The rate response settings were made less aggressive, with a lower rate limit  of 40 bpm, rate drop of 25 bpm and intervention with pacing at 100 bpm for only 1 minute  Recent Labs: 01/16/2024: BUN 14; Creatinine, Ser 1.20; Hemoglobin 15.7; Platelets 134; Potassium 4.4; Sodium 138  12/11/2023 Cholesterol 139, triglycerides 81, HDL 42, LDL 81 Hemoglobin J8r 6.9%, creatinine 1.14, potassium 4, normal liver function tests  Lipid Panel    Component Value Date/Time   CHOL 181 08/14/2021 0916   CHOL 150 12/07/2019 0000   TRIG 182 (H) 08/14/2021 0916   TRIG 78 12/07/2019 0000   HDL 37 (L) 08/14/2021 0916   HDL 38 12/07/2019 0000   CHOLHDL 4.9 08/14/2021 0916   CHOLHDL 3.9 12/07/2019 0000   VLDL 72 (H) 10/22/2011 0507   LDLCALC 112 (H) 08/14/2021 0916      ASSESSMENT:    No diagnosis found.    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  The site is well-healed after recent generator change out.  Normal device function.  Reprogrammed sudden bradycardia response in a less aggressive fashion.  AFib: Was recorded only once in 2019 during a period of intense emotional distress.   No episodes of atrial mode switch since his last generator change out a few months ago.  Low embolic risk. CHADSVasc 1 (DM).  No history of TIA/stroke or systemic embolism.  Currently on aspirin . HTN: Blood pressure in desirable range.  He takes ACE inhibitors for renal protection rather than true hypertension.  Avoid diuretics. Syncope: None has occurred since his pacemaker generator change.  Whereas he previously had numerous syncopal events, since pacemaker implantation the only had one event around Christmas 2024, likely due to vasodepressor mechanism.  Over the years, his pacemaker has also recorded frequent episodes of sudden sinus tachycardia which may represent aborted neurally mediated syncope.  Sudden bradycardia response was turned on and appears to have been beneficial, but  was probably set a little too aggressively, reprogrammed today.   DM: Good glycemic control.  Although he initially started off as an insulin  resistant type II diabetic he has clearly progressed to type 1 diabetes and is very insulin  sensitive.  He now has excellent glycemic control with insulin .  I asked him to take only 10 units instead of 25 units of Lantus  insulin  on the morning of his procedure.  He will not receive any regular insulin  at lunchtime.  Will monitor closely for hypoglycemia around the time of his procedure.   HLP: Labs  are followed by PCP, most recently checked March 2025 with LDL cholesterol 81, normal triglycerides and borderline HDL at 42.  Target LDL less than 100 and non-HDL cholesterol less than 869 appear appropriate.  Continue atorvastatin .     Medication Adjustments/Labs and Tests Ordered: Current medicines are reviewed at length with the patient today.  Concerns regarding medicines are outlined above.  Medication changes, Labs and Tests ordered today are listed in the Patient Instructions below. Patient Instructions  Medication Instructions:  No changes *If you need a refill on your cardiac medications before your next appointment, please call your pharmacy*  Lab Work: None ordered If you have labs (blood work) drawn today and your tests are completely normal, you will receive your results only by: MyChart Message (if you have MyChart) OR A paper copy in the mail If you have any lab test that is abnormal or we need to change your treatment, we will call you to review the results.  Testing/Procedures: None ordered  Follow-Up: At [facility], you and your health needs are our priority.  As part of our continuing mission to provide you with exceptional heart care, our providers are all part of one team.  This team includes your primary Cardiologist (physician) and Advanced Practice Providers or APPs (Physician Assistants and Nurse Practitioners) who all  work together to provide you with the care you need, when you need it.  Your next appointment:   1 year(s)  Provider:   Jerel Balding, MD    We recommend signing up for the patient portal called MyChart.  Sign up information is provided on this After Visit Summary.  MyChart is used to connect with patients for Virtual Visits (Telemedicine).  Patients are able to view lab/test results, encounter notes, upcoming appointments, etc.  Non-urgent messages can be sent to your provider as well.   To learn more about what you can do with MyChart, go to [email or web address].          Signed, Jerel Balding, MD  05/14/2024 2:13 PM    [facility] [address] [place], [place], [place]  [address] Phone: [phone]; Fax: [phone]

## 2024-05-14 NOTE — Progress Notes (Signed)
 Patient ID: Theodore [name], male   DOB: 1966-08-17, 58 y.o.   MRN: [id number]    Cardiology Office Note    Date:  05/14/2024   ID:  [name], DOB [birth year], MRN [id number]  PCP:  [facility]  Cardiologist:   Jerel Balding, MD   No chief complaint on file.   History of Present Illness:  Theodore [name] is a 58 y.o. male with insulin -dependent diabetes mellitus and previous history of numerous episodes of syncope associated with prolonged sinus pauses, which completely resolved following implantation of a dual-chamber permanent pacemaker in 2009. He probably had neurally mediated syncope.  His device recorded a single episode of paroxysmal atrial fibrillation in December 2019 (around that time his mother had a stroke that was eventually fatal).  The arrhythmia lasted for over 19 hours and was not symptomatic and has not recurred since then.  His pacemaker has reached recommended replacement trigger.  He is here to discuss generator change out.  He had a syncopal event in the bathroom in late December and injured his coccyx.  He has recovered well from that.  We brought him back to the office to reprogram his pacemaker with more aggressive sudden bradycardia response.  It looks like not long after that his device reached RRT.  The device has switched to VVI mode at 65 bpm and cannot be reprogrammed.  The patient specifically denies any chest pain at rest or with exertion, dyspnea at rest or with exertion, orthopnea, paroxysmal nocturnal dyspnea, syncope, palpitations, focal neurological deficits, intermittent claudication, lower extremity edema, unexplained weight gain, cough, hemoptysis or wheezing.   Glycemic control has improved and his most recent hemoglobin A1c was 6.9%, the first time it has been less than 7% in several years.  He has normal renal parameters and electrolytes and normal liver function tests on labs performed 12/11/2023. Same day he had a lipid  profile showing HDL 42 and LDL 81, normal triglycerides.  His pacemaker is a Medtronic Adapta device implanted in 2009 with Medtronic [id number] atrial and ventricular leads.  He almost never requires pacing (usually around 7% atrial pacing and no ventricular pacing).  When last fully checked, the leads have excellent parameters: Atrial lead capture threshold 0.5 V at 0.4 ms, sensed P waves greater than 2.8 mV, atrial lead impedance 461 ohms, ventricular lead threshold 0.75 V at 0.4 ms, R waves greater than 16 mV, ventricular lead impedance 567 ohms.  Theodore [name]'s triggers for syncope seem to be consistently related to the observation or discussion of health problems and medical procedures. For example he once passed out watching a person wearing oxygen mask and another time passed out discussing a newly diagnosed lung cancer in his uncle. His son has syncope associated with rapid palpitations, probably also neurally mediated.  He has insulin  requiring type 2 diabetes mellitus and systemic hypertension both of which appear to be well treated. He is minimally overweight. He had an echocardiogram in 2009 that was a completely normal study. At that time he also had cardiac catheterization that showed normal coronary arteries. 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  Past Medical History:  Diagnosis Date   Anxiety    Arthritis    Bilateral elbow joint pain    Chronic back pain 03/18/2018   DDD (degenerative disc disease), lumbar    Depression    Diabetes mellitus    dx 2010 type 2   Lumbar  radiculopathy    Lumbar spine pain    Neurogenic claudication due to lumbar spinal stenosis    Neurogenic claudication due to lumbar spinal stenosis 05/10/2018   Pacemaker    Permanent pacemaker placement by Dr. Lamar McQueen--10/21/2007--a Medtronic Adapta--serial number #[id number]   Pain in joint of left shoulder    Post laminectomy syndrome  07/07/2020   Radiculopathy of lumbar region    Syncope 09/2007   2D Echo--History of frequent episodes of syncope beginning at 58 years of age--usually associated with some type of vagal issue but he does completely lose consciousness    Past Surgical History:  Procedure Laterality Date   ABDOMINAL EXPOSURE  09/08/2020   Procedure: ABDOMINAL EXPOSURE;  Surgeon: Burnetta Aures, MD;  Location: [facility] OR;  Service: Orthopedics;;   ANTERIOR LUMBAR FUSION N/A 09/08/2020   Procedure: Anterior lumbar interbody fusion L5-S1, posterior spinal fusion interbody L5-S1;  Surgeon: Burnetta Aures, MD;  Location: [facility] OR;  Service: Orthopedics;  Laterality: N/A;  ; move other case to follow   CARDIAC CATHETERIZATION Right 10/20/2007   COLONOSCOPY     HAND SURGERY     HARVEST BONE GRAFT  09/08/2020   Procedure: HARVEST ILIAC BONE GRAFT;  Surgeon: Burnetta Aures, MD;  Location: [facility] OR;  Service: Orthopedics;;   LUMBAR LAMINECTOMY/DECOMPRESSION MICRODISCECTOMY N/A 06/05/2018   Procedure: Lumbar decompression L4-S1;  Surgeon: Burnetta Aures, MD;  Location: [facility] OR;  Service: Orthopedics;  Laterality: N/A;  3 hrs   PACEMAKER INSERTION  10/21/2007   Dr Herminio   [facility] GENERATOR CHANGEOUT N/A 01/16/2024   Procedure: PPM GENERATOR CHANGEOUT;  Surgeon: Francyne Headland, MD;  Location: [facility] INVASIVE CV LAB;  Service: Cardiovascular;  Laterality: N/A;   SHOULDER SURGERY  1999   arthroscopy   TONSILLECTOMY      Current Medications: Outpatient Medications Prior to Visit  Medication Sig Dispense Refill   atorvastatin  (LIPITOR) 10 MG tablet Take 10 mg by mouth daily.     Insulin  Aspart FlexPen (NOVOLOG ) 100 UNIT/ML Inject 6 Units into the skin 3 (three) times daily with meals.     Insulin  Glargine Solostar (LANTUS ) 100 UNIT/ML Solostar Pen Inject 25 Units into the skin in the morning.     Insulin  Pen Needle (B-D UF III MINI PEN NEEDLES) 31G X 5 MM MISC BD Ultra-Fine Short Pen Needle 31 gauge x 5/16  Use with Basaglar   Kwikpen 200 each 1   lisinopril  (ZESTRIL ) 5 MG tablet Take 1 tablet (5 mg total) by mouth daily. 90 tablet 1   No facility-administered medications prior to visit.     Allergies:   Cephalexin and Vancomycin    Family History:  The patient's family history includes Coronary artery disease (age of onset: 59's) in his mother; Diabetes in his mother and sister; Hypertension in his mother; Stroke in his mother.   ROS:   Please see the history of present illness.    ROS All other systems reviewed and are negative.   PHYSICAL EXAM:   VS:  BP (!) 138/90 (BP Location: Left Arm, Patient Position: Sitting, Cuff Size: Normal)   Pulse 100   Ht 5' 8 (1.727 m)   Wt 185 lb 9.6 oz (84.2 kg)   SpO2 94%   BMI 28.22 kg/m      General: Alert, oriented x3, no distress, healthy pacemaker site Head: no evidence of trauma, PERRL, EOMI, no exophtalmos or lid lag, no myxedema, no xanthelasma; normal ears, nose and oropharynx Neck: normal jugular venous pulsations and no hepatojugular reflux; brisk carotid  pulses without delay and no carotid bruits Chest: clear to auscultation, no signs of consolidation by percussion or palpation, normal fremitus, symmetrical and full respiratory excursions Cardiovascular: normal position and quality of the apical impulse, regular rhythm, normal first and second heart sounds, no murmurs, rubs or gallops Abdomen: no tenderness or distention, no masses by palpation, no abnormal pulsatility or arterial bruits, normal bowel sounds, no hepatosplenomegaly Extremities: no clubbing, cyanosis or edema; 2+ radial, ulnar and brachial pulses bilaterally; 2+ right femoral, posterior tibial and dorsalis pedis pulses; 2+ left femoral, posterior tibial and dorsalis pedis pulses; no subclavian or femoral bruits Neurological: grossly nonfocal Psych: Normal mood and affect    Wt Readings from Last 3 Encounters:  05/14/24 185 lb 9.6 oz (84.2 kg)  01/16/24 178 lb (80.7 kg)  12/26/23 182 lb  6.4 oz (82.7 kg)      Studies/Labs Reviewed:   EKG:    EKG Interpretation Date/Time:    Ventricular Rate:    PR Interval:    QRS Duration:    QT Interval:    QTC Calculation:   R Axis:      Text Interpretation:           Comprehensive pacemaker check in the office today.  His device has switched to VVI fixed pacing at 65 bpm and only limited interrogation of the ventricular lead is possible.  Unable to reprogram the device.  Recent Labs: 01/16/2024: BUN 14; Creatinine, Ser 1.20; Hemoglobin 15.7; Platelets 134; Potassium 4.4; Sodium 138  12/11/2023 Cholesterol 139, triglycerides 81, HDL 42, LDL 81 Hemoglobin J8r 6.9%, creatinine 1.14, potassium 4, normal liver function tests  Lipid Panel    Component Value Date/Time   CHOL 181 08/14/2021 0916   CHOL 150 12/07/2019 0000   TRIG 182 (H) 08/14/2021 0916   TRIG 78 12/07/2019 0000   HDL 37 (L) 08/14/2021 0916   HDL 38 12/07/2019 0000   CHOLHDL 4.9 08/14/2021 0916   CHOLHDL 3.9 12/07/2019 0000   VLDL 72 (H) 10/22/2011 0507   LDLCALC 112 (H) 08/14/2021 0916      ASSESSMENT:    No diagnosis found.    PLAN:  In order of problems listed above:  Pacemaker at ERI : He is not pacemaker dependent and only requires brief intermittent pacing for episodes of neurocardiogenic pauses.  Will schedule him for pacemaker generator change out.  Unable to fully check his AMV leads today, but they had excellent parameters at the last for pacemaker check.  The risks and benefits of the procedure and in particular risk of infection were discussed with him in detail.  He understands and agrees to proceed.  We have recently performed chemistry labs.  I prefer not to have them draw CBC since he is so prone to passing out with medical procedures.   AFib: None has occurred in the last 6 years.  He had a single episode of atrial fibrillation recorded by his pacemaker in 2019, during a period of intense emotional distress, none since.   Low  embolic risk. CHADSVasc 1 (DM).  No history of TIA/stroke or systemic embolism.  Currently on aspirin . HTN: He does not really have high blood pressure.  He takes lisinopril  low-dose for renal protection.  He should not receive diuretics due to his risk for syncope. Syncope: Whereas he previously had numerous syncopal events, since pacemaker implantation the only had one event around Christmas 2024, likely due to vasodepressor mechanism.  Over the years, his pacemaker has also recorded frequent episodes of  sudden sinus tachycardia which may represent aborted neurally mediated syncope.  Will make sure he has sudden bradycardia response turned on in his device settings.  His history is highly suggestive of neurally mediated cardioinhibitory syncope.   DM: Although he initially started off as an insulin  resistant type II diabetic he has clearly progressed to type 1 diabetes and is very insulin  sensitive.  He now has excellent glycemic control with insulin .  I asked him to take only 10 units instead of 25 units of Lantus  insulin  on the morning of his procedure.  He will not receive any regular insulin  at lunchtime.  Will monitor closely for hypoglycemia around the time of his procedure.   HLP: All parameters are in target range on atorvastatin ..  Continue.     Medication Adjustments/Labs and Tests Ordered: Current medicines are reviewed at length with the patient today.  Concerns regarding medicines are outlined above.  Medication changes, Labs and Tests ordered today are listed in the Patient Instructions below. There are no Patient Instructions on file for this visit.   Signed, Jerel Balding, MD  05/14/2024 8:36 AM    [facility] [address] [place], [place], [place]  [address] Phone: [phone]; Fax: [phone]

## 2024-05-14 NOTE — Patient Instructions (Signed)

## 2024-06-15 NOTE — Progress Notes (Shared)
 [facility] - Clinic Note  06/24/2024     CHIEF COMPLAINT Patient presents for No chief complaint on file.    HISTORY OF PRESENT ILLNESS: EVERET FLAGG is a 58 y.o. male who presents to the clinic today for:    Pt states   Referring physician: [facility] [address] [place],  [place] [address]  HISTORICAL INFORMATION:   Selected notes from the MEDICAL RECORD NUMBER Referred by Suzen Lesches, FNP for DM exam LEE:  Ocular Hx- PMH-anxiety, arthritis, depression, pacemaker, DM (A1c: 7.4 [03.20], takes metformin , basaglar )   CURRENT MEDICATIONS: No current outpatient medications on file. (Ophthalmic Drugs)   No current facility-administered medications for this visit. (Ophthalmic Drugs)   Current Outpatient Medications (Other)  Medication Sig   atorvastatin  (LIPITOR) 10 MG tablet Take 10 mg by mouth daily.   Insulin  Aspart FlexPen (NOVOLOG ) 100 UNIT/ML Inject 6 Units into the skin 3 (three) times daily with meals.   Insulin  Glargine Solostar (LANTUS ) 100 UNIT/ML Solostar Pen Inject 25 Units into the skin in the morning.   Insulin  Pen Needle (B-D UF III MINI PEN NEEDLES) 31G X 5 MM MISC BD Ultra-Fine Short Pen Needle 31 gauge x 5/16  Use with Basaglar  Kwikpen   lisinopril  (ZESTRIL ) 5 MG tablet Take 1 tablet (5 mg total) by mouth daily.   No current facility-administered medications for this visit. (Other)   REVIEW OF SYSTEMS:    ALLERGIES Allergies  Allergen Reactions   Cephalexin Hives   Vancomycin  Hives, Itching and Other (See Comments)    True Allergic Reaction -- Not Red Man's Syndrome Onset of prominent red rash, severe itching/hives Not responsive to Benadryl  and IV rate reduction. First seen pre-op for surgery on 06/05/18.   PAST MEDICAL HISTORY Past Medical History:  Diagnosis Date   Anxiety    Arthritis    Bilateral elbow joint pain    Chronic back pain 03/18/2018   DDD (degenerative disc disease),  lumbar    Depression    Diabetes mellitus    dx 2010 type 2   Lumbar radiculopathy    Lumbar spine pain    Neurogenic claudication due to lumbar spinal stenosis    Neurogenic claudication due to lumbar spinal stenosis 05/10/2018   Pacemaker    Permanent pacemaker placement by Dr. Lamar McQueen--10/21/2007--a Medtronic Adapta--serial number #[id number]   Pain in joint of left shoulder    Post laminectomy syndrome 07/07/2020   Radiculopathy of lumbar region    Syncope 09/2007   2D Echo--History of frequent episodes of syncope beginning at 58 years of age--usually associated with some type of vagal issue but he does completely lose consciousness   Past Surgical History:  Procedure Laterality Date   ABDOMINAL EXPOSURE  09/08/2020   Procedure: ABDOMINAL EXPOSURE;  Surgeon: Burnetta Aures, MD;  Location: [facility] OR;  Service: Orthopedics;;   ANTERIOR LUMBAR FUSION N/A 09/08/2020   Procedure: Anterior lumbar interbody fusion L5-S1, posterior spinal fusion interbody L5-S1;  Surgeon: Burnetta Aures, MD;  Location: [facility] OR;  Service: Orthopedics;  Laterality: N/A;  ; move other case to follow   CARDIAC CATHETERIZATION Right 10/20/2007   COLONOSCOPY     HAND SURGERY     HARVEST BONE GRAFT  09/08/2020   Procedure: HARVEST ILIAC BONE GRAFT;  Surgeon: Burnetta Aures, MD;  Location: [facility] OR;  Service: Orthopedics;;   LUMBAR LAMINECTOMY/DECOMPRESSION MICRODISCECTOMY N/A 06/05/2018   Procedure: Lumbar decompression L4-S1;  Surgeon: Burnetta Aures, MD;  Location: [facility] OR;  Service: Orthopedics;  Laterality: N/A;  3 hrs   PACEMAKER INSERTION  10/21/2007   Dr Herminio   [facility] GENERATOR CHANGEOUT N/A 01/16/2024   Procedure: PPM GENERATOR CHANGEOUT;  Surgeon: Francyne Headland, MD;  Location: [facility] INVASIVE CV LAB;  Service: Cardiovascular;  Laterality: N/A;   SHOULDER SURGERY  1999   arthroscopy   TONSILLECTOMY     FAMILY HISTORY Family History  Problem Relation Age of Onset   Diabetes Sister        type 2    Diabetes Mother        type 2   Coronary artery disease Mother 29's   Hypertension Mother    Stroke Mother    SOCIAL HISTORY Social History   Tobacco Use   Smoking status: Former    Current packs/day: 0.00    Average packs/day: 1 pack/day for 30.0 years (30.0 ttl pk-yrs)    Types: Cigarettes    Start date: 10/16/1977    Quit date: 10/17/2007    Years since quitting: 16.6   Smokeless tobacco: Former    Types: Chew    Quit date: 2020   Tobacco comments:    occasionally uses chewing tobacco but not on a regular basic  Vaping Use   Vaping status: Never Used  Substance Use Topics   Alcohol use: No   Drug use: No       OPHTHALMIC EXAM: Not recorded    IMAGING AND PROCEDURES  Imaging and Procedures for @TODAY @          ASSESSMENT/PLAN:  No diagnosis found.  1,2. Diabetes mellitus, type 2 without retinopathy OU  - A1c 7.2% on 08.19.24, 7.4% on 11.21.22, 9.0% on 07.12.22 from 7.4% in 03.11.20  - The incidence, risk factors for progression, natural history and treatment options for diabetic retinopathy were discussed with patient.    - The need for close monitoring of blood glucose, blood pressure, and serum lipids, avoiding cigarette or any type of tobacco, and the need for long term follow up was also discussed with patient.  - f/u in 1 year, sooner prn  3,4. Hypertensive retinopathy OU  - discussed importance of tight BP control  - monitor  5. Mixed form age related cataracts OU  - The symptoms of cataract, surgical options, and treatments and risks were discussed with patient.  - discussed diagnosis and progression  - monitor  Ophthalmic Meds Ordered this visit:  No orders of the defined types were placed in this encounter.     No follow-ups on file.  There are no Patient Instructions on file for this visit.  This document serves as a record of services personally performed by Redell JUDITHANN Hans, MD, PhD. It was created on their behalf by Almetta Pesa,  an ophthalmic technician. The creation of this record is the provider's dictation and/or activities during the visit.    Electronically signed by: Almetta Pesa, OA, 06/15/24  9:54 AM   Redell JUDITHANN Hans, M.D., Ph.D. Diseases & Surgery of the Retina and Vitreous [facility]   I have reviewed the above documentation for accuracy and completeness, and I agree with the above. Redell JUDITHANN Hans, M.D., Ph.D. 06/28/23 9:54 AM  Abbreviations: M myopia (nearsighted); A astigmatism; H hyperopia (farsighted); P presbyopia; Mrx spectacle prescription;  CTL contact lenses; OD right eye; OS left eye; OU both eyes  XT exotropia; ET esotropia; PEK punctate epithelial keratitis; PEE punctate epithelial erosions; DES dry eye syndrome; MGD meibomian gland dysfunction; ATs artificial tears;  PFAT's preservative free artificial tears; NSC nuclear sclerotic cataract; PSC posterior subcapsular cataract; ERM epi-retinal membrane; PVD posterior vitreous detachment; RD retinal detachment; DM diabetes mellitus; DR diabetic retinopathy; NPDR non-proliferative diabetic retinopathy; PDR proliferative diabetic retinopathy; CSME clinically significant macular edema; DME diabetic macular edema; dbh dot blot hemorrhages; CWS cotton wool spot; POAG primary open angle glaucoma; C/D cup-to-disc ratio; HVF humphrey visual field; GVF goldmann visual field; OCT optical coherence tomography; IOP intraocular pressure; BRVO Branch retinal vein occlusion; CRVO central retinal vein occlusion; CRAO central retinal artery occlusion; BRAO branch retinal artery occlusion; RT retinal tear; SB scleral buckle; PPV pars plana vitrectomy; VH Vitreous hemorrhage; PRP panretinal laser photocoagulation; IVK intravitreal kenalog ; VMT vitreomacular traction; MH Macular hole;  NVD neovascularization of the disc; NVE neovascularization elsewhere; AREDS age related eye disease study; ARMD age related macular degeneration; POAG primary open  angle glaucoma; EBMD epithelial/anterior basement membrane dystrophy; ACIOL anterior chamber intraocular lens; IOL intraocular lens; PCIOL posterior chamber intraocular lens; Phaco/IOL phacoemulsification with intraocular lens placement; PRK photorefractive keratectomy; LASIK laser assisted in situ keratomileusis; HTN hypertension; DM diabetes mellitus; COPD chronic obstructive pulmonary disease

## 2024-06-24 ENCOUNTER — Encounter (INDEPENDENT_AMBULATORY_CARE_PROVIDER_SITE_OTHER): Payer: BC Managed Care – PPO | Admitting: Ophthalmology

## 2024-06-24 DIAGNOSIS — Z794 Long term (current) use of insulin: Secondary | ICD-10-CM

## 2024-06-24 DIAGNOSIS — H25813 Combined forms of age-related cataract, bilateral: Secondary | ICD-10-CM

## 2024-06-24 DIAGNOSIS — I10 Essential (primary) hypertension: Secondary | ICD-10-CM

## 2024-06-24 DIAGNOSIS — H35033 Hypertensive retinopathy, bilateral: Secondary | ICD-10-CM

## 2024-06-24 DIAGNOSIS — E119 Type 2 diabetes mellitus without complications: Secondary | ICD-10-CM

## 2024-06-24 NOTE — Progress Notes (Signed)
 [facility] - Clinic Note  07/06/2024     CHIEF COMPLAINT Patient presents for Retina Follow Up    HISTORY OF PRESENT ILLNESS: Theodore [name] is a 58 y.o. male who presents to the clinic today for:   HPI     Retina Follow Up   Patient presents with  Diabetic Retinopathy.  In both eyes.  This started 1 year ago.  Duration of 1 year.  Since onset it is stable.  I, the attending physician,  performed the HPI with the patient and updated documentation appropriately.        Comments   Pt is having a delay in focus when going from looking at his phone to looking into the distance. last A1C 6.9 3 weeks ago BS=212 this morning but on average is 180       Last edited by Valdemar Rogue, MD on 07/12/2024  2:34 PM.     Pt states the vision is the same.  Referring physician: Practice, [facility] [address] [place],  [place] [address]  HISTORICAL INFORMATION:   Selected notes from the MEDICAL RECORD NUMBER Referred by Suzen Lesches, FNP for DM exam LEE:  Ocular Hx- PMH-anxiety, arthritis, depression, pacemaker, DM (A1c: 7.4 [03.20], takes metformin , basaglar )   CURRENT MEDICATIONS: No current outpatient medications on file. (Ophthalmic Drugs)   No current facility-administered medications for this visit. (Ophthalmic Drugs)   Current Outpatient Medications (Other)  Medication Sig   atorvastatin  (LIPITOR) 10 MG tablet Take 10 mg by mouth daily.   Insulin  Aspart FlexPen (NOVOLOG ) 100 UNIT/ML Inject 6 Units into the skin 3 (three) times daily with meals.   Insulin  Glargine Solostar (LANTUS ) 100 UNIT/ML Solostar Pen Inject 25 Units into the skin in the morning.   Insulin  Pen Needle (B-D UF III MINI PEN NEEDLES) 31G X 5 MM MISC BD Ultra-Fine Short Pen Needle 31 gauge x 5/16  Use with Basaglar  Kwikpen   lisinopril  (ZESTRIL ) 5 MG tablet Take 1 tablet (5 mg total) by mouth daily.   No current facility-administered medications for this visit.  (Other)   REVIEW OF SYSTEMS: ROS   Positive for: Endocrine, Cardiovascular, Eyes Negative for: Constitutional, Gastrointestinal, Neurological, Skin, Genitourinary, Musculoskeletal, HENT, Respiratory, Psychiatric, Allergic/Imm, Heme/Lymph Last edited by Elnor Avelina RAMAN, COT on 07/06/2024  1:17 PM.       ALLERGIES Allergies  Allergen Reactions   Cephalexin Hives   Vancomycin  Hives, Itching and Other (See Comments)    True Allergic Reaction -- Not Red Man's Syndrome Onset of prominent red rash, severe itching/hives Not responsive to Benadryl  and IV rate reduction. First seen pre-op for surgery on 06/05/18.   PAST MEDICAL HISTORY Past Medical History:  Diagnosis Date   Anxiety    Arthritis    Bilateral elbow joint pain    Chronic back pain 03/18/2018   DDD (degenerative disc disease), lumbar    Depression    Diabetes mellitus    dx 2010 type 2   Lumbar radiculopathy    Lumbar spine pain    Neurogenic claudication due to lumbar spinal stenosis    Neurogenic claudication due to lumbar spinal stenosis 05/10/2018   Pacemaker    Permanent pacemaker placement by Dr. Lamar McQueen--10/21/2007--a [employer] Adapta--serial number #[id number]   Pain in joint of left shoulder    Post laminectomy syndrome 07/07/2020   Radiculopathy of lumbar region    Syncope 09/2007   2D Echo--History of frequent episodes of syncope beginning at 58 years of  age--usually associated with some type of vagal issue but he does completely lose consciousness   Past Surgical History:  Procedure Laterality Date   ABDOMINAL EXPOSURE  09/08/2020   Procedure: ABDOMINAL EXPOSURE;  Surgeon: Burnetta Aures, MD;  Location: [facility] OR;  Service: Orthopedics;;   ANTERIOR LUMBAR FUSION N/A 09/08/2020   Procedure: Anterior lumbar interbody fusion L5-S1, posterior spinal fusion interbody L5-S1;  Surgeon: Burnetta Aures, MD;  Location: [facility] OR;  Service: Orthopedics;  Laterality: N/A;  ; move other case to follow    CARDIAC CATHETERIZATION Right 10/20/2007   COLONOSCOPY     HAND SURGERY     HARVEST BONE GRAFT  09/08/2020   Procedure: HARVEST ILIAC BONE GRAFT;  Surgeon: Burnetta Aures, MD;  Location: [facility] OR;  Service: Orthopedics;;   LUMBAR LAMINECTOMY/DECOMPRESSION MICRODISCECTOMY N/A 06/05/2018   Procedure: Lumbar decompression L4-S1;  Surgeon: Burnetta Aures, MD;  Location: [facility] OR;  Service: Orthopedics;  Laterality: N/A;  3 hrs   PACEMAKER INSERTION  10/21/2007   Dr Herminio   [facility] GENERATOR CHANGEOUT N/A 01/16/2024   Procedure: PPM GENERATOR CHANGEOUT;  Surgeon: Francyne Headland, MD;  Location: [facility] INVASIVE CV LAB;  Service: Cardiovascular;  Laterality: N/A;   SHOULDER SURGERY  1999   arthroscopy   TONSILLECTOMY     FAMILY HISTORY Family History  Problem Relation Age of Onset   Diabetes Sister        type 2   Diabetes Mother        type 2   Coronary artery disease Mother 56's   Hypertension Mother    Stroke Mother    SOCIAL HISTORY Social History   Tobacco Use   Smoking status: Former    Current packs/day: 0.00    Average packs/day: 1 pack/day for 30.0 years (30.0 ttl pk-yrs)    Types: Cigarettes    Start date: 10/16/1977    Quit date: 10/17/2007    Years since quitting: 16.7   Smokeless tobacco: Former    Types: Chew    Quit date: 2020   Tobacco comments:    occasionally uses chewing tobacco but not on a regular basic  Vaping Use   Vaping status: Never Used  Substance Use Topics   Alcohol use: No   Drug use: No       OPHTHALMIC EXAM: Base Eye Exam     Visual Acuity (Snellen - Linear)       Right Left   Dist [place] 20/20 20/20         Tonometry (Tonopen, 1:21 PM)       Right Left   Pressure 19 16         Pupils       Pupils Dark Light Shape React APD   Right PERRL 3 2 Round Brisk None   Left PERRL 3 2 Round Brisk None         Visual Fields       Left Right    Full Full         Extraocular Movement       Right Left    Full, Ortho Full, Ortho          Neuro/Psych     Oriented x3: Yes   Mood/Affect: Normal         Dilation     Both eyes: 1.0% Mydriacyl, 2.5% Phenylephrine  @ 1:21 PM           Slit Lamp and Fundus Exam     Slit Lamp Exam  Right Left   Lids/Lashes Dermatochalasis - upper lid Dermatochalasis - upper lid   Conjunctiva/Sclera White and quiet White and quiet   Cornea mild Arcus, mild tear film debris mild Arcus, mild tear film debris   Anterior Chamber Deep and quiet Deep and quiet   Iris Round and dilated, No NVI Round and moderately dilated, No NVI   Lens 2+ Nuclear sclerosis, 2+ Cortical cataract 2+ Nuclear sclerosis, 2+ Cortical cataract   Anterior Vitreous mild Vitreous syneresis mild Vitreous syneresis         Fundus Exam       Right Left   Disc Compact, Pink and Sharp Pink and Sharp, Compact   C/D Ratio 0.1 0.2   Macula Flat, Blunted foveal reflex, mild RPE mottling and clumping, No heme or edema Flat, good foveal reflex, mild RPE mottling and clumping, No heme or edema   Vessels Vascular attenuation, Tortuous Vascular attenuation, Tortuous   Periphery Attached, focal IRH just outside ST arcades, mild reticular degeneration, focal pavingstone inferiorly at 0600, no heme Attached, reticular degeneration, pigmented CR scar with atrophy at 0730, pigmented pavingstone inferiorly, No heme           IMAGING AND PROCEDURES  Imaging and Procedures for @TODAY @  OCT, Retina - OU - Both Eyes       Right Eye Quality was good. Central Foveal Thickness: 281. Progression has been stable. Findings include normal foveal contour, no IRF, no SRF, vitreomacular adhesion .   Left Eye Quality was good. Central Foveal Thickness: 285. Progression has been stable. Findings include normal foveal contour, no IRF, no SRF, vitreomacular adhesion .   Notes *Images captured and stored on drive  Diagnosis / Impression:  NFP, no IRF/SRF OU No DME OU  Clinical management:  See below  Abbreviations: NFP  - Normal foveal profile. CME - cystoid macular edema. PED - pigment epithelial detachment. IRF - intraretinal fluid. SRF - subretinal fluid. EZ - ellipsoid zone. ERM - epiretinal membrane. ORA - outer retinal atrophy. ORT - outer retinal tubulation. SRHM - subretinal hyper-reflective material            ASSESSMENT/PLAN:    ICD-10-CM   1. Diabetes mellitus type 2 without retinopathy (HCC)  E11.9 OCT, Retina - OU - Both Eyes    2. Current use of insulin  (HCC)  Z79.4     3. Essential hypertension  I10     4. Hypertensive retinopathy of both eyes  H35.033     5. Combined forms of age-related cataract of both eyes  H25.813      1,2. Diabetes mellitus, type 2 without retinopathy OU  - A1c 6.9 (09.22.25), 7.2 (08.19.24), 7.4% (11.21.22) - The incidence, risk factors for progression, natural history and treatment options for diabetic retinopathy were discussed with patient.   - The need for close monitoring of blood glucose, blood pressure, and serum lipids, avoiding cigarette or any type of tobacco, and the need for long term follow up was also discussed with patient.  - f/u in 1 year, sooner prn  3,4. Hypertensive retinopathy OU  - discussed importance of tight BP control  - monitor  5. 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  - discussed diagnosis and progression  - monitor  Ophthalmic Meds Ordered this visit:  No orders of the defined types were placed in this encounter.     Return in about 1 year (around 07/06/2025) for  f/u DM exam, DFE, OCT.  There are no Patient Instructions on file for this visit.  This document serves as a record of services personally performed by Redell JUDITHANN Hans, MD, PhD. It was created on their behalf by Almetta Pesa, an ophthalmic technician. The creation of this record is the provider's dictation and/or activities during the visit.     Electronically signed by: Almetta Pesa, OA, 07/12/24  2:34 PM  This document serves as a record of services personally performed by Redell JUDITHANN Hans, MD, PhD. It was created on their behalf by Wanda GEANNIE Keens, COT an ophthalmic technician. The creation of this record is the provider's dictation and/or activities during the visit.    Electronically signed by:  Wanda GEANNIE Keens, COT  07/12/24 2:34 PM  Redell JUDITHANN Hans, M.D., Ph.D. Diseases & Surgery of the Retina and Vitreous [facility]   I have reviewed the above documentation for accuracy and completeness, and I agree with the above. Redell JUDITHANN Hans, M.D., Ph.D. 07/12/24 2:35 PM   Abbreviations: M myopia (nearsighted); A astigmatism; H hyperopia (farsighted); P presbyopia; Mrx spectacle prescription;  CTL contact lenses; OD right eye; OS left eye; OU both eyes  XT exotropia; ET esotropia; PEK punctate epithelial keratitis; PEE punctate epithelial erosions; DES dry eye syndrome; MGD meibomian gland dysfunction; ATs artificial tears; PFAT's preservative free artificial tears; NSC nuclear sclerotic cataract; PSC posterior subcapsular cataract; ERM epi-retinal membrane; PVD posterior vitreous detachment; RD retinal detachment; DM diabetes mellitus; DR diabetic retinopathy; NPDR non-proliferative diabetic retinopathy; PDR proliferative diabetic retinopathy; CSME clinically significant macular edema; DME diabetic macular edema; dbh dot blot hemorrhages; CWS cotton wool spot; POAG primary open angle glaucoma; C/D cup-to-disc ratio; HVF humphrey visual field; GVF goldmann visual field; OCT optical coherence tomography; IOP intraocular pressure; BRVO Branch retinal vein occlusion; CRVO central retinal vein occlusion; CRAO central retinal artery occlusion; BRAO branch retinal artery occlusion; RT retinal tear; SB scleral buckle; PPV pars plana vitrectomy; VH Vitreous hemorrhage; PRP panretinal laser photocoagulation; IVK  intravitreal kenalog ; VMT vitreomacular traction; MH Macular hole;  NVD neovascularization of the disc; NVE neovascularization elsewhere; AREDS age related eye disease study; ARMD age related macular degeneration; POAG primary open angle glaucoma; EBMD epithelial/anterior basement membrane dystrophy; ACIOL anterior chamber intraocular lens; IOL intraocular lens; PCIOL posterior chamber intraocular lens; Phaco/IOL phacoemulsification with intraocular lens placement; PRK photorefractive keratectomy; LASIK laser assisted in situ keratomileusis; HTN hypertension; DM diabetes mellitus; COPD chronic obstructive pulmonary disease

## 2024-06-25 NOTE — Progress Notes (Signed)
 Remote PPM Transmission

## 2024-07-06 ENCOUNTER — Ambulatory Visit (INDEPENDENT_AMBULATORY_CARE_PROVIDER_SITE_OTHER): Admitting: Ophthalmology

## 2024-07-06 DIAGNOSIS — Z794 Long term (current) use of insulin: Secondary | ICD-10-CM

## 2024-07-06 DIAGNOSIS — H25813 Combined forms of age-related cataract, bilateral: Secondary | ICD-10-CM

## 2024-07-06 DIAGNOSIS — H35033 Hypertensive retinopathy, bilateral: Secondary | ICD-10-CM

## 2024-07-06 DIAGNOSIS — E119 Type 2 diabetes mellitus without complications: Secondary | ICD-10-CM

## 2024-07-06 DIAGNOSIS — I10 Essential (primary) hypertension: Secondary | ICD-10-CM | POA: Diagnosis not present

## 2024-07-08 ENCOUNTER — Telehealth: Payer: Self-pay | Admitting: Cardiovascular Disease

## 2024-07-08 NOTE — Telephone Encounter (Signed)
 Returned call to patient regarding previous message about missing a device check. As of now, there has not been any missed transmissions. The next transmission is scheduled for 07/17/2024. Patient is aware. Educated on how & when the monitor connects w/ patient device and sends transmissions. Patient verbalized understanding.   Informed to contact device clinic for any further questions or concerns. Appreciative for call.

## 2024-07-08 NOTE — Telephone Encounter (Signed)
 Pt returning a call. Did not see a note. Please advise.

## 2024-07-08 NOTE — Telephone Encounter (Signed)
 Spoke with pt, he said he got a message that he had missed a device check. He reports he is out of the house by 5 am in the mornings and wants to know if that time can be changed.

## 2024-07-08 NOTE — Telephone Encounter (Signed)
 Patient's wife would like to know if there is a medical supply company that they can use for his Omnipod and Dexcom. She has contacted the insurance provider and has been informed that it will be cheaper if they can file it through a medical supply company versus the pharmacy.      [name] can be reached at [phone]

## 2024-07-12 ENCOUNTER — Encounter (INDEPENDENT_AMBULATORY_CARE_PROVIDER_SITE_OTHER): Payer: Self-pay | Admitting: Ophthalmology

## 2024-07-17 ENCOUNTER — Encounter

## 2024-07-23 ENCOUNTER — Telehealth: Payer: Self-pay | Admitting: Cardiovascular Disease

## 2024-07-23 NOTE — Telephone Encounter (Signed)
 Patient stated he tried to send a transmission yesterday but believes his adapter is not working.  Patient stated he will try again to send a transmission today when he gets a new adapter.

## 2024-07-24 NOTE — Telephone Encounter (Signed)
 Pt called back letting us  know [employer] is sending out a new piece of equipment for the monitor. Will check back 7-10 business days

## 2024-07-24 NOTE — Telephone Encounter (Signed)
 Lvm for pt to call back.

## 2024-08-12 NOTE — Telephone Encounter (Signed)
 Spoke with pt and he states that it is not the adapter its the monitor itself. Pt states that he has been 11-12hr days and its hard for him to come up here and get one. Pt will call back today and let us  know when he can come

## 2024-08-18 NOTE — Telephone Encounter (Signed)
 Pt came in and got his new monitor

## 2024-08-18 NOTE — Telephone Encounter (Signed)
 Pt states he would like to come in the office today and receive a new monitor. See previous phone note.

## 2024-10-16 ENCOUNTER — Ambulatory Visit

## 2024-10-16 DIAGNOSIS — I480 Paroxysmal atrial fibrillation: Secondary | ICD-10-CM

## 2024-10-16 LAB — CUP PACEART REMOTE DEVICE CHECK
Battery Remaining Longevity: 166 mo
Battery Voltage: 3.16 V
Brady Statistic AP VP Percent: 7.4 %
Brady Statistic AP VS Percent: 3.05 %
Brady Statistic AS VP Percent: 0.03 %
Brady Statistic AS VS Percent: 89.53 %
Brady Statistic RA Percent Paced: 10.44 %
Brady Statistic RV Percent Paced: 7.42 %
Date Time Interrogation Session: 20260123004332
Implantable Lead Connection Status: 753985
Implantable Lead Connection Status: 753985
Implantable Lead Implant Date: 20090127
Implantable Lead Implant Date: 20090127
Implantable Lead Location: 753859
Implantable Lead Location: 753860
Implantable Lead Model: 5076
Implantable Lead Model: 5076
Implantable Pulse Generator Implant Date: 20250424
Lead Channel Impedance Value: 323 Ohm
Lead Channel Impedance Value: 399 Ohm
Lead Channel Impedance Value: 418 Ohm
Lead Channel Impedance Value: 437 Ohm
Lead Channel Pacing Threshold Amplitude: 0.5 V
Lead Channel Pacing Threshold Amplitude: 0.75 V
Lead Channel Pacing Threshold Pulse Width: 0.4 ms
Lead Channel Pacing Threshold Pulse Width: 0.4 ms
Lead Channel Sensing Intrinsic Amplitude: 13 mV
Lead Channel Sensing Intrinsic Amplitude: 13 mV
Lead Channel Sensing Intrinsic Amplitude: 4.375 mV
Lead Channel Sensing Intrinsic Amplitude: 4.375 mV
Lead Channel Setting Pacing Amplitude: 1.5 V
Lead Channel Setting Pacing Amplitude: 2 V
Lead Channel Setting Pacing Pulse Width: 0.4 ms
Lead Channel Setting Sensing Sensitivity: 0.9 mV
Zone Setting Status: 755011
Zone Setting Status: 755011

## 2024-10-18 ENCOUNTER — Ambulatory Visit: Payer: Self-pay | Admitting: Cardiovascular Disease

## 2024-10-19 NOTE — Progress Notes (Signed)
 Remote PPM Transmission

## 2025-01-15 ENCOUNTER — Encounter

## 2025-04-16 ENCOUNTER — Encounter

## 2025-07-12 ENCOUNTER — Encounter (INDEPENDENT_AMBULATORY_CARE_PROVIDER_SITE_OTHER): Admitting: Ophthalmology

## 2025-07-16 ENCOUNTER — Encounter

## 2025-10-15 ENCOUNTER — Encounter

## 2026-01-14 ENCOUNTER — Encounter
